# Patient Record
Sex: Female | Born: 1935 | Race: White | Hispanic: No | Smoking: Never smoker
Health system: Southern US, Community
[De-identification: ages and names within clinical notes are randomized; demographics above are authoritative.]

## PROBLEM LIST (undated history)

## (undated) DIAGNOSIS — D696 Thrombocytopenia, unspecified: Secondary | ICD-10-CM

## (undated) DIAGNOSIS — I350 Nonrheumatic aortic (valve) stenosis: Secondary | ICD-10-CM

## (undated) DIAGNOSIS — R011 Cardiac murmur, unspecified: Secondary | ICD-10-CM

## (undated) DIAGNOSIS — E7800 Pure hypercholesterolemia, unspecified: Secondary | ICD-10-CM

## (undated) DIAGNOSIS — J40 Bronchitis, not specified as acute or chronic: Secondary | ICD-10-CM

## (undated) DIAGNOSIS — I10 Essential (primary) hypertension: Secondary | ICD-10-CM

## (undated) HISTORY — PX: EYE SURGERY: SHX253

## (undated) HISTORY — PX: CARDIAC CATHETERIZATION: SHX172

---

## 1997-11-16 ENCOUNTER — Other Ambulatory Visit: Admission: RE | Admit: 1997-11-16 | Discharge: 1997-11-16 | Payer: Self-pay | Admitting: Family Medicine

## 2000-04-06 ENCOUNTER — Encounter: Payer: Self-pay | Admitting: Emergency Medicine

## 2000-04-06 ENCOUNTER — Emergency Department (HOSPITAL_COMMUNITY): Admission: EM | Admit: 2000-04-06 | Discharge: 2000-04-06 | Payer: Self-pay | Admitting: Emergency Medicine

## 2013-10-15 ENCOUNTER — Emergency Department (HOSPITAL_COMMUNITY)
Admission: EM | Admit: 2013-10-15 | Discharge: 2013-10-15 | Disposition: A | Discharge status: Home / Self Care | Payer: Medicare Other | Attending: Emergency Medicine | Admitting: Emergency Medicine

## 2013-10-15 ENCOUNTER — Emergency Department (HOSPITAL_COMMUNITY): Payer: Medicare Other

## 2013-10-15 DIAGNOSIS — Z79899 Other long term (current) drug therapy: Secondary | ICD-10-CM | POA: Insufficient documentation

## 2013-10-15 DIAGNOSIS — M79609 Pain in unspecified limb: Secondary | ICD-10-CM | POA: Insufficient documentation

## 2013-10-15 DIAGNOSIS — R011 Cardiac murmur, unspecified: Secondary | ICD-10-CM | POA: Insufficient documentation

## 2013-10-15 DIAGNOSIS — R42 Dizziness and giddiness: Secondary | ICD-10-CM | POA: Insufficient documentation

## 2013-10-15 LAB — CBC WITH DIFFERENTIAL/PLATELET
Basophils Absolute: 0 10*3/uL (ref 0.0–0.1)
Basophils Relative: 1 % (ref 0–1)
Eosinophils Absolute: 0 10*3/uL (ref 0.0–0.7)
Eosinophils Relative: 0 % (ref 0–5)
HEMATOCRIT: 44.5 % (ref 36.0–46.0)
HEMOGLOBIN: 15.6 g/dL — AB (ref 12.0–15.0)
LYMPHS PCT: 17 % (ref 12–46)
Lymphs Abs: 1.5 10*3/uL (ref 0.7–4.0)
MCH: 30.4 pg (ref 26.0–34.0)
MCHC: 35.1 g/dL (ref 30.0–36.0)
MCV: 86.7 fL (ref 78.0–100.0)
MONO ABS: 0.7 10*3/uL (ref 0.1–1.0)
MONOS PCT: 9 % (ref 3–12)
NEUTROS ABS: 6.4 10*3/uL (ref 1.7–7.7)
Neutrophils Relative %: 73 % (ref 43–77)
Platelets: 195 10*3/uL (ref 150–400)
RBC: 5.13 MIL/uL — AB (ref 3.87–5.11)
RDW: 12.7 % (ref 11.5–15.5)
WBC: 8.7 10*3/uL (ref 4.0–10.5)

## 2013-10-15 LAB — COMPREHENSIVE METABOLIC PANEL
ALBUMIN: 4.2 g/dL (ref 3.5–5.2)
ALT: 20 U/L (ref 0–35)
AST: 25 U/L (ref 0–37)
Alkaline Phosphatase: 59 U/L (ref 39–117)
BUN: 8 mg/dL (ref 6–23)
CALCIUM: 9.8 mg/dL (ref 8.4–10.5)
CO2: 26 mEq/L (ref 19–32)
Chloride: 99 mEq/L (ref 96–112)
Creatinine, Ser: 0.56 mg/dL (ref 0.50–1.10)
GFR calc Af Amer: >90 mL/min (ref >90)
GFR, EST NON AFRICAN AMERICAN: 85 mL/min — AB (ref >90)
Glucose, Bld: 136 mg/dL — ABNORMAL HIGH (ref 70–99)
Potassium: 3.4 mEq/L — ABNORMAL LOW (ref 3.7–5.3)
SODIUM: 141 meq/L (ref 137–147)
TOTAL PROTEIN: 7.2 g/dL (ref 6.0–8.3)
Total Bilirubin: 0.4 mg/dL (ref 0.3–1.2)

## 2013-10-15 LAB — URINE MICROSCOPIC-ADD ON

## 2013-10-15 LAB — URINALYSIS, ROUTINE W REFLEX MICROSCOPIC
Bilirubin Urine: NEGATIVE
GLUCOSE, UA: NEGATIVE mg/dL
Hgb urine dipstick: NEGATIVE
Ketones, ur: NEGATIVE mg/dL
Nitrite: NEGATIVE
PH: 6 (ref 5.0–8.0)
Protein, ur: NEGATIVE mg/dL
SPECIFIC GRAVITY, URINE: 1.024 (ref 1.005–1.030)
Urobilinogen, UA: 0.2 mg/dL (ref 0.0–1.0)

## 2013-10-15 LAB — I-STAT TROPONIN, ED: Troponin i, poc: 0.01 ng/mL (ref 0.00–0.08)

## 2013-10-15 NOTE — ED Provider Notes (Signed)
CSN: [id number]     Arrival date & time 10/15/13  1321 History   First MD Initiated Contact with Patient 10/15/13 1503     Chief Complaint  Patient presents with  . Dizziness  . Arm Pain     (Consider location/radiation/quality/duration/timing/severity/associated sxs/prior Treatment) Patient is a 80 y.o. female presenting with dizziness.  Dizziness Quality:  Lightheadedness Severity:  Moderate Onset quality:  Sudden Duration:  7 days Timing:  Intermittent Progression:  Unchanged Chronicity:  New Context comment:  Started initially while gardening Relieved by:  Lying down Exacerbated by: worse when she is standing up. Ineffective treatments:  None tried Associated symptoms: no chest pain, no diarrhea, no nausea, no shortness of breath, no syncope, no vision changes, no vomiting and no weakness     No past medical history on file. No past surgical history on file. No family history on file. History  Substance Use Topics  . Smoking status: Not on file  . Smokeless tobacco: Not on file  . Alcohol Use: Not on file   OB History   No data available     Review of Systems  Constitutional: Negative for fever.  HENT: Negative for congestion.   Respiratory: Negative for cough and shortness of breath.   Cardiovascular: Negative for chest pain and syncope.  Gastrointestinal: Negative for nausea, vomiting, abdominal pain and diarrhea.  Neurological: Positive for dizziness.  All other systems reviewed and are negative.      Allergies  Review of patient's allergies indicates no known allergies.  Home Medications   Current Outpatient Rx  Name  Route  Sig  Dispense  Refill  . Calcium Carbonate-Vitamin D (CALCIUM + D PO)   Oral   Take 1 tablet by mouth daily.         . hydrochlorothiazide (HYDRODIURIL) 25 MG tablet   Oral   Take 25 mg by mouth daily.         . Multiple Vitamin (MULTIVITAMIN WITH MINERALS) TABS tablet   Oral   Take 1 tablet by mouth daily.          . verapamil (CALAN-SR) 240 MG CR tablet   Oral   Take 240 mg by mouth 2 (two) times daily.         Marland Kitchen. VITAMIN E PO   Oral   Take 1 capsule by mouth daily.          BP 167/57  Pulse 89  Temp(Src) 97.9 F (36.6 C) (Oral)  Resp 16  Ht 5\' 1"  (1.549 m)  Wt 138 lb 4.8 oz (62.732 kg)  BMI 26.14 kg/m2  SpO2 98% Physical Exam  Nursing note and vitals reviewed. Constitutional: She is oriented to person, place, and time. She appears well-developed and well-nourished. No distress.  HENT:  Head: Normocephalic and atraumatic.  Mouth/Throat: Oropharynx is clear and moist.  Eyes: Conjunctivae are normal. Pupils are equal, round, and reactive to light. No scleral icterus.  Neck: Neck supple.  Cardiovascular: Normal rate, regular rhythm and intact distal pulses.   Murmur heard.  Systolic murmur is present with a grade of 3/6  Murmur heard best over right sternal border.  Murmur known to patient.  Pulmonary/Chest: Effort normal and breath sounds normal. No stridor. No respiratory distress. She has no rales.  Abdominal: Soft. Bowel sounds are normal. She exhibits no distension. There is no tenderness.  Musculoskeletal: Normal range of motion.  Neurological: She is alert and oriented to person, place, and time.  Skin: Skin is warm and  dry. No rash noted.  Psychiatric: She has a normal mood and affect. Her behavior is normal.    ED Course  Procedures (including critical care time) Labs Review Labs Reviewed  COMPREHENSIVE METABOLIC PANEL - Abnormal; Notable for the following:    Potassium 3.4 (*)    Glucose, Bld 136 (*)    GFR calc non Af Amer 85 (*)    All other components within normal limits  CBC WITH DIFFERENTIAL - Abnormal; Notable for the following:    RBC 5.13 (*)    Hemoglobin 15.6 (*)    All other components within normal limits  URINALYSIS, ROUTINE W REFLEX MICROSCOPIC  I-STAT TROPOININ, ED   Imaging Review Dg Chest 2 View  10/15/2013   CLINICAL DATA:  Dizziness.   Pain.  EXAM: CHEST  2 VIEW  COMPARISON:  None.  FINDINGS: The heart size and mediastinal contours are within normal limits. Both lungs are clear. The visualized skeletal structures are unremarkable.  IMPRESSION: No active cardiopulmonary disease.   Electronically Signed   By: Maisie Fus  Register   On: 10/15/2013 14:23  All radiology studies independently viewed by me.      EKG Interpretation   Date/Time:  Wednesday October 15 2013 13:31:05 EDT Ventricular Rate:  92 PR Interval:  168 QRS Duration: 92 QT Interval:  356 QTC Calculation: 440 R Axis:   -4 Text Interpretation:  Normal sinus rhythm Septal infarct , age  undetermined Abnormal ECG No old tracing to compare Confirmed by [facility]   MD, TREY ([id number]) on 10/15/2013 5:06:26 PM      MDM   Final diagnoses:  Dizziness    80 yo female with lightheadedness off and on for about a week.  She has not passed out.  She denies any other associated symptoms except for intermittent left hand numbness which she thinks is brought on by certain positions while sleeping.  No numbness now.  Exam notable for very well appearance and systolic murmur.  No old echo's on file, but have spoken to PCPs office and will get last clinic note.  Have secured her a follow up appointment for 10/21/2013.    Her EKG, blood work, urinalysis were unremarkable. She remained well appearing. I obtained outside records which indicate a history of aortic stenosis with no recent echo. I discussed her case with Dr. Anne Fu (cardiology), who will help facilitate close outpatient echocardiogram and cardiology followup. She agrees to return to the emergency department if her symptoms worsen, she develops syncope, or develops other concerning symptoms.  Candyce Churn III, MD 10/15/13 [phone]

## 2013-10-15 NOTE — ED Notes (Signed)
Pt alert x4 respirations easy non labored. Ambulates without distress.  

## 2013-10-15 NOTE — Discharge Instructions (Signed)

## 2013-10-15 NOTE — ED Notes (Signed)
Dizziness started about 7 days ago and  No nausea or vomiting  Not as bad when she lays down left arm  Numbness  For a long time worse in the last few days  No slurred speech or h/a

## 2013-10-15 NOTE — ED Notes (Signed)
Pt ambulated with steady gait to bathroom. Family at bedside. Pt denies complaints at this time.Respirations easy non labored.

## 2013-10-17 ENCOUNTER — Telehealth (HOSPITAL_BASED_OUTPATIENT_CLINIC_OR_DEPARTMENT_OTHER): Payer: Self-pay

## 2013-10-17 NOTE — Telephone Encounter (Signed)
Call w/pt and grandson Sara Donaldson.  Per pt okay yo talk w/grandson about scheduling appt w/cardiologist & an echocardiogram.  Per ED MD EPIC note he spoke w/ Dr Anne FuSkains and he would f/u w/echocardiogram and cardiology f/u.  [facility]  Grandson called back earliest they could see pt was end of April.  FM called [facility] to try to expedite visit and was told they didn't have outpt order for echocardiogram.  ED PA put out pt echocardiogram order in EPIC.  FM called [facility] and earliest appt for test 4/15 @ 3 pm.  Test will need to be prior authed FM to call pts PCP Mon and have them do.  Grandson/pt informed of earliest appt time.  Alternative cardiologist provided.

## 2013-10-20 ENCOUNTER — Emergency Department (HOSPITAL_COMMUNITY): Payer: Medicare Other

## 2013-10-20 ENCOUNTER — Encounter (HOSPITAL_COMMUNITY): Payer: Self-pay | Admitting: Emergency Medicine

## 2013-10-20 ENCOUNTER — Emergency Department (HOSPITAL_COMMUNITY)
Admission: EM | Admit: 2013-10-20 | Discharge: 2013-10-20 | Disposition: A | Discharge status: Home / Self Care | Payer: Medicare Other | Attending: Emergency Medicine | Admitting: Emergency Medicine

## 2013-10-20 DIAGNOSIS — Z79899 Other long term (current) drug therapy: Secondary | ICD-10-CM | POA: Insufficient documentation

## 2013-10-20 DIAGNOSIS — Z7982 Long term (current) use of aspirin: Secondary | ICD-10-CM | POA: Insufficient documentation

## 2013-10-20 DIAGNOSIS — R42 Dizziness and giddiness: Secondary | ICD-10-CM | POA: Insufficient documentation

## 2013-10-20 DIAGNOSIS — I359 Nonrheumatic aortic valve disorder, unspecified: Secondary | ICD-10-CM | POA: Insufficient documentation

## 2013-10-20 DIAGNOSIS — I350 Nonrheumatic aortic (valve) stenosis: Secondary | ICD-10-CM

## 2013-10-20 DIAGNOSIS — R5381 Other malaise: Secondary | ICD-10-CM | POA: Insufficient documentation

## 2013-10-20 DIAGNOSIS — I10 Essential (primary) hypertension: Secondary | ICD-10-CM | POA: Insufficient documentation

## 2013-10-20 DIAGNOSIS — R5383 Other fatigue: Secondary | ICD-10-CM

## 2013-10-20 DIAGNOSIS — R55 Syncope and collapse: Secondary | ICD-10-CM | POA: Insufficient documentation

## 2013-10-20 HISTORY — DX: Essential (primary) hypertension: I10

## 2013-10-20 LAB — CBC
HCT: 42 % (ref 36.0–46.0)
Hemoglobin: 14.7 g/dL (ref 12.0–15.0)
MCH: 30.9 pg (ref 26.0–34.0)
MCHC: 35 g/dL (ref 30.0–36.0)
MCV: 88.2 fL (ref 78.0–100.0)
PLATELETS: 181 10*3/uL (ref 150–400)
RBC: 4.76 MIL/uL (ref 3.87–5.11)
RDW: 12.8 % (ref 11.5–15.5)
WBC: 6 10*3/uL (ref 4.0–10.5)

## 2013-10-20 LAB — BASIC METABOLIC PANEL
BUN: 10 mg/dL (ref 6–23)
CO2: 27 mEq/L (ref 19–32)
CREATININE: 0.64 mg/dL (ref 0.50–1.10)
Calcium: 9.4 mg/dL (ref 8.4–10.5)
Chloride: 100 mEq/L (ref 96–112)
GFR, EST NON AFRICAN AMERICAN: 81 mL/min — AB (ref >90)
Glucose, Bld: 101 mg/dL — ABNORMAL HIGH (ref 70–99)
POTASSIUM: 3.8 meq/L (ref 3.7–5.3)
Sodium: 142 mEq/L (ref 137–147)

## 2013-10-20 LAB — I-STAT CHEM 8, ED
BUN: 9 mg/dL (ref 6–23)
CALCIUM ION: 1.19 mmol/L (ref 1.13–1.30)
CREATININE: 0.8 mg/dL (ref 0.50–1.10)
Chloride: 98 mEq/L (ref 96–112)
Glucose, Bld: 101 mg/dL — ABNORMAL HIGH (ref 70–99)
HCT: 43 % (ref 36.0–46.0)
Hemoglobin: 14.6 g/dL (ref 12.0–15.0)
Potassium: 3.5 mEq/L — ABNORMAL LOW (ref 3.7–5.3)
Sodium: 140 mEq/L (ref 137–147)
TCO2: 27 mmol/L (ref 0–100)

## 2013-10-20 LAB — I-STAT TROPONIN, ED: Troponin i, poc: 0.02 ng/mL (ref 0.00–0.08)

## 2013-10-20 NOTE — ED Notes (Signed)
Patient transported to CT 

## 2013-10-20 NOTE — ED Notes (Signed)
MD at bedside. 

## 2013-10-20 NOTE — Discharge Instructions (Signed)
Followup with the cardiology for your echocardiogram tomorrow as arranged. Return for any newer worse symptoms. Today head CT was negative chest x-ray was negative and labs without any significant abnormalities. Echocardiogram is important to further evaluate the aortic stenosis.

## 2013-10-20 NOTE — ED Notes (Signed)
Pt had a near syncopal event today, has a heart murmur and feels lightheaded which she has been for the past couple of days. Is scheduled for an echo today at 3 but didn't feel as though she could wait that long. Wanted to be seen before then. Denies any N/V/cp.

## 2013-10-20 NOTE — ED Provider Notes (Signed)
CSN: [id number]     Arrival date & time 10/20/13  1401 History   First MD Initiated Contact with Patient 10/20/13 1513     Chief Complaint  Patient presents with  . Near Syncope     (Consider location/radiation/quality/duration/timing/severity/associated sxs/prior Treatment) Patient is a 80 y.o. female presenting with near-syncope. The history is provided by the patient and a relative.  Near Syncope Pertinent negatives include no chest pain, no abdominal pain, no headaches and no shortness of breath.   patient seen here on April 1 for same symptoms. Was referred to cardiology for echocardiogram. Patient has past history of aortic stenosis. But has not had followup echoes. Patient was on her way to her cardiology visit when she stopped to get lunch at family members whether she had a feeling of near-syncope. This is been ongoing at least for 2 weeks according to the patient probably longer. Patient denies chest pain shortness of breath or headache. No focal findings. Has felt fatigued. Has not actually had a syncopal episode. Patient was told many years ago that she had aortic stenosis has not had followup echocardiogram that was diagnosed on initial echocardiogram. Patient [place]. [place] in the [place]. Her appointment with cardiology today was a new appointment.  Past Medical History  Diagnosis Date  . Hypertension    History reviewed. No pertinent past surgical history. No family history on file. History  Substance Use Topics  . Smoking status: Never Smoker   . Smokeless tobacco: Not on file  . Alcohol Use: No   OB History   Grav Para Term Preterm Abortions TAB SAB Ect Mult Living                 Review of Systems  Constitutional: Positive for fatigue.  HENT: Negative for congestion.   Eyes: Negative for redness.  Respiratory: Negative for shortness of breath.   Cardiovascular: Positive for near-syncope. Negative for chest pain.  Gastrointestinal:  Negative for abdominal pain.  Genitourinary: Negative for dysuria.  Musculoskeletal: Negative for back pain.  Skin: Negative for rash.  Neurological: Positive for dizziness and light-headedness. Negative for facial asymmetry, speech difficulty, weakness, numbness and headaches.  Hematological: Does not bruise/bleed easily.  Psychiatric/Behavioral: Negative for confusion.      Allergies  Review of patient's allergies indicates no known allergies.  Home Medications   Current Outpatient Rx  Name  Route  Sig  Dispense  Refill  . Ascorbic Acid (VITAMIN C PO)   Oral   Take 1 drop by mouth 2 (two) times daily.         Marland Kitchen aspirin EC 81 MG tablet   Oral   Take 81 mg by mouth daily.         . Calcium Carb-Cholecalciferol (CALCIUM 600+D3) 600-800 MG-UNIT TABS   Oral   Take 1 tablet by mouth 2 (two) times daily.         Marland Kitchen doxazosin (CARDURA) 2 MG tablet   Oral   Take 1 mg by mouth daily.         Marland Kitchen lovastatin (MEVACOR) 20 MG tablet   Oral   Take 20 mg by mouth 2 (two) times daily.         . Multiple Vitamin (MULTIVITAMIN WITH MINERALS) TABS tablet   Oral   Take 1 tablet by mouth every evening.          . verapamil (CALAN-SR) 240 MG CR tablet   Oral   Take 480 mg  by mouth every morning.          . vitamin E 400 UNIT capsule   Oral   Take 400 Units by mouth every evening.          . hydrochlorothiazide (HYDRODIURIL) 25 MG tablet   Oral   Take 25 mg by mouth daily.          BP 131/43  Pulse 72  Temp(Src) 97.8 F (36.6 C) (Oral)  Resp 20  Wt 138 lb (62.596 kg)  SpO2 96% Physical Exam  Nursing note and vitals reviewed. Constitutional: She is oriented to person, place, and time. She appears well-developed and well-nourished. No distress.  HENT:  Head: Normocephalic and atraumatic.  Mouth/Throat: Oropharynx is clear and moist.  Eyes: Conjunctivae and EOM are normal. Pupils are equal, round, and reactive to light.  Neck: Normal range of motion.   Cardiovascular: Normal rate, regular rhythm and normal heart sounds.   No murmur heard. Pulmonary/Chest: Effort normal and breath sounds normal. No respiratory distress.  Abdominal: Soft. Bowel sounds are normal. There is no tenderness.  Musculoskeletal: Normal range of motion. She exhibits no edema.  Neurological: She is alert and oriented to person, place, and time. No cranial nerve deficit. She exhibits normal muscle tone. Coordination normal.  Skin: Skin is warm. No rash noted.    ED Course  Procedures (including critical care time) Labs Review Labs Reviewed  BASIC METABOLIC PANEL - Abnormal; Notable for the following:    Glucose, Bld 101 (*)    GFR calc non Af Amer 81 (*)    All other components within normal limits  I-STAT CHEM 8, ED - Abnormal; Notable for the following:    Potassium 3.5 (*)    Glucose, Bld 101 (*)    All other components within normal limits  CBC  I-STAT TROPOININ, ED   Imaging Review Dg Chest 2 View  10/20/2013   CLINICAL DATA:  Syncope  EXAM: CHEST  2 VIEW  COMPARISON:  10/15/2013  FINDINGS: Cardiomediastinal silhouette is stable. No acute infiltrate or pleural effusion. No pulmonary edema. Bony thorax is unremarkable. Atherosclerotic calcifications of thoracic aorta.  IMPRESSION: No active cardiopulmonary disease.   Electronically Signed   By: Natasha MeadLiviu  Pop M.D.   On: 10/20/2013 16:58   Ct Head Wo Contrast  10/20/2013   CLINICAL DATA:  Near syncope.  EXAM: CT HEAD WITHOUT CONTRAST  TECHNIQUE: Contiguous axial images were obtained from the base of the skull through the vertex without intravenous contrast.  COMPARISON:  None.  FINDINGS: Ventricles are normal in configuration. There is age related ventricular and sulcal enlargement.  There is patchy white matter hypoattenuation consistent with moderate to advanced chronic microvascular ischemic change. No parenchymal masses or mass effect. There is no evidence of a recent cortical infarct.  There are no extra-axial  masses or abnormal fluid collections.  No intracranial hemorrhage.  Visualized sinuses and mastoid air cells are clear. No skull lesion.  IMPRESSION: 1. No acute intracranial abnormalities. 2. Age related volume loss. 3. Moderate to advanced chronic microvascular ischemic change.   Electronically Signed   By: Amie Portlandavid  Ormond M.D.   On: 10/20/2013 18:28     EKG Interpretation   Date/Time:  Monday October 20 2013 14:06:07 EDT Ventricular Rate:  86 PR Interval:  162 QRS Duration: 90 QT Interval:  364 QTC Calculation: 435 R Axis:   34 Text Interpretation:  Normal sinus rhythm Anteroseptal infarct , age  undetermined Abnormal ECG No significant change since last  tracing  Confirmed by Deretha Emory  MD, Kealohilani Maiorino [id number]) on 10/20/2013 4:26:54 PM      MDM   Final diagnoses:  Near syncope  Aortic stenosis    Workup today without any new or worse symptoms. Patient's EKG without any significant arrhythmias. Labs without significant abnormalities. Chest x-ray was also negative for pneumonia or pulmonary edema. Patient's workup included a head CT this time. No evidence of past stroke. Patient has echocardiogram arranged with cardiology tomorrow. Unable to get that done this evening. Family members will stay with her. Followup with cardiology is important to evaluate the history of aortic stenosis.    Shelda Jakes, MD 10/20/13 [phone]

## 2013-10-23 ENCOUNTER — Other Ambulatory Visit: Payer: Self-pay | Admitting: Cardiovascular Disease

## 2013-10-23 ENCOUNTER — Encounter (HOSPITAL_COMMUNITY): Payer: Self-pay | Admitting: Pharmacy Technician

## 2013-10-24 ENCOUNTER — Ambulatory Visit (HOSPITAL_COMMUNITY)
Admission: RE | Admit: 2013-10-24 | Discharge: 2013-10-24 | Disposition: A | Discharge status: Home / Self Care | Payer: Medicare Other | Source: Ambulatory Visit | Attending: Cardiovascular Disease | Admitting: Cardiovascular Disease

## 2013-10-24 ENCOUNTER — Encounter (HOSPITAL_COMMUNITY)
Admission: RE | Disposition: A | Discharge status: Home / Self Care | Payer: Self-pay | Source: Ambulatory Visit | Attending: Cardiovascular Disease

## 2013-10-24 DIAGNOSIS — R0989 Other specified symptoms and signs involving the circulatory and respiratory systems: Secondary | ICD-10-CM | POA: Insufficient documentation

## 2013-10-24 DIAGNOSIS — Z7982 Long term (current) use of aspirin: Secondary | ICD-10-CM | POA: Insufficient documentation

## 2013-10-24 DIAGNOSIS — R0789 Other chest pain: Secondary | ICD-10-CM | POA: Insufficient documentation

## 2013-10-24 DIAGNOSIS — I10 Essential (primary) hypertension: Secondary | ICD-10-CM | POA: Insufficient documentation

## 2013-10-24 DIAGNOSIS — I359 Nonrheumatic aortic valve disorder, unspecified: Secondary | ICD-10-CM | POA: Insufficient documentation

## 2013-10-24 DIAGNOSIS — R0609 Other forms of dyspnea: Secondary | ICD-10-CM | POA: Insufficient documentation

## 2013-10-24 HISTORY — PX: LEFT HEART CATHETERIZATION WITH CORONARY ANGIOGRAM: SHX5451

## 2013-10-24 LAB — POCT I-STAT 3, ART BLOOD GAS (G3+)
Acid-Base Excess: 3 mmol/L — ABNORMAL HIGH (ref 0.0–2.0)
Bicarbonate: 27.8 mEq/L — ABNORMAL HIGH (ref 20.0–24.0)
O2 SAT: 90 %
PCO2 ART: 43.7 mmHg (ref 35.0–45.0)
TCO2: 29 mmol/L (ref 0–100)
pH, Arterial: 7.412 (ref 7.350–7.450)
pO2, Arterial: 59 mmHg — ABNORMAL LOW (ref 80.0–100.0)

## 2013-10-24 LAB — POCT I-STAT 3, VENOUS BLOOD GAS (G3P V)
Bicarbonate: 26.1 mEq/L — ABNORMAL HIGH (ref 20.0–24.0)
O2 SAT: 59 %
PO2 VEN: 32 mmHg (ref 30.0–45.0)
TCO2: 27 mmol/L (ref 0–100)
pCO2, Ven: 46.2 mmHg (ref 45.0–50.0)
pH, Ven: 7.36 — ABNORMAL HIGH (ref 7.250–7.300)

## 2013-10-24 LAB — BASIC METABOLIC PANEL
BUN: 10 mg/dL (ref 6–23)
CO2: 28 mEq/L (ref 19–32)
Calcium: 10.2 mg/dL (ref 8.4–10.5)
Chloride: 99 mEq/L (ref 96–112)
Creatinine, Ser: 0.64 mg/dL (ref 0.50–1.10)
GFR calc Af Amer: >90 mL/min (ref >90)
GFR calc non Af Amer: 81 mL/min — ABNORMAL LOW (ref >90)
Glucose, Bld: 104 mg/dL — ABNORMAL HIGH (ref 70–99)
Potassium: 3.1 mEq/L — ABNORMAL LOW (ref 3.7–5.3)
Sodium: 142 mEq/L (ref 137–147)

## 2013-10-24 LAB — PROTIME-INR
INR: 0.98 (ref 0.00–1.49)
Prothrombin Time: 12.8 seconds (ref 11.6–15.2)

## 2013-10-24 LAB — CBC
HCT: 45.2 % (ref 36.0–46.0)
Hemoglobin: 15.7 g/dL — ABNORMAL HIGH (ref 12.0–15.0)
MCH: 30.3 pg (ref 26.0–34.0)
MCHC: 34.7 g/dL (ref 30.0–36.0)
MCV: 87.1 fL (ref 78.0–100.0)
Platelets: 182 10*3/uL (ref 150–400)
RBC: 5.19 MIL/uL — ABNORMAL HIGH (ref 3.87–5.11)
RDW: 12.8 % (ref 11.5–15.5)
WBC: 5.8 10*3/uL (ref 4.0–10.5)

## 2013-10-24 SURGERY — LEFT HEART CATHETERIZATION WITH CORONARY ANGIOGRAM
Anesthesia: LOCAL

## 2013-10-24 MED ORDER — SODIUM CHLORIDE 0.9 % IJ SOLN: 3.0000 mL | Freq: Two times a day (BID) | INTRAMUSCULAR | Status: DC

## 2013-10-24 MED ORDER — ASPIRIN 81 MG PO CHEW
81.0000 mg | CHEWABLE_TABLET | ORAL | Status: AC
  Administered 2013-10-24: 81 mg via ORAL
  Filled 2013-10-24: qty 1

## 2013-10-24 MED ORDER — SODIUM CHLORIDE 0.9 % IV SOLN
INTRAVENOUS | Status: DC
  Administered 2013-10-24: 06:00:00 via INTRAVENOUS

## 2013-10-24 MED ORDER — MIDAZOLAM HCL 2 MG/2ML IJ SOLN
INTRAMUSCULAR | Status: AC
Start: 2013-10-24 — End: 2013-10-24
  Filled 2013-10-24: qty 2

## 2013-10-24 MED ORDER — FENTANYL CITRATE 0.05 MG/ML IJ SOLN
INTRAMUSCULAR | Status: AC
  Filled 2013-10-24: qty 2

## 2013-10-24 MED ORDER — SODIUM CHLORIDE 0.9 % IV SOLN: 1.0000 mL/kg/h | INTRAVENOUS | Status: DC

## 2013-10-24 MED ORDER — SODIUM CHLORIDE 0.9 % IV SOLN: 250.0000 mL | INTRAVENOUS | Status: DC | PRN

## 2013-10-24 MED ORDER — POTASSIUM CHLORIDE CRYS ER 20 MEQ PO TBCR
EXTENDED_RELEASE_TABLET | ORAL | Status: AC
  Filled 2013-10-24: qty 2

## 2013-10-24 MED ORDER — ACETAMINOPHEN 325 MG PO TABS: 650.0000 mg | ORAL_TABLET | ORAL | Status: DC | PRN

## 2013-10-24 MED ORDER — POTASSIUM CHLORIDE CRYS ER 20 MEQ PO TBCR
40.0000 meq | EXTENDED_RELEASE_TABLET | Freq: Once | ORAL | Status: AC
  Administered 2013-10-24: 40 meq via ORAL

## 2013-10-24 MED ORDER — ONDANSETRON HCL 4 MG/2ML IJ SOLN
4.0000 mg | Freq: Four times a day (QID) | INTRAMUSCULAR | Status: DC | PRN
Start: 2013-10-24 — End: 2013-10-24

## 2013-10-24 MED ORDER — NITROGLYCERIN 0.2 MG/ML ON CALL CATH LAB
INTRAVENOUS | Status: AC
  Filled 2013-10-24: qty 1

## 2013-10-24 MED ORDER — HEPARIN (PORCINE) IN NACL 2-0.9 UNIT/ML-% IJ SOLN
INTRAMUSCULAR | Status: AC
Start: 2013-10-24 — End: 2013-10-24
  Filled 2013-10-24: qty 1000

## 2013-10-24 MED ORDER — SODIUM CHLORIDE 0.9 % IJ SOLN: 3.0000 mL | INTRAMUSCULAR | Status: DC | PRN

## 2013-10-24 MED ORDER — DOXAZOSIN MESYLATE 1 MG PO TABS: 1.0000 mg | ORAL_TABLET | Freq: Every day | ORAL | Status: DC

## 2013-10-24 MED ORDER — LIDOCAINE HCL (PF) 1 % IJ SOLN
INTRAMUSCULAR | Status: AC
  Filled 2013-10-24: qty 30

## 2013-10-24 NOTE — Interval H&P Note (Signed)
History and Physical Interval Note:  10/24/2013 6:31 AM  Sara Donaldson  has presented today for surgery, with the diagnosis of CP  The various methods of treatment have been discussed with the patient and family. After consideration of risks, benefits and other options for treatment, the patient has consented to  Procedure(s): LEFT HEART CATHETERIZATION WITH CORONARY ANGIOGRAM (N/A) as a surgical intervention .  The patient's history has been reviewed, patient examined, no change in status, stable for surgery.  I have reviewed the patient's chart and labs.  Questions were answered to the patient's satisfaction.     Ricki RodriguezAjay S Maciah Schweigert

## 2013-10-24 NOTE — H&P (Signed)
Referring Physician: Dr. Lonie PeakNathan Donaldson.   Sara Donaldson is an 80 y.o. female.                       Chief Complaint: Atypical chest pain HPI: 80 year old female with 2-3 week history of light headedness, exertional dyspnea and left arm numbness. Echocardiogram in office showed moderate to severe aortic stenosis.  Past Medical History  Diagnosis Date  . Hypertension       No past surgical history on file.  Family history : Mom died of MI age 78. Dad died of CHF age 78. She has one brother and 2 sisters living and well.. Social History:  reports that she has never smoked. She does not have any smokeless tobacco history on file. She reports that she does not drink alcohol or use illicit drugs. Husband died of cancer of lung. Had 2 sons, one died of alcohol complication age 78 in [id number]. Has 2 daughters, living and well. She is retired Film/video editoroffice worker.  Allergies: No Known Allergies  Medications Prior to Admission  Medication Sig Dispense Refill  . Ascorbic Acid (VITAMIN C PO) Take 1 tablet by mouth 2 (two) times daily.       Marland Kitchen. aspirin EC 81 MG tablet Take 81 mg by mouth daily.      . Calcium Carb-Cholecalciferol (CALCIUM 600+D3) 600-800 MG-UNIT TABS Take 1 tablet by mouth 2 (two) times daily.      Marland Kitchen. doxazosin (CARDURA) 2 MG tablet Take 1 mg by mouth daily.      . hydrochlorothiazide (HYDRODIURIL) 25 MG tablet Take 25 mg by mouth daily.      Marland Kitchen. lovastatin (MEVACOR) 20 MG tablet Take 20 mg by mouth 2 (two) times daily.      . Multiple Vitamin (MULTIVITAMIN WITH MINERALS) TABS tablet Take 1 tablet by mouth every evening.       . niacin 500 MG tablet Take 500 mg by mouth 2 (two) times daily.      . verapamil (CALAN-SR) 240 MG CR tablet Take 240 mg by mouth 2 (two) times daily.       . vitamin E 400 UNIT capsule Take 400 Units by mouth every evening.         Results for orders placed during the hospital encounter of 10/24/13 (from the past 48 hour(s))  CBC     Status: Abnormal   Collection Time     10/24/13  5:44 AM      Result Value Ref Range   WBC 5.8  4.0 - 10.5 K/uL   RBC 5.19 (*) 3.87 - 5.11 MIL/uL   Hemoglobin 15.7 (*) 12.0 - 15.0 g/dL   HCT 40.945.2  81.136.0 - 91.446.0 %   MCV 87.1  78.0 - 100.0 fL   MCH 30.3  26.0 - 34.0 pg   MCHC 34.7  30.0 - 36.0 g/dL   RDW 78.212.8  95.611.5 - 21.315.5 %   Platelets 182  150 - 400 K/uL   No results found.  Review Of System No weight gain or loss, + dyspnea, + anxiety, + arthritis, No GI or GU bleed. No stroke, seizures ior psych admission.  Blood pressure 148/60, pulse 85, temperature 98.1 F (36.7 C), temperature source Oral, resp. rate 18, height 5\' 1"  (1.549 m), weight 62.596 kg (138 lb), SpO2 97.00%. HEENT: Rennert/ATBlue eyes, Conj-pink, Sclera-white, PERL, EOMI. Bilateral Lens implants. Throat- pink, midline tongue. Neck: No JVD, bruit or thyromegaly. Lungs: Clear. Heart: Normal S1  and S2. IV/VI systolic and II/VI diastolic murmur. Abdomen; Soft. Ext: No E/C/C. 2 + pulses. CNS: Moves all 4 extremities. Bilateral equal grips.  Skin: warm and dry.  Assessment/Plan Atypical chest pain Exertional dyspnea Pre-syncope Moderate to severe AS  Cardiac cath today. Patient understood risks and alternatives.  Ricki Rodriguez 10/24/2013, 6:12 AM

## 2013-10-24 NOTE — CV Procedure (Signed)
PROCEDURE:  Right and Left heart catheterization with selective coronary angiography, left ventriculogram.  CLINICAL HISTORY:  This is a 80 year old female with atypical chest pain, left arm numbness and moderate to severe AS.Marland Kitchen.  The risks, benefits, and details of the procedure were explained to the patient.  The patient verbalized understanding and wanted to proceed.  Informed written consent was obtained.  PROCEDURE TECHNIQUE:  The patient was approached from the right femoral vein using 7 French short sheath and right femoral artery using a 5 French short sheath. Right heart pressures, cardiac output and oxygen saturation were measured using a Swan Ganz catheter placed through right femoral vein. Left coronary angiography was done using a Judkins L4 guide catheter.  Right coronary angiography was done using a Judkins R4 guide catheter. Left ventriculography was done using a pigtail catheter.    CONTRAST:  Total of 50 cc.  COMPLICATIONS:  None.  At the end of the procedure a manual pressure was used for hemostasis.    HEMODYNAMICS:  Pulmonary pressure was 26/10: Wedge pressure was : RV pressure was 20/0: RA pressure was 6/2.   Oxygen saturation of blood sample from PA was 59 % and AO was 90 %.  Aortic pressure was 147/58; LV pressure was 174/3; LVEDP 8.  There was 31 mm gradient between the left ventricle and aorta. AV area was 0.95 cm2.  Cardiac output was 3.26 L/min by Fick method and 4.13 L/Min by Thermodilution.  ANGIOGRAM/CORONARY ARTERIOGRAM:   The left main coronary artery is unremarkable.  The left anterior descending artery is unremarkable. Diagonal is unremarkable.  The left circumflex artery has mild (20-30 %) proximal disease. OM is unremarkable.  The right coronary artery is dominant and has mild calcification, It has mild (20 %) proximal disease  LEFT VENTRICULOGRAM:  Left ventricular angiogram was done in the 30 RAO projection and revealed normal left ventricular wall  motion and systolic function with an estimated ejection fraction of 70 %.  LVEDP was 8 mmHg. Significant calcific AV.  IMPRESSION OF HEART CATHETERIZATION:   1. Normal left main coronary artery. 2. Normal left anterior descending artery and its branches. 3. Mild disease of left circumflex artery and its branches. 4. Mild disease right coronary artery. 5. Normal left ventricular systolic function.  LVEDP 8 mmHg.  Ej. fraction  70 %. 6. Normal PA and wedge pressures. 7.  Moderate AS.  RECOMMENDATION:   Medical treatment. If symptoms persist then TEE for AV evaluation.

## 2013-10-24 NOTE — Discharge Instructions (Signed)

## 2013-10-29 ENCOUNTER — Other Ambulatory Visit (HOSPITAL_COMMUNITY): Payer: BLUE CROSS/BLUE SHIELD

## 2013-11-07 ENCOUNTER — Other Ambulatory Visit (HOSPITAL_COMMUNITY): Payer: Self-pay

## 2013-11-07 DIAGNOSIS — R0602 Shortness of breath: Secondary | ICD-10-CM

## 2013-11-12 ENCOUNTER — Ambulatory Visit (HOSPITAL_COMMUNITY)
Admission: RE | Admit: 2013-11-12 | Discharge: 2013-11-12 | Disposition: A | Discharge status: Home / Self Care | Payer: Medicare Other | Source: Ambulatory Visit | Attending: Physician Assistant | Admitting: Physician Assistant

## 2013-11-12 DIAGNOSIS — R0602 Shortness of breath: Secondary | ICD-10-CM | POA: Insufficient documentation

## 2013-11-12 MED ORDER — ALBUTEROL SULFATE (2.5 MG/3ML) 0.083% IN NEBU
2.5000 mg | INHALATION_SOLUTION | Freq: Once | RESPIRATORY_TRACT | Status: AC
  Administered 2013-11-12: 2.5 mg via RESPIRATORY_TRACT

## 2013-11-13 LAB — PULMONARY FUNCTION TEST
FEF 25-75 Post: 1.9 L/sec
FEF 25-75 Pre: 2.31 L/sec
FEF2575-%CHANGE-POST: -17 %
FEF2575-%PRED-POST: 166 %
FEF2575-%Pred-Pre: 202 %
FEV1-%Change-Post: -3 %
FEV1-%PRED-POST: 92 %
FEV1-%PRED-PRE: 96 %
FEV1-POST: 1.48 L
FEV1-Pre: 1.54 L
FEV1FVC-%CHANGE-POST: -1 %
FEV1FVC-%Pred-Pre: 117 %
FEV6-%Change-Post: -1 %
FEV6-%PRED-PRE: 87 %
FEV6-%Pred-Post: 85 %
FEV6-Post: 1.74 L
FEV6-Pre: 1.77 L
FEV6FVC-%PRED-POST: 106 %
FEV6FVC-%Pred-Pre: 106 %
FVC-%CHANGE-POST: -2 %
FVC-%Pred-Post: 80 %
FVC-%Pred-Pre: 82 %
FVC-PRE: 1.79 L
FVC-Post: 1.74 L
POST FEV1/FVC RATIO: 85 %
PRE FEV1/FVC RATIO: 86 %
PRE FEV6/FVC RATIO: 100 %
Post FEV6/FVC ratio: 100 %

## 2014-01-06 ENCOUNTER — Other Ambulatory Visit: Payer: Self-pay | Admitting: Cardiovascular Disease

## 2014-01-09 ENCOUNTER — Encounter (HOSPITAL_COMMUNITY): Payer: Self-pay | Admitting: Pharmacy Technician

## 2014-01-12 ENCOUNTER — Encounter (HOSPITAL_COMMUNITY)
Admission: RE | Disposition: A | Discharge status: Home / Self Care | Payer: Self-pay | Source: Ambulatory Visit | Attending: Cardiovascular Disease

## 2014-01-12 ENCOUNTER — Ambulatory Visit (HOSPITAL_COMMUNITY)
Admission: RE | Admit: 2014-01-12 | Discharge: 2014-01-12 | Disposition: A | Discharge status: Home / Self Care | Payer: Medicare Other | Source: Ambulatory Visit | Attending: Cardiovascular Disease | Admitting: Cardiovascular Disease

## 2014-01-12 ENCOUNTER — Encounter (HOSPITAL_COMMUNITY): Payer: Self-pay | Admitting: Gastroenterology

## 2014-01-12 DIAGNOSIS — I079 Rheumatic tricuspid valve disease, unspecified: Secondary | ICD-10-CM | POA: Insufficient documentation

## 2014-01-12 DIAGNOSIS — I10 Essential (primary) hypertension: Secondary | ICD-10-CM | POA: Insufficient documentation

## 2014-01-12 DIAGNOSIS — I700 Atherosclerosis of aorta: Secondary | ICD-10-CM | POA: Insufficient documentation

## 2014-01-12 DIAGNOSIS — I77819 Aortic ectasia, unspecified site: Secondary | ICD-10-CM | POA: Insufficient documentation

## 2014-01-12 DIAGNOSIS — Z7982 Long term (current) use of aspirin: Secondary | ICD-10-CM | POA: Insufficient documentation

## 2014-01-12 DIAGNOSIS — I379 Nonrheumatic pulmonary valve disorder, unspecified: Secondary | ICD-10-CM | POA: Insufficient documentation

## 2014-01-12 DIAGNOSIS — I080 Rheumatic disorders of both mitral and aortic valves: Secondary | ICD-10-CM | POA: Insufficient documentation

## 2014-01-12 HISTORY — PX: TEE WITHOUT CARDIOVERSION: SHX5443

## 2014-01-12 SURGERY — ECHOCARDIOGRAM, TRANSESOPHAGEAL
Anesthesia: Moderate Sedation

## 2014-01-12 MED ORDER — MIDAZOLAM HCL 10 MG/2ML IJ SOLN
INTRAMUSCULAR | Status: DC | PRN
  Administered 2014-01-12: 2 mg via INTRAVENOUS
  Administered 2014-01-12: 1 mg via INTRAVENOUS

## 2014-01-12 MED ORDER — BUTAMBEN-TETRACAINE-BENZOCAINE 2-2-14 % EX AERO
INHALATION_SPRAY | CUTANEOUS | Status: DC | PRN
  Administered 2014-01-12: 2 via TOPICAL

## 2014-01-12 MED ORDER — FENTANYL CITRATE 0.05 MG/ML IJ SOLN
INTRAMUSCULAR | Status: AC
  Filled 2014-01-12: qty 2

## 2014-01-12 MED ORDER — SODIUM CHLORIDE 0.9 % IV SOLN
INTRAVENOUS | Status: DC
  Administered 2014-01-12: 500 mL via INTRAVENOUS

## 2014-01-12 MED ORDER — DIPHENHYDRAMINE HCL 50 MG/ML IJ SOLN
INTRAMUSCULAR | Status: AC
  Filled 2014-01-12: qty 1

## 2014-01-12 MED ORDER — FENTANYL CITRATE 0.05 MG/ML IJ SOLN
INTRAMUSCULAR | Status: DC | PRN
  Administered 2014-01-12 (×2): 25 ug via INTRAVENOUS

## 2014-01-12 MED ORDER — MIDAZOLAM HCL 5 MG/ML IJ SOLN
INTRAMUSCULAR | Status: AC
  Filled 2014-01-12: qty 2

## 2014-01-12 NOTE — Progress Notes (Signed)
  Echocardiogram Echocardiogram Transesophageal has been performed.  Sara Donaldson, Sara Donaldson 01/12/2014, 8:56 AM

## 2014-01-12 NOTE — Interval H&P Note (Signed)
History and Physical Interval Note:  01/12/2014 7:27 AM  Sara Donaldson  has presented today for surgery, with the diagnosis of AS  The various methods of treatment have been discussed with the patient and family. After consideration of risks, benefits and other options for treatment, the patient has consented to  Procedure(s): TRANSESOPHAGEAL ECHOCARDIOGRAM (TEE) (N/A) as a surgical intervention .  The patient's history has been reviewed, patient examined, no change in status, stable for surgery.  I have reviewed the patient's chart and labs.  Questions were answered to the patient's satisfaction.     Lizza Huffaker S

## 2014-01-12 NOTE — Discharge Instructions (Addendum)
Conscious Sedation °Sedation is the use of medicines to promote relaxation and relieve discomfort and anxiety. Conscious sedation is a type of sedation. Under conscious sedation you are less alert than normal but are still able to respond to instructions or stimulation. Conscious sedation is used during short medical and dental procedures. It is milder than deep sedation or general anesthesia and allows you to return to your regular activities sooner.  °LET YOUR HEALTH CARE PROVIDER KNOW ABOUT:  °· Any allergies you have. °· All medicines you are taking, including vitamins, herbs, eye drops, creams, and over-the-counter medicines. °· Use of steroids (by mouth or creams). °· Previous problems you or members of your family have had with the use of anesthetics. °· Any blood disorders you have. °· Previous surgeries you have had. °· Medical conditions you have. °· Possibility of pregnancy, if this applies. °· Use of cigarettes, alcohol, or illegal drugs. °RISKS AND COMPLICATIONS °Generally, this is a safe procedure. However, as with any procedure, problems can occur. Possible problems include: °· Oversedation. °· Trouble breathing on your own. You may need to have a breathing tube until you are awake and breathing on your own. °· Allergic reaction to any of the medicines used for the procedure. °BEFORE THE PROCEDURE °· You may have blood tests done. These tests can help show how well your kidneys and liver are working. They can also show how well your blood clots. °· A physical exam will be done.   °· Only take medicines as directed by your health care provider. You may need to stop taking medicines (such as blood thinners, aspirin, or nonsteroidal anti-inflammatory drugs) before the procedure.   °· Do not eat or drink at least 6 hours before the procedure or as directed by your health care provider. °· Arrange for a responsible adult, family member, or friend to take you home after the procedure. He or she should stay  with you for at least 24 hours after the procedure, until the medicine has worn off. °PROCEDURE  °· An intravenous (IV) catheter will be inserted into one of your veins. Medicine will be able to flow directly into your body through this catheter. You may be given medicine through this tube to help prevent pain and help you relax. °· The medical or dental procedure will be done. °AFTER THE PROCEDURE °· You will stay in a recovery area until the medicine has worn off. Your blood pressure and pulse will be checked.   °·  Depending on the procedure you had, you may be allowed to go home when you can tolerate liquids and your pain is under control. °Document Released: 03/28/2001 Document Revised: 07/08/2013 Document Reviewed: 03/10/2013 °Exit[employer]® Patient Information ©2015 [employer]. This information is not intended to replace advice given to you by your health care provider. Make sure you discuss any questions you have with your health care provider. ° °

## 2014-01-12 NOTE — H&P (Signed)
Referring Physician:  Sol Blazingina M Mcnulty is an 80 y.o. female.                       Chief Complaint: Exertional dyspnea HPI: 80 year old female with 2-3 month history of light headedness and exertional dyspnea. Echocardiogram in office showed moderate to severe aortic stenosis.   Past Medical History  Diagnosis Date  . Hypertension       History reviewed. No pertinent past surgical history.  History reviewed. Family history. Mom died of MI age 10861. Dad died of CHF age 78. She has one brother and 2 sisters living and well.  Social History:  reports that she has never smoked. She does not have any smokeless tobacco history on file. She reports that she does not drink alcohol or use illicit drugs. Husband died of cancer of lung. Had 2 sons, one died of alcohol complication age 78 in [id number]. Has 2 daughters, living and well. She is retired Film/video editoroffice worker.  Allergies: No Known Allergies  Medications Prior to Admission  Medication Sig Dispense Refill  . aspirin EC 81 MG tablet Take 81 mg by mouth daily at 12 noon.       . Calcium Carb-Cholecalciferol (CALCIUM 600+D3) 600-800 MG-UNIT TABS Take 1 tablet by mouth 2 (two) times daily.      . Carboxymeth-Glycerin-Polysorb (REFRESH OPTIVE ADVANCED) 0.5-1-0.5 % SOLN Place 1 drop into both eyes 2 (two) times daily.      Marland Kitchen. doxazosin (CARDURA) 2 MG tablet Take 1 mg by mouth daily at 12 noon.       . hydrochlorothiazide (HYDRODIURIL) 25 MG tablet Take 12.5 mg by mouth daily.      Marland Kitchen. lovastatin (MEVACOR) 20 MG tablet Take 20 mg by mouth 2 (two) times daily.      . Multiple Vitamin (MULTIVITAMIN WITH MINERALS) TABS tablet Take 1 tablet by mouth every evening. 4pm (Centrum Silver)      . niacin 500 MG tablet Take 500 mg by mouth 2 (two) times daily.      . traZODone (DESYREL) 50 MG tablet Take 50 mg by mouth at bedtime.      . verapamil (CALAN-SR) 240 MG CR tablet Take 240 mg by mouth 2 (two) times daily.       . vitamin E 400 UNIT capsule Take 400 Units by mouth  every evening. 4pm      . zolpidem (AMBIEN) 5 MG tablet Take 5 mg by mouth at bedtime.        No results found for this or any previous visit (from the past 48 hour(s)). No results found.  Review Of Systems No weight gain or loss, + dyspnea, + anxiety, + arthritis, No GI or GU bleed. No stroke, seizures ior psych admission.  Blood pressure 183/56, pulse 74, temperature 98 F (36.7 C), temperature source Oral, resp. rate 14, SpO2 97.00%. Physical Exam: HEENT: [place]/AT, Blue eyes, Conj-pink, Sclera-white, PERL, EOMI. Bilateral Lens implants. Throat- pink, midline tongue.  Neck: No JVD, bruit or thyromegaly.  Lungs: Clear, bil.  Heart: Normal S1 and S2. IV/VI systolic and II/VI diastolic murmur.  Abdomen; Soft.  Ext: No E/C/C. 2 + pulses.  CNS: Moves all 4 extremities. Bilateral equal grips.  Skin: warm and dry.  Assessment/Plan Moderate to severe AS Exertional dyspnea  TEE for AV evaluation.  KADAKIA,AJAY S 01/12/2014, 7:20 AM

## 2014-01-12 NOTE — CV Procedure (Signed)
INDICATIONS:   The patient is 80 year old female with known AS and AR has exertional dyspnea.Marland Kitchen.  PROCEDURE:  Informed consent was discussed including risks, benefits and alternatives for the procedure.  Risks include, but are not limited to, cough, sore throat, vomiting, nausea, somnolence, esophageal and stomach trauma or perforation, bleeding, low blood pressure, aspiration, pneumonia, infection, trauma to the teeth and death.    Patient was given sedation.  The oropharynx was anesthetized with topical lidocaine.  The transesophageal probe was inserted in the esophagus and stomach and multiple views were obtained.  Agitated saline was used after the transesophageal probe was removed from the body.  The patient was kept under observation until the patient left the procedure room.  The patient left the procedure room in stable condition.   COMPLICATIONS:  There were no immediate complications.  FINDINGS:  1. LEFT VENTRICLE: The left ventricle has moderate hypertrophy and normal function.  Wall motion is normal.  No thrombus or masses seen in the left ventricle.  2. RIGHT VENTRICLE:  The right ventricle is normal in structure and function without any thrombus or masses.    3. LEFT ATRIUM:  The left atrium is mildly dilated without any thrombus or masses.  4. LEFT ATRIAL APPENDAGE:  The left atrial appendage is free of any thrombus or masses.  5. RIGHT ATRIUM:  The right atrium is free of any thrombus or masses.    6. ATRIAL SEPTUM:  The atrial septum is aneurysmal and without any ASD or PFO. Negative bubble study.  7. MITRAL VALVE:  The mitral valve is normal in structure and function with mild regurgitation, no masses, stenosis or vegetations.  8. TRICUSPID VALVE:  The tricuspid valve is normal in structure and function with mild regurgitation, no masses, stenosis or vegetations.  9. AORTIC VALVE:  The aortic valve is calcific, thickened and with limited mobility. It has moderate to severe  stenosis and regurgitation with 33 mm of gradient and calculated and measured valve area of approximately 1.06 cm2. No masses or vegetations.   10. PULMONIC VALVE:  The pulmonic valve is normal in structure and function with mild regurgitation, no masses, stenosis or vegetations. Normal right and left pulmonary veins.  11. AORTIC ARCH, ASCENDING AND DESCENDING AORTA:  The aorta had moderate atherosclerosis all over descending aorta. Dilated ascending aorta and normal calibre descending aorta.  The aortic arch was normal.  IMPRESSION:   1  Moderate to severe AS and AR. 2. Mild MR, TR and PR 3. Ascending aortic dilatation and descending aortic moderate atheroma and calcification.  RECOMMENDATIONS:   Review findings with CVTS for possible AV replacement surgery.

## 2014-01-13 ENCOUNTER — Encounter: Payer: Medicare Other | Admitting: Thoracic Surgery (Cardiothoracic Vascular Surgery)

## 2014-01-13 ENCOUNTER — Encounter (HOSPITAL_COMMUNITY): Payer: Self-pay | Admitting: Cardiovascular Disease

## 2014-02-09 ENCOUNTER — Institutional Professional Consult (permissible substitution) (INDEPENDENT_AMBULATORY_CARE_PROVIDER_SITE_OTHER): Payer: Medicare Other | Admitting: Thoracic Surgery (Cardiothoracic Vascular Surgery)

## 2014-02-09 ENCOUNTER — Other Ambulatory Visit: Payer: Self-pay | Admitting: *Deleted

## 2014-02-09 ENCOUNTER — Encounter: Payer: Self-pay | Admitting: Thoracic Surgery (Cardiothoracic Vascular Surgery)

## 2014-02-09 VITALS — BP 118/64 | HR 90 | Resp 20 | Ht 61.0 in | Wt 140.0 lb

## 2014-02-09 DIAGNOSIS — E785 Hyperlipidemia, unspecified: Secondary | ICD-10-CM | POA: Insufficient documentation

## 2014-02-09 DIAGNOSIS — I359 Nonrheumatic aortic valve disorder, unspecified: Secondary | ICD-10-CM

## 2014-02-09 DIAGNOSIS — I350 Nonrheumatic aortic (valve) stenosis: Secondary | ICD-10-CM

## 2014-02-09 DIAGNOSIS — I10 Essential (primary) hypertension: Secondary | ICD-10-CM | POA: Insufficient documentation

## 2014-02-09 NOTE — Progress Notes (Signed)
PCP is Lonie Peak, PA-C Referring Provider is Ricki Rodriguez, MD  Chief Complaint  Patient presents with  . Aortic Stenosis    Surgical eval for possible AVR, TEE 01/12/14, Cadiac Cath 10/24/13    HPI: 80 year old woman who presents with chief complaints of shortness of breath and lightheadedness with exertion.  Sara Donaldson is an 80 year old woman who has a history of hypertension and hypercholesterolemia, but for the most part has been healthy and without significant physical limitations. Sara Donaldson has had a heart murmur for a long time. Sara Donaldson was doing well until April of this year. Sara Donaldson was working in her garden on April 1 when Sara Donaldson says that Sara Donaldson felt like Sara Donaldson was going to blackout. Sara Donaldson felt very lightheaded and dizzy and was having trouble catching her breath. Sara Donaldson went to the emergency room. A head CT showed no pathology to explain her symptoms. Sara Donaldson did have chronic microvascular disease. Sara Donaldson had a cardiac catheterization which showed normal coronary arteries. Her aortic valve gradient was 31 mm of mercury. Her valve area calculated at 0.95 cm. Decision was made to manage her medically with plans for followup echocardiography in one year.  In the interim since that time Sara Donaldson continued to have very predictable presyncopal episodes and shortness of breath with exertion. Sara Donaldson says that Sara Donaldson gets every time Sara Donaldson walks to her mailbox which is about 300 feet from her house. Sara Donaldson has not had any of these symptoms at rest. Sara Donaldson recently had a transesophageal echocardiogram which showed a gradient of 33 mm of mercury. Her valve area was 1.04 by planimetry. He also has moderate to severe aortic insufficiency.   Past Medical History  Diagnosis Date  . Hypertension     Past Surgical History  Procedure Laterality Date  . Tee without cardioversion N/A 01/12/2014    Procedure: TRANSESOPHAGEAL ECHOCARDIOGRAM (TEE);  Surgeon: Ricki Rodriguez, MD;  Location: [facility] ENDOSCOPY;  Service: Cardiovascular;  Laterality: N/A;  .  Eye surgery      Family history  Extensive family history of heart disease with both her mother and father dying of heart disease a relatively young ages. Also history of heart disease in an aunt and on call and grandfather. Grandmother died of cancer   Social History History  Substance Use Topics  . Smoking status: Never Smoker   . Smokeless tobacco: Not on file  . Alcohol Use: No    Current Outpatient Prescriptions  Medication Sig Dispense Refill  . aspirin EC 81 MG tablet Take 81 mg by mouth daily at 12 noon.       . Calcium Carb-Cholecalciferol (CALCIUM 600+D3) 600-800 MG-UNIT TABS Take 1 tablet by mouth 2 (two) times daily.      . Carboxymeth-Glycerin-Polysorb (REFRESH OPTIVE ADVANCED) 0.5-1-0.5 % SOLN Place 1 drop into both eyes 2 (two) times daily.      Marland Kitchen doxazosin (CARDURA) 2 MG tablet Take 1 mg by mouth daily at 12 noon.       . hydrochlorothiazide (HYDRODIURIL) 25 MG tablet Take 12.5 mg by mouth daily.      Marland Kitchen lovastatin (MEVACOR) 20 MG tablet Take 20 mg by mouth 2 (two) times daily.      . Multiple Vitamin (MULTIVITAMIN WITH MINERALS) TABS tablet Take 1 tablet by mouth every evening. 4pm (Centrum Silver)      . niacin 500 MG tablet Take 500 mg by mouth 2 (two) times daily.      . verapamil (CALAN-SR) 240 MG CR tablet Take 240 mg by mouth 2 (  two) times daily.       . vitamin E 400 UNIT capsule Take 400 Units by mouth every evening. 4pm      . zolpidem (AMBIEN) 5 MG tablet Take 5 mg by mouth at bedtime.       No current facility-administered medications for this visit.    Allergies  Allergen Reactions  . Trazodone And Nefazodone Hives    Review of Systems  Constitutional: Positive for fatigue.  HENT: Negative for dental problem.   Respiratory: Positive for cough and shortness of breath (with exertion).        Has been told Sara Donaldson has COPD, denies wheezing or use of inhalers  Cardiovascular: Negative for chest pain.  Neurological: Positive for dizziness and  light-headedness (Exertion). Negative for syncope.  Hematological: Negative for adenopathy. Does not bruise/bleed easily.  Psychiatric/Behavioral: The patient is nervous/anxious.   All other systems reviewed and are negative.   BP 118/64  Pulse 90  Resp 20  Ht 5\' 1"  (1.549 m)  Wt 140 lb (63.504 kg)  BMI 26.47 kg/m2  SpO2 96% Physical Exam  Vitals reviewed. Constitutional: Sara Donaldson is oriented to person, place, and time. Sara Donaldson appears well-developed and well-nourished. No distress.  HENT:  Head: Normocephalic and atraumatic.  Eyes: EOM are normal.  Neck: Neck supple. No thyromegaly present.  Positive transmitted murmur bilaterally  Cardiovascular: Normal rate, regular rhythm and intact distal pulses.   Murmur (3/6 crescendo decrescendo harsh systolic murmur) heard. Pulmonary/Chest: Effort normal and breath sounds normal. Sara Donaldson has no wheezes. Sara Donaldson has no rales.  Abdominal: Soft. There is no tenderness.  Musculoskeletal: Sara Donaldson exhibits no edema.  Lymphadenopathy:    Sara Donaldson has no cervical adenopathy.  Neurological: Sara Donaldson is alert and oriented to person, place, and time. No cranial nerve deficit.  Skin: Skin is warm and dry.     Diagnostic Tests: TE ECHOCARDIOGRAM 01/12/2014  Study Conclusions  - Left ventricle: There was moderate concentric hypertrophy. Systolic function was normal. Wall motion was normal; there were no regional wall motion abnormalities. - Aortic valve: Cusp separation was severely reduced. Valve mobility was restricted. There was moderate to severe regurgitation. Valve area (VTI): 1.1 cm^2. Valve area (Vmax): 1.08 cm^2. - Mitral valve: There was mild regurgitation. - Left atrium: The atrium was mildly dilated. No evidence of thrombus in the atrial cavity or appendage. - Right atrium: No evidence of thrombus in the atrial cavity or appendage. - Atrial septum: The septum bowed from right to left, consistent with increased right atrial pressure. No defect or patent  foramen ovale was identified. - Superior vena cava: The study excluded a thrombus.  CARDIAC CATHETERIZATION 10/24/2013 HEMODYNAMICS: Pulmonary pressure was 26/10: Wedge pressure was : RV pressure was 20/0: RA pressure was 6/2.  Oxygen saturation of blood sample from PA was 59 % and AO was 90 %.  Aortic pressure was 147/58; LV pressure was 174/3; LVEDP 8. There was 31 mm gradient between the left ventricle and aorta. AV area was 0.95 cm2.  Cardiac output was 3.26 L/min by Fick method and 4.13 L/Min by Thermodilution.  ANGIOGRAM/CORONARY ARTERIOGRAM: The left main coronary artery is unremarkable.  The left anterior descending artery is unremarkable. Diagonal is unremarkable.  The left circumflex artery has mild (20-30 %) proximal disease. OM is unremarkable.  The right coronary artery is dominant and has mild calcification, It has mild (20 %) proximal disease  LEFT VENTRICULOGRAM: Left ventricular angiogram was done in the 30 RAO projection and revealed normal left ventricular wall motion and  systolic function with an estimated ejection fraction of 70 %. LVEDP was 8 mmHg. Significant calcific AV.  IMPRESSION OF HEART CATHETERIZATION:  1. Normal left main coronary artery. 2. Normal left anterior descending artery and its branches. 3. Mild disease of left circumflex artery and its branches. 4. Mild disease right coronary artery. 5. Normal left ventricular systolic function. LVEDP 8 mmHg. Ej. fraction 70 %. 6. Normal PA and wedge pressures. 7. Moderate AS.  RECOMMENDATION:  Medical treatment.  If symptoms persist then TEE for AV evaluation.  Impression: 80 year old woman who is in generally good health who has moderately severe aortic stenosis and moderately severe aortic insufficiency. Her valve area calculated at 0.95 cm a catheterization. However Sara Donaldson is symptomatic. With questioning about her symptoms, they are very consistent with generally the same amount of exertion each time bringing  the symptoms on. They're relieved by rest. Sara Donaldson's not had any lightheadedness or dizziness at rest or with minimal exertion. Her symptomatology is completely consistent with the etiology being cardiac in origin.  I discussed with her the indications, risks, benefits, and alternatives. Sara Donaldson does understand that her valve area does not fall into the range where that alone is an indication for surgery. However, I think that with classic symptomatology there is a sufficient indication to replace the aortic valve. Sara Donaldson does understand that continued followup and limitation of activities to avoid symptoms is a reasonable alternative.   We discussed using a tissue valve in preference to a mechanical valve given her age. Sara Donaldson is in agreement with that and does not want to be on lifelong anticoagulation.  I discussed with her the general nature of the procedure including the need for general anesthesia, the incision to be used, the use of cardiopulmonary bypass, expected hospital stay, and overall recovery. Sara Donaldson understands the risks include, but are not limited to, death, MI, DVT, PE, bleeding, possible transfusion, stroke, other neurologic dysfunction, infection, heart block, cardiac arrhythmias, other organ system dysfunction including respiratory, renal, or GI complications. Sara Donaldson understands and accepts the risks and does wish to proceed.  Sara Donaldson has not seen a dentist in some time. Sara Donaldson does not have any teeth that are loose or painful. I recommended Sara Donaldson see her dentist for a cleaning and general checkup prior to the aortic valve replacement.  Plan: Aortic valve replacement with a tissue valve on Monday, August 17. Sara Donaldson will be admitted on the day of surgery.

## 2014-02-16 ENCOUNTER — Encounter (HOSPITAL_COMMUNITY): Payer: Self-pay | Admitting: Pharmacy Technician

## 2014-02-26 ENCOUNTER — Ambulatory Visit (HOSPITAL_COMMUNITY)
Admission: RE | Admit: 2014-02-26 | Discharge: 2014-02-26 | Disposition: A | Discharge status: Home / Self Care | Payer: Medicare Other | Source: Ambulatory Visit | Attending: Thoracic Surgery (Cardiothoracic Vascular Surgery) | Admitting: Thoracic Surgery (Cardiothoracic Vascular Surgery)

## 2014-02-26 ENCOUNTER — Encounter (HOSPITAL_COMMUNITY)
Admission: RE | Admit: 2014-02-26 | Discharge: 2014-02-26 | Disposition: A | Discharge status: Home / Self Care | Payer: Medicare Other | Source: Ambulatory Visit | Attending: Thoracic Surgery (Cardiothoracic Vascular Surgery) | Admitting: Thoracic Surgery (Cardiothoracic Vascular Surgery)

## 2014-02-26 ENCOUNTER — Encounter (HOSPITAL_COMMUNITY): Payer: Self-pay

## 2014-02-26 VITALS — BP 120/60 | HR 78 | Temp 98.1°F | Resp 20 | Ht 61.0 in | Wt 145.8 lb

## 2014-02-26 DIAGNOSIS — I359 Nonrheumatic aortic valve disorder, unspecified: Secondary | ICD-10-CM | POA: Diagnosis present

## 2014-02-26 DIAGNOSIS — Z01818 Encounter for other preprocedural examination: Secondary | ICD-10-CM | POA: Insufficient documentation

## 2014-02-26 DIAGNOSIS — Z01812 Encounter for preprocedural laboratory examination: Secondary | ICD-10-CM | POA: Diagnosis not present

## 2014-02-26 DIAGNOSIS — Z01810 Encounter for preprocedural cardiovascular examination: Secondary | ICD-10-CM

## 2014-02-26 HISTORY — DX: Bronchitis, not specified as acute or chronic: J40

## 2014-02-26 HISTORY — DX: Pure hypercholesterolemia, unspecified: E78.00

## 2014-02-26 HISTORY — DX: Nonrheumatic aortic (valve) stenosis: I35.0

## 2014-02-26 HISTORY — DX: Cardiac murmur, unspecified: R01.1

## 2014-02-26 LAB — URINALYSIS, ROUTINE W REFLEX MICROSCOPIC
BILIRUBIN URINE: NEGATIVE
Glucose, UA: NEGATIVE mg/dL
Hgb urine dipstick: NEGATIVE
KETONES UR: NEGATIVE mg/dL
Leukocytes, UA: NEGATIVE
NITRITE: NEGATIVE
Protein, ur: NEGATIVE mg/dL
Specific Gravity, Urine: 1.016 (ref 1.005–1.030)
Urobilinogen, UA: 0.2 mg/dL (ref 0.0–1.0)
pH: 7.5 (ref 5.0–8.0)

## 2014-02-26 LAB — COMPREHENSIVE METABOLIC PANEL
ALT: 18 U/L (ref 0–35)
ANION GAP: 15 (ref 5–15)
AST: 23 U/L (ref 0–37)
Albumin: 3.9 g/dL (ref 3.5–5.2)
Alkaline Phosphatase: 59 U/L (ref 39–117)
BUN: 16 mg/dL (ref 6–23)
CALCIUM: 9.3 mg/dL (ref 8.4–10.5)
CO2: 23 meq/L (ref 19–32)
Chloride: 99 mEq/L (ref 96–112)
Creatinine, Ser: 0.58 mg/dL (ref 0.50–1.10)
GFR, EST NON AFRICAN AMERICAN: 84 mL/min — AB (ref >90)
GLUCOSE: 94 mg/dL (ref 70–99)
Potassium: 4 mEq/L (ref 3.7–5.3)
Sodium: 137 mEq/L (ref 137–147)
Total Bilirubin: 0.3 mg/dL (ref 0.3–1.2)
Total Protein: 6.9 g/dL (ref 6.0–8.3)

## 2014-02-26 LAB — BLOOD GAS, ARTERIAL
Acid-Base Excess: 2.7 mmol/L — ABNORMAL HIGH (ref 0.0–2.0)
BICARBONATE: 26.8 meq/L — AB (ref 20.0–24.0)
DRAWN BY: 421801
O2 SAT: 96.7 %
PATIENT TEMPERATURE: 98.6
TCO2: 28.1 mmol/L (ref 0–100)
pCO2 arterial: 41.9 mmHg (ref 35.0–45.0)
pH, Arterial: 7.422 (ref 7.350–7.450)
pO2, Arterial: 83.7 mmHg (ref 80.0–100.0)

## 2014-02-26 LAB — APTT: aPTT: 26 seconds (ref 24–37)

## 2014-02-26 LAB — CBC
HCT: 43.3 % (ref 36.0–46.0)
Hemoglobin: 14.4 g/dL (ref 12.0–15.0)
MCH: 29.8 pg (ref 26.0–34.0)
MCHC: 33.3 g/dL (ref 30.0–36.0)
MCV: 89.5 fL (ref 78.0–100.0)
Platelets: 188 10*3/uL (ref 150–400)
RBC: 4.84 MIL/uL (ref 3.87–5.11)
RDW: 12.8 % (ref 11.5–15.5)
WBC: 7.1 10*3/uL (ref 4.0–10.5)

## 2014-02-26 LAB — SURGICAL PCR SCREEN
MRSA, PCR: NEGATIVE
STAPHYLOCOCCUS AUREUS: NEGATIVE

## 2014-02-26 LAB — HEMOGLOBIN A1C
HEMOGLOBIN A1C: 5.3 % (ref <5.7)
MEAN PLASMA GLUCOSE: 105 mg/dL (ref <117)

## 2014-02-26 LAB — PROTIME-INR
INR: 0.97 (ref 0.00–1.49)
Prothrombin Time: 12.9 seconds (ref 11.6–15.2)

## 2014-02-26 LAB — ABO/RH: ABO/RH(D): O POS

## 2014-02-26 NOTE — Progress Notes (Signed)
Pt denies SOB and chest pain. Pt currently under the care of Dr.. Orpah CobbAjay Kadakia ( cardiology ) and Dr. Lonie PeakNathan Conroy,  [facility]. Pt chart forwarded to Sl[place]ison, [place] ( anesthesia) to review EKG.

## 2014-02-26 NOTE — Progress Notes (Signed)
VASCULAR LAB PRELIMINARY  PRELIMINARY  PRELIMINARY  PRELIMINARY  Pre-op Cardiac Surgery  Carotid Findings:  Bilateral:  1-39% ICA stenosis.  Vertebral artery flow is antegrade.      Upper Extremity Right Left  Brachial Pressures 185 Triphasic  184 Triphasic   Radial Waveforms Triphasic  Triphasic   Ulnar Waveforms Triphasic  Triphasic   Palmar Arch (Allen's Test) Within normal limits  Within normal limits      Jumar Greenstreet, RVT 02/26/2014, 2:57 PM

## 2014-02-26 NOTE — Pre-Procedure Instructions (Signed)
Sara Donaldson  02/26/2014   Your procedure is scheduled on: Monday, March 02, 2014  [facility] at 5:30 AM.  Call this number if you have problems the morning of surgery: [phone]   Remember: Bring Incentive Spirometer and teaching packet on day of admission   Do not eat food or drink liquids after midnight Sunday, March 01, 2014   Take these medicines the morning of surgery with A SIP OF WATER:  verapamil (CALAN-SR), REFRESH OPTIVE ADVANCED) eye drops  Stop taking Multivitamins, Vitamin E, and herbal medications. Do not take any NSAIDs ie: Ibuprofen, Advil, Naproxen or any medication containing Aspirin.  Do not wear jewelry, make-up or nail polish.  Do not wear lotions, powders, or perfumes. You may NOT wear deodorant.  Do not shave 48 hours prior to surgery.   Do not bring valuables to the hospital.  [facility] is not responsible for any belongings or valuables.               Contacts, dentures or bridgework may not be worn into surgery.  Leave suitcase in the car. After surgery it may be brought to your room.  For patients admitted to the hospital, discharge time is determined by your treatment team.               Patients discharged the day of surgery will not be allowed to drive home.  Name and phone number of your driver:   Special Instructions:  Special Instructions:Special Instructions: [facility] - Pre[facility]  Before surgery, you can play an important role.  Because skin is not sterile, your skin needs to be as free of germs as possible.  You can reduce the number of germs on you skin by washing with CHG (chlorahexidine gluconate) soap before surgery.  CHG is an antiseptic cleaner which kills germs and bonds with the skin to continue killing germs even after washing.  Please DO NOT use if you have an allergy to CHG or antibacterial soaps.  If your skin becomes reddened/irritated stop using the CHG and inform your nurse when you  arrive at Short Stay.  Do not shave (including legs and underarms) for at least 48 hours prior to the first CHG shower.  You may shave your face.  Please follow these instructions carefully:   1.  Shower with CHG Soap the night before surgery and the morning of Surgery.  2.  If you choose to wash your hair, wash your hair first as usual with your normal shampoo.  3.  After you shampoo, rinse your hair and body thoroughly to remove the Shampoo.  4.  Use CHG as you would any other liquid soap.  You can apply chg directly  to the skin and wash gently with scrungie or a clean washcloth.  5.  Apply the CHG Soap to your body ONLY FROM THE NECK DOWN.  Do not use on open wounds or open sores.  Avoid contact with your eyes, ears, mouth and genitals (private parts).  Wash genitals (private parts) with your normal soap.  6.  Wash thoroughly, paying special attention to the area where your surgery will be performed.  7.  Thoroughly rinse your body with warm water from the neck down.  8.  DO NOT shower/wash with your normal soap after using and rinsing off the CHG Soap.  9.  Pat yourself dry with a clean towel.  10.  Wear clean pajamas.            11.  Place clean sheets on your bed the night of your first shower and do not sleep with pets.  Day of Surgery  Do not apply any lotions/deodorants the morning of surgery.  Please wear clean clothes to the hospital/surgery center.   Please read over the following fact sheets that you were given: Pain Booklet, Coughing and Deep Breathing, Blood Transfusion Information, Open Heart Packet, MRSA Information and Surgical Site Infection Prevention

## 2014-02-27 NOTE — Progress Notes (Signed)
Anesthesia chart review: Patient is a 80 year old female scheduled for AVR on 03/02/14 by Dr. Dorris FetchHendrickson.  History includes symptomatic moderate-severe AS, HTN, hypercholesterolemia, non-smoker. PCP is Sara PeakNathan Conroy, PA-C. Cardiologist is Dr. Algie CofferKadakia.    TEE on 01/12/14 showed: - Left ventricle: There was moderate concentric hypertrophy. Systolic function was normal. Wall motion was normal; there were no regional wall motion abnormalities. - Aortic valve: Cusp separation was severely reduced. Valve mobility was restricted. There was moderate to severe regurgitation. Valve area (VTI): 1.1 cm^2. Valve area (Vmax): 1.08 cm^2. - Mitral valve: There was mild regurgitation. - Left atrium: The atrium was mildly dilated. No evidence of thrombus in the atrial cavity or appendage. - Right atrium: No evidence of thrombus in the atrial cavity or appendage. - Atrial septum: The septum bowed from right to left, consistent with increased right atrial pressure. No defect or patent foramen ovale was identified. - Superior vena cava: The study excluded a thrombus.  Cardiac cath on 10/24/13 showed: IMPRESSION OF HEART CATHETERIZATION:  1. Normal left main coronary artery. 2. Normal left anterior descending artery and its branches. 3. Mild disease of left circumflex artery and its branches. 4. Mild disease right coronary artery. 5. Normal left ventricular systolic function. LVEDP 8 mmHg. Ej. fraction 70 %. 6. Normal PA and wedge pressures. 7. Moderate AS.   Carotid duplex on 02/26/14 showed: 1-39% bilateral ICA stenosis, antegrade vertebral flow.  Preoperative EKG, CXR, PFTs, and labs noted.   If no acute changes then I anticipate that she can proceed.  Sara Ochsllison Hammond Obeirne, PA-C [facility]Anesthesiology Phone [phone] 02/27/2014 9:39 AM

## 2014-03-01 MED ORDER — DEXTROSE 5 % IV SOLN
750.0000 mg | INTRAVENOUS | Status: DC
  Filled 2014-03-01: qty 750

## 2014-03-01 MED ORDER — EPINEPHRINE HCL 1 MG/ML IJ SOLN
0.5000 ug/min | INTRAMUSCULAR | Status: DC
  Filled 2014-03-01: qty 4

## 2014-03-01 MED ORDER — DEXTROSE 5 % IV SOLN
1.5000 g | INTRAVENOUS | Status: AC
  Administered 2014-03-02: .75 g via INTRAVENOUS
  Administered 2014-03-02: 1.5 g via INTRAVENOUS
  Filled 2014-03-01 (×2): qty 1.5

## 2014-03-01 MED ORDER — DEXMEDETOMIDINE HCL IN NACL 400 MCG/100ML IV SOLN
0.1000 ug/kg/h | INTRAVENOUS | Status: AC
  Administered 2014-03-02: 0.3 ug/kg/h via INTRAVENOUS
  Filled 2014-03-01: qty 100

## 2014-03-01 MED ORDER — SODIUM CHLORIDE 0.9 % IV SOLN
INTRAVENOUS | Status: AC
  Administered 2014-03-02: 69.8 mL/h via INTRAVENOUS
  Filled 2014-03-01: qty 40

## 2014-03-01 MED ORDER — PLASMA-LYTE 148 IV SOLN
INTRAVENOUS | Status: AC
  Administered 2014-03-02: 09:00:00
  Filled 2014-03-01: qty 2.5

## 2014-03-01 MED ORDER — SODIUM CHLORIDE 0.9 % IV SOLN
INTRAVENOUS | Status: DC
  Filled 2014-03-01: qty 30

## 2014-03-01 MED ORDER — VANCOMYCIN HCL 10 G IV SOLR
1250.0000 mg | INTRAVENOUS | Status: AC
  Administered 2014-03-02: 1250 mg via INTRAVENOUS
  Filled 2014-03-01: qty 1250

## 2014-03-01 MED ORDER — METOPROLOL TARTRATE 12.5 MG HALF TABLET: 12.5000 mg | ORAL_TABLET | Freq: Once | ORAL | Status: DC

## 2014-03-01 MED ORDER — PHENYLEPHRINE HCL 10 MG/ML IJ SOLN
30.0000 ug/min | INTRAVENOUS | Status: AC
  Administered 2014-03-02: 50 ug/min via INTRAVENOUS
  Filled 2014-03-01: qty 2

## 2014-03-01 MED ORDER — NITROGLYCERIN IN D5W 200-5 MCG/ML-% IV SOLN
2.0000 ug/min | INTRAVENOUS | Status: DC
  Filled 2014-03-01: qty 250

## 2014-03-01 MED ORDER — DOPAMINE-DEXTROSE 3.2-5 MG/ML-% IV SOLN
2.0000 ug/kg/min | INTRAVENOUS | Status: DC
  Filled 2014-03-01: qty 250

## 2014-03-01 MED ORDER — MAGNESIUM SULFATE 50 % IJ SOLN
40.0000 meq | INTRAMUSCULAR | Status: DC
  Filled 2014-03-01: qty 10

## 2014-03-01 MED ORDER — POTASSIUM CHLORIDE 2 MEQ/ML IV SOLN
80.0000 meq | INTRAVENOUS | Status: DC
  Filled 2014-03-01: qty 40

## 2014-03-01 MED ORDER — SODIUM CHLORIDE 0.9 % IV SOLN
INTRAVENOUS | Status: AC
  Administered 2014-03-02: 1.1 [IU]/h via INTRAVENOUS
  Filled 2014-03-01: qty 1

## 2014-03-02 ENCOUNTER — Encounter (HOSPITAL_COMMUNITY): Payer: Medicare Other | Admitting: Vascular Surgery

## 2014-03-02 ENCOUNTER — Inpatient Hospital Stay (HOSPITAL_COMMUNITY): Payer: Medicare Other | Admitting: Anesthesiology

## 2014-03-02 ENCOUNTER — Encounter (HOSPITAL_COMMUNITY): Payer: Self-pay | Admitting: *Deleted

## 2014-03-02 ENCOUNTER — Encounter (HOSPITAL_COMMUNITY)
Admission: RE | Disposition: A | Discharge status: Home Health | Payer: Medicare Other | Source: Ambulatory Visit | Attending: Thoracic Surgery (Cardiothoracic Vascular Surgery)

## 2014-03-02 ENCOUNTER — Inpatient Hospital Stay (HOSPITAL_COMMUNITY)
Admission: RE | Admit: 2014-03-02 | Discharge: 2014-03-10 | DRG: 220 | Disposition: A | Discharge status: Home Health | Payer: Medicare Other | Source: Ambulatory Visit | Attending: Thoracic Surgery (Cardiothoracic Vascular Surgery) | Admitting: Thoracic Surgery (Cardiothoracic Vascular Surgery)

## 2014-03-02 ENCOUNTER — Inpatient Hospital Stay (HOSPITAL_COMMUNITY): Payer: Medicare Other

## 2014-03-02 DIAGNOSIS — D6959 Other secondary thrombocytopenia: Secondary | ICD-10-CM | POA: Diagnosis not present

## 2014-03-02 DIAGNOSIS — Z888 Allergy status to other drugs, medicaments and biological substances status: Secondary | ICD-10-CM | POA: Diagnosis not present

## 2014-03-02 DIAGNOSIS — E8779 Other fluid overload: Secondary | ICD-10-CM | POA: Diagnosis not present

## 2014-03-02 DIAGNOSIS — D62 Acute posthemorrhagic anemia: Secondary | ICD-10-CM | POA: Diagnosis not present

## 2014-03-02 DIAGNOSIS — I959 Hypotension, unspecified: Secondary | ICD-10-CM | POA: Diagnosis not present

## 2014-03-02 DIAGNOSIS — E7800 Pure hypercholesterolemia, unspecified: Secondary | ICD-10-CM | POA: Diagnosis present

## 2014-03-02 DIAGNOSIS — I359 Nonrheumatic aortic valve disorder, unspecified: Secondary | ICD-10-CM | POA: Diagnosis present

## 2014-03-02 DIAGNOSIS — I10 Essential (primary) hypertension: Secondary | ICD-10-CM | POA: Diagnosis present

## 2014-03-02 DIAGNOSIS — Z79899 Other long term (current) drug therapy: Secondary | ICD-10-CM | POA: Diagnosis not present

## 2014-03-02 DIAGNOSIS — I350 Nonrheumatic aortic (valve) stenosis: Secondary | ICD-10-CM

## 2014-03-02 DIAGNOSIS — Z8249 Family history of ischemic heart disease and other diseases of the circulatory system: Secondary | ICD-10-CM

## 2014-03-02 DIAGNOSIS — R011 Cardiac murmur, unspecified: Secondary | ICD-10-CM | POA: Diagnosis present

## 2014-03-02 DIAGNOSIS — E876 Hypokalemia: Secondary | ICD-10-CM | POA: Diagnosis not present

## 2014-03-02 DIAGNOSIS — Z7982 Long term (current) use of aspirin: Secondary | ICD-10-CM

## 2014-03-02 DIAGNOSIS — I498 Other specified cardiac arrhythmias: Secondary | ICD-10-CM | POA: Diagnosis not present

## 2014-03-02 DIAGNOSIS — D696 Thrombocytopenia, unspecified: Secondary | ICD-10-CM | POA: Diagnosis not present

## 2014-03-02 DIAGNOSIS — Z952 Presence of prosthetic heart valve: Secondary | ICD-10-CM

## 2014-03-02 HISTORY — DX: Thrombocytopenia, unspecified: D69.6

## 2014-03-02 HISTORY — PX: AORTIC VALVE REPLACEMENT: SHX41

## 2014-03-02 LAB — HEMOGLOBIN AND HEMATOCRIT, BLOOD
HCT: 25.6 % — ABNORMAL LOW (ref 36.0–46.0)
HEMOGLOBIN: 8.7 g/dL — AB (ref 12.0–15.0)

## 2014-03-02 LAB — CBC
HCT: 29.5 % — ABNORMAL LOW (ref 36.0–46.0)
HEMATOCRIT: 26.9 % — AB (ref 36.0–46.0)
Hemoglobin: 10 g/dL — ABNORMAL LOW (ref 12.0–15.0)
Hemoglobin: 9.3 g/dL — ABNORMAL LOW (ref 12.0–15.0)
MCH: 30.5 pg (ref 26.0–34.0)
MCH: 30.9 pg (ref 26.0–34.0)
MCHC: 33.9 g/dL (ref 30.0–36.0)
MCHC: 34.6 g/dL (ref 30.0–36.0)
MCV: 89.9 fL (ref 78.0–100.0)
MCV: 89.4 fL (ref 78.0–100.0)
PLATELETS: 104 10*3/uL — AB (ref 150–400)
Platelets: 90 10*3/uL — ABNORMAL LOW (ref 150–400)
RBC: 3.28 MIL/uL — AB (ref 3.87–5.11)
RBC: 3.01 MIL/uL — AB (ref 3.87–5.11)
RDW: 12.9 % (ref 11.5–15.5)
RDW: 13 % (ref 11.5–15.5)
WBC: 10 10*3/uL (ref 4.0–10.5)
WBC: 12.2 10*3/uL — AB (ref 4.0–10.5)

## 2014-03-02 LAB — POCT I-STAT 3, ART BLOOD GAS (G3+)
Acid-Base Excess: 3 mmol/L — ABNORMAL HIGH (ref 0.0–2.0)
Acid-Base Excess: 2 mmol/L (ref 0.0–2.0)
Acid-base deficit: 1 mmol/L (ref 0.0–2.0)
Acid-base deficit: 2 mmol/L (ref 0.0–2.0)
Acid-base deficit: 2 mmol/L (ref 0.0–2.0)
Bicarbonate: 28.4 mEq/L — ABNORMAL HIGH (ref 20.0–24.0)
Bicarbonate: 25.6 mEq/L — ABNORMAL HIGH (ref 20.0–24.0)
Bicarbonate: 24.6 meq/L — ABNORMAL HIGH (ref 20.0–24.0)
Bicarbonate: 24.6 mEq/L — ABNORMAL HIGH (ref 20.0–24.0)
Bicarbonate: 24.7 mEq/L — ABNORMAL HIGH (ref 20.0–24.0)
O2 Saturation: 100 %
O2 Saturation: 100 %
O2 Saturation: 100 %
O2 Saturation: 98 %
O2 Saturation: 97 %
PCO2 ART: 34.9 mmHg — AB (ref 35.0–45.0)
PCO2 ART: 49 mmHg — AB (ref 35.0–45.0)
PCO2 ART: 50 mmHg — AB (ref 35.0–45.0)
PH ART: 7.401 (ref 7.350–7.450)
PH ART: 7.473 — AB (ref 7.350–7.450)
PH ART: 7.3 — AB (ref 7.350–7.450)
PO2 ART: 355 mmHg — AB (ref 80.0–100.0)
PO2 ART: 96 mmHg (ref 80.0–100.0)
Patient temperature: 36.7
Patient temperature: 36.8
TCO2: 30 mmol/L (ref 0–100)
TCO2: 27 mmol/L (ref 0–100)
TCO2: 26 mmol/L (ref 0–100)
TCO2: 26 mmol/L (ref 0–100)
TCO2: 26 mmol/L (ref 0–100)
pCO2 arterial: 45.8 mmHg — ABNORMAL HIGH (ref 35.0–45.0)
pCO2 arterial: 45.3 mmHg — ABNORMAL HIGH (ref 35.0–45.0)
pH, Arterial: 7.343 — ABNORMAL LOW (ref 7.350–7.450)
pH, Arterial: 7.307 — ABNORMAL LOW (ref 7.350–7.450)
pO2, Arterial: 500 mmHg — ABNORMAL HIGH (ref 80.0–100.0)
pO2, Arterial: 331 mmHg — ABNORMAL HIGH (ref 80.0–100.0)
pO2, Arterial: 115 mmHg — ABNORMAL HIGH (ref 80.0–100.0)

## 2014-03-02 LAB — CREATININE, SERUM
Creatinine, Ser: 0.51 mg/dL (ref 0.50–1.10)
GFR, EST NON AFRICAN AMERICAN: 87 mL/min — AB (ref >90)

## 2014-03-02 LAB — APTT: aPTT: 53 s — ABNORMAL HIGH (ref 24–37)

## 2014-03-02 LAB — POCT I-STAT, CHEM 8
BUN: 10 mg/dL (ref 6–23)
BUN: 7 mg/dL (ref 6–23)
BUN: 9 mg/dL (ref 6–23)
BUN: 7 mg/dL (ref 6–23)
CALCIUM ION: 0.87 mmol/L — AB (ref 1.13–1.30)
CHLORIDE: 104 meq/L (ref 96–112)
CREATININE: 0.4 mg/dL — AB (ref 0.50–1.10)
CREATININE: 0.5 mg/dL (ref 0.50–1.10)
Calcium, Ion: 1.24 mmol/L (ref 1.13–1.30)
Calcium, Ion: 1.17 mmol/L (ref 1.13–1.30)
Calcium, Ion: 1.31 mmol/L — ABNORMAL HIGH (ref 1.13–1.30)
Chloride: 102 mEq/L (ref 96–112)
Chloride: 98 mEq/L (ref 96–112)
Chloride: 109 mEq/L (ref 96–112)
Creatinine, Ser: 0.3 mg/dL — ABNORMAL LOW (ref 0.50–1.10)
Creatinine, Ser: 0.3 mg/dL — ABNORMAL LOW (ref 0.50–1.10)
Glucose, Bld: 96 mg/dL (ref 70–99)
Glucose, Bld: 85 mg/dL (ref 70–99)
Glucose, Bld: 97 mg/dL (ref 70–99)
Glucose, Bld: 136 mg/dL — ABNORMAL HIGH (ref 70–99)
HCT: 40 % (ref 36.0–46.0)
HCT: 24 % — ABNORMAL LOW (ref 36.0–46.0)
HEMATOCRIT: 24 % — AB (ref 36.0–46.0)
HEMATOCRIT: 33 % — AB (ref 36.0–46.0)
HEMOGLOBIN: 13.6 g/dL (ref 12.0–15.0)
HEMOGLOBIN: 11.2 g/dL — AB (ref 12.0–15.0)
Hemoglobin: 8.2 g/dL — ABNORMAL LOW (ref 12.0–15.0)
Hemoglobin: 8.2 g/dL — ABNORMAL LOW (ref 12.0–15.0)
POTASSIUM: 3.8 meq/L (ref 3.7–5.3)
POTASSIUM: 3.8 meq/L (ref 3.7–5.3)
Potassium: 3.8 mEq/L (ref 3.7–5.3)
Potassium: 4.6 mEq/L (ref 3.7–5.3)
SODIUM: 137 meq/L (ref 137–147)
SODIUM: 138 meq/L (ref 137–147)
Sodium: 139 mEq/L (ref 137–147)
Sodium: 139 mEq/L (ref 137–147)
TCO2: 27 mmol/L (ref 0–100)
TCO2: 23 mmol/L (ref 0–100)
TCO2: 26 mmol/L (ref 0–100)
TCO2: 22 mmol/L (ref 0–100)

## 2014-03-02 LAB — POCT I-STAT 3, VENOUS BLOOD GAS (G3P V)
Bicarbonate: 24.3 mEq/L — ABNORMAL HIGH (ref 20.0–24.0)
O2 SAT: 83 %
PO2 VEN: 46 mmHg — AB (ref 30.0–45.0)
TCO2: 25 mmol/L (ref 0–100)
pCO2, Ven: 36.4 mmHg — ABNORMAL LOW (ref 45.0–50.0)
pH, Ven: 7.432 — ABNORMAL HIGH (ref 7.250–7.300)

## 2014-03-02 LAB — POCT I-STAT 4, (NA,K, GLUC, HGB,HCT)
Glucose, Bld: 101 mg/dL — ABNORMAL HIGH (ref 70–99)
HCT: 27 % — ABNORMAL LOW (ref 36.0–46.0)
Hemoglobin: 9.2 g/dL — ABNORMAL LOW (ref 12.0–15.0)
Potassium: 3.3 meq/L — ABNORMAL LOW (ref 3.7–5.3)
Sodium: 139 meq/L (ref 137–147)

## 2014-03-02 LAB — GLUCOSE, CAPILLARY
GLUCOSE-CAPILLARY: 112 mg/dL — AB (ref 70–99)
GLUCOSE-CAPILLARY: 117 mg/dL — AB (ref 70–99)
GLUCOSE-CAPILLARY: 132 mg/dL — AB (ref 70–99)
GLUCOSE-CAPILLARY: 134 mg/dL — AB (ref 70–99)
Glucose-Capillary: 99 mg/dL (ref 70–99)
Glucose-Capillary: 136 mg/dL — ABNORMAL HIGH (ref 70–99)
Glucose-Capillary: 128 mg/dL — ABNORMAL HIGH (ref 70–99)
Glucose-Capillary: 136 mg/dL — ABNORMAL HIGH (ref 70–99)
Glucose-Capillary: 153 mg/dL — ABNORMAL HIGH (ref 70–99)

## 2014-03-02 LAB — PROTIME-INR
INR: 1.93 — ABNORMAL HIGH (ref 0.00–1.49)
Prothrombin Time: 22.1 s — ABNORMAL HIGH (ref 11.6–15.2)

## 2014-03-02 LAB — MAGNESIUM: Magnesium: 2.9 mg/dL — ABNORMAL HIGH (ref 1.5–2.5)

## 2014-03-02 LAB — PLATELET COUNT: PLATELETS: 120 10*3/uL — AB (ref 150–400)

## 2014-03-02 SURGERY — REPLACEMENT, AORTIC VALVE, OPEN
Anesthesia: General | Site: Chest

## 2014-03-02 MED ORDER — POLYVINYL ALCOHOL 1.4 % OP SOLN
1.0000 [drp] | Freq: Two times a day (BID) | OPHTHALMIC | Status: DC
  Administered 2014-03-02 – 2014-03-07 (×10): 1 [drp] via OPHTHALMIC
  Filled 2014-03-02: qty 15

## 2014-03-02 MED ORDER — VANCOMYCIN HCL IN DEXTROSE 1-5 GM/200ML-% IV SOLN
1000.0000 mg | Freq: Once | INTRAVENOUS | Status: AC
  Administered 2014-03-02: 1000 mg via INTRAVENOUS
  Filled 2014-03-02: qty 200

## 2014-03-02 MED ORDER — HEMOSTATIC AGENTS (NO CHARGE) OPTIME
TOPICAL | Status: DC | PRN
  Administered 2014-03-02: 1 via TOPICAL

## 2014-03-02 MED ORDER — SIMVASTATIN 20 MG PO TABS
20.0000 mg | ORAL_TABLET | Freq: Every day | ORAL | Status: DC
  Administered 2014-03-04 – 2014-03-09 (×6): 20 mg via ORAL
  Filled 2014-03-02 (×9): qty 1

## 2014-03-02 MED ORDER — MAGNESIUM SULFATE 4000MG/100ML IJ SOLN
4.0000 g | Freq: Once | INTRAMUSCULAR | Status: AC
  Administered 2014-03-02: 4 g via INTRAVENOUS
  Filled 2014-03-02: qty 100

## 2014-03-02 MED ORDER — MORPHINE SULFATE 2 MG/ML IJ SOLN
1.0000 mg | INTRAMUSCULAR | Status: AC | PRN
  Administered 2014-03-03: 2 mg via INTRAVENOUS

## 2014-03-02 MED ORDER — OXYCODONE HCL 5 MG PO TABS
5.0000 mg | ORAL_TABLET | ORAL | Status: DC | PRN
  Administered 2014-03-04: 5 mg via ORAL
  Filled 2014-03-02: qty 1

## 2014-03-02 MED ORDER — SUCCINYLCHOLINE CHLORIDE 20 MG/ML IJ SOLN
INTRAMUSCULAR | Status: AC
  Filled 2014-03-02: qty 1

## 2014-03-02 MED ORDER — POTASSIUM CHLORIDE 10 MEQ/50ML IV SOLN
10.0000 meq | INTRAVENOUS | Status: AC
  Administered 2014-03-02 (×3): 10 meq via INTRAVENOUS

## 2014-03-02 MED ORDER — SODIUM CHLORIDE 0.45 % IV SOLN
INTRAVENOUS | Status: DC
  Administered 2014-03-02: 20 mL/h via INTRAVENOUS

## 2014-03-02 MED ORDER — FENTANYL CITRATE 0.05 MG/ML IJ SOLN
INTRAMUSCULAR | Status: AC
  Filled 2014-03-02: qty 5

## 2014-03-02 MED ORDER — 0.9 % SODIUM CHLORIDE (POUR BTL) OPTIME
TOPICAL | Status: DC | PRN
  Administered 2014-03-02: 6000 mL

## 2014-03-02 MED ORDER — LACTATED RINGERS IV SOLN
INTRAVENOUS | Status: DC | PRN
  Administered 2014-03-02: 07:00:00 via INTRAVENOUS

## 2014-03-02 MED ORDER — ASPIRIN 81 MG PO CHEW: 324.0000 mg | CHEWABLE_TABLET | Freq: Every day | ORAL | Status: DC

## 2014-03-02 MED ORDER — LIDOCAINE HCL (CARDIAC) 20 MG/ML IV SOLN
INTRAVENOUS | Status: DC | PRN
  Administered 2014-03-02: 60 mg via INTRAVENOUS

## 2014-03-02 MED ORDER — PROPOFOL 10 MG/ML IV BOLUS
INTRAVENOUS | Status: DC | PRN
  Administered 2014-03-02: 20 mg via INTRAVENOUS
  Administered 2014-03-02 (×2): 30 mg via INTRAVENOUS
  Administered 2014-03-02: 20 mg via INTRAVENOUS

## 2014-03-02 MED ORDER — SODIUM CHLORIDE 0.9 % IJ SOLN
3.0000 mL | Freq: Two times a day (BID) | INTRAMUSCULAR | Status: DC
  Administered 2014-03-03 – 2014-03-06 (×3): 3 mL via INTRAVENOUS

## 2014-03-02 MED ORDER — DEXMEDETOMIDINE HCL IN NACL 200 MCG/50ML IV SOLN
0.1000 ug/kg/h | INTRAVENOUS | Status: DC
  Administered 2014-03-02: 0.7 ug/kg/h via INTRAVENOUS

## 2014-03-02 MED ORDER — BISACODYL 10 MG RE SUPP: 10.0000 mg | Freq: Every day | RECTAL | Status: DC

## 2014-03-02 MED ORDER — NITROGLYCERIN IN D5W 200-5 MCG/ML-% IV SOLN: 0.0000 ug/min | INTRAVENOUS | Status: DC

## 2014-03-02 MED ORDER — VITAMIN E 180 MG (400 UNIT) PO CAPS
400.0000 [IU] | ORAL_CAPSULE | Freq: Every evening | ORAL | Status: DC
  Administered 2014-03-04 – 2014-03-09 (×6): 400 [IU] via ORAL
  Filled 2014-03-02 (×9): qty 1

## 2014-03-02 MED ORDER — LACTATED RINGERS IV SOLN: 500.0000 mL | Freq: Once | INTRAVENOUS | Status: AC | PRN

## 2014-03-02 MED ORDER — SODIUM CHLORIDE 0.9 % IR SOLN
Status: DC | PRN
  Administered 2014-03-02: 1000 mL

## 2014-03-02 MED ORDER — NIACIN 500 MG PO TABS
500.0000 mg | ORAL_TABLET | Freq: Two times a day (BID) | ORAL | Status: DC
  Administered 2014-03-02 – 2014-03-10 (×15): 500 mg via ORAL
  Filled 2014-03-02 (×18): qty 1

## 2014-03-02 MED ORDER — HEPARIN SODIUM (PORCINE) 1000 UNIT/ML IJ SOLN
INTRAMUSCULAR | Status: DC | PRN
  Administered 2014-03-02: 20000 [IU] via INTRAVENOUS

## 2014-03-02 MED ORDER — ROCURONIUM BROMIDE 50 MG/5ML IV SOLN
INTRAVENOUS | Status: AC
  Filled 2014-03-02: qty 1

## 2014-03-02 MED ORDER — LIDOCAINE HCL (CARDIAC) 20 MG/ML IV SOLN
INTRAVENOUS | Status: AC
  Filled 2014-03-02: qty 5

## 2014-03-02 MED ORDER — FENTANYL CITRATE 0.05 MG/ML IJ SOLN
INTRAMUSCULAR | Status: DC | PRN
  Administered 2014-03-02 (×2): 50 ug via INTRAVENOUS
  Administered 2014-03-02 (×2): 250 ug via INTRAVENOUS
  Administered 2014-03-02: 50 ug via INTRAVENOUS
  Administered 2014-03-02 (×3): 250 ug via INTRAVENOUS
  Administered 2014-03-02: 100 ug via INTRAVENOUS

## 2014-03-02 MED ORDER — PROTAMINE SULFATE 10 MG/ML IV SOLN
INTRAVENOUS | Status: DC | PRN
  Administered 2014-03-02: 40 mg via INTRAVENOUS
  Administered 2014-03-02: 25 mg via INTRAVENOUS
  Administered 2014-03-02 (×3): 30 mg via INTRAVENOUS
  Administered 2014-03-02: 25 mg via INTRAVENOUS
  Administered 2014-03-02: 20 mg via INTRAVENOUS

## 2014-03-02 MED ORDER — LACTATED RINGERS IV SOLN
INTRAVENOUS | Status: DC | PRN
  Administered 2014-03-02 (×2): via INTRAVENOUS

## 2014-03-02 MED ORDER — CHLORHEXIDINE GLUCONATE 4 % EX LIQD
30.0000 mL | CUTANEOUS | Status: DC
  Filled 2014-03-02: qty 30

## 2014-03-02 MED ORDER — MORPHINE SULFATE 2 MG/ML IJ SOLN
2.0000 mg | INTRAMUSCULAR | Status: DC | PRN
  Filled 2014-03-02: qty 1

## 2014-03-02 MED ORDER — CALCIUM CARB-CHOLECALCIFEROL 600-800 MG-UNIT PO TABS: 1.0000 | ORAL_TABLET | Freq: Two times a day (BID) | ORAL | Status: DC

## 2014-03-02 MED ORDER — PHENYLEPHRINE 40 MCG/ML (10ML) SYRINGE FOR IV PUSH (FOR BLOOD PRESSURE SUPPORT)
PREFILLED_SYRINGE | INTRAVENOUS | Status: AC
  Filled 2014-03-02: qty 20

## 2014-03-02 MED ORDER — SODIUM CHLORIDE 0.9 % IJ SOLN
OROMUCOSAL | Status: DC | PRN
  Administered 2014-03-02: 09:00:00 via TOPICAL

## 2014-03-02 MED ORDER — FAMOTIDINE IN NACL 20-0.9 MG/50ML-% IV SOLN
20.0000 mg | Freq: Two times a day (BID) | INTRAVENOUS | Status: AC
  Administered 2014-03-02: 20 mg via INTRAVENOUS

## 2014-03-02 MED ORDER — LACTATED RINGERS IV SOLN
INTRAVENOUS | Status: DC
  Administered 2014-03-02: 20 mL/h via INTRAVENOUS

## 2014-03-02 MED ORDER — DEXTROSE 5 % IV SOLN
1.5000 g | Freq: Two times a day (BID) | INTRAVENOUS | Status: AC
  Administered 2014-03-02 – 2014-03-04 (×4): 1.5 g via INTRAVENOUS
  Filled 2014-03-02 (×4): qty 1.5

## 2014-03-02 MED ORDER — PROTAMINE SULFATE 10 MG/ML IV SOLN
INTRAVENOUS | Status: AC
  Filled 2014-03-02: qty 25

## 2014-03-02 MED ORDER — SODIUM CHLORIDE 0.9 % IV SOLN: 250.0000 mL | INTRAVENOUS | Status: DC

## 2014-03-02 MED ORDER — PROPOFOL 10 MG/ML IV BOLUS
INTRAVENOUS | Status: AC
  Filled 2014-03-02: qty 20

## 2014-03-02 MED ORDER — PANTOPRAZOLE SODIUM 40 MG PO TBEC
40.0000 mg | DELAYED_RELEASE_TABLET | Freq: Every day | ORAL | Status: DC
  Administered 2014-03-04 – 2014-03-10 (×6): 40 mg via ORAL
  Filled 2014-03-02 (×5): qty 1

## 2014-03-02 MED ORDER — GLYCOPYRROLATE 0.2 MG/ML IJ SOLN
INTRAMUSCULAR | Status: AC
  Filled 2014-03-02: qty 1

## 2014-03-02 MED ORDER — DEXTROSE 5 % IV SOLN: 1.5000 g | INTRAVENOUS | Status: DC | PRN

## 2014-03-02 MED ORDER — ACETAMINOPHEN 160 MG/5ML PO SOLN: 650.0000 mg | Freq: Once | ORAL | Status: AC

## 2014-03-02 MED ORDER — METOPROLOL TARTRATE 1 MG/ML IV SOLN: 2.5000 mg | INTRAVENOUS | Status: DC | PRN

## 2014-03-02 MED ORDER — ACETAMINOPHEN 500 MG PO TABS
1000.0000 mg | ORAL_TABLET | Freq: Four times a day (QID) | ORAL | Status: AC
  Administered 2014-03-03 – 2014-03-07 (×11): 1000 mg via ORAL
  Filled 2014-03-02 (×21): qty 2

## 2014-03-02 MED ORDER — SODIUM CHLORIDE 0.9 % IJ SOLN: 3.0000 mL | INTRAMUSCULAR | Status: DC | PRN

## 2014-03-02 MED ORDER — MIDAZOLAM HCL 10 MG/2ML IJ SOLN
INTRAMUSCULAR | Status: AC
  Filled 2014-03-02: qty 2

## 2014-03-02 MED ORDER — DOCUSATE SODIUM 100 MG PO CAPS
200.0000 mg | ORAL_CAPSULE | Freq: Every day | ORAL | Status: DC
  Administered 2014-03-03 – 2014-03-08 (×5): 200 mg via ORAL
  Filled 2014-03-02 (×8): qty 2

## 2014-03-02 MED ORDER — ACETAMINOPHEN 160 MG/5ML PO SOLN
1000.0000 mg | Freq: Four times a day (QID) | ORAL | Status: AC
  Administered 2014-03-04 – 2014-03-07 (×3): 1000 mg
  Filled 2014-03-02 (×2): qty 40.6

## 2014-03-02 MED ORDER — ZOLPIDEM TARTRATE 5 MG PO TABS
5.0000 mg | ORAL_TABLET | Freq: Every day | ORAL | Status: DC
  Administered 2014-03-05 – 2014-03-09 (×5): 5 mg via ORAL
  Filled 2014-03-02 (×6): qty 1

## 2014-03-02 MED ORDER — METOPROLOL TARTRATE 12.5 MG HALF TABLET
12.5000 mg | ORAL_TABLET | Freq: Two times a day (BID) | ORAL | Status: DC
  Administered 2014-03-03 – 2014-03-04 (×3): 12.5 mg via ORAL
  Filled 2014-03-02 (×7): qty 1

## 2014-03-02 MED ORDER — EPHEDRINE SULFATE 50 MG/ML IJ SOLN
INTRAMUSCULAR | Status: AC
  Filled 2014-03-02: qty 1

## 2014-03-02 MED ORDER — ASPIRIN EC 325 MG PO TBEC
325.0000 mg | DELAYED_RELEASE_TABLET | Freq: Every day | ORAL | Status: DC
  Administered 2014-03-03 – 2014-03-04 (×2): 325 mg via ORAL
  Filled 2014-03-02 (×3): qty 1

## 2014-03-02 MED ORDER — INSULIN REGULAR BOLUS VIA INFUSION
0.0000 [IU] | Freq: Three times a day (TID) | INTRAVENOUS | Status: DC
  Filled 2014-03-02: qty 10

## 2014-03-02 MED ORDER — ALBUMIN HUMAN 5 % IV SOLN
INTRAVENOUS | Status: DC | PRN
  Administered 2014-03-02 (×2): via INTRAVENOUS

## 2014-03-02 MED ORDER — STERILE WATER FOR INJECTION IJ SOLN
INTRAMUSCULAR | Status: AC
  Filled 2014-03-02: qty 10

## 2014-03-02 MED ORDER — MIDAZOLAM HCL 5 MG/5ML IJ SOLN
INTRAMUSCULAR | Status: DC | PRN
  Administered 2014-03-02: 2 mg via INTRAVENOUS
  Administered 2014-03-02 (×5): 1 mg via INTRAVENOUS

## 2014-03-02 MED ORDER — METOCLOPRAMIDE HCL 5 MG/ML IJ SOLN
10.0000 mg | Freq: Four times a day (QID) | INTRAMUSCULAR | Status: AC
  Administered 2014-03-02 – 2014-03-03 (×3): 10 mg via INTRAVENOUS
  Filled 2014-03-02 (×4): qty 2

## 2014-03-02 MED ORDER — ONDANSETRON HCL 4 MG/2ML IJ SOLN
4.0000 mg | Freq: Four times a day (QID) | INTRAMUSCULAR | Status: DC | PRN
  Administered 2014-03-03 (×2): 4 mg via INTRAVENOUS
  Filled 2014-03-02 (×2): qty 2

## 2014-03-02 MED ORDER — SODIUM CHLORIDE 0.9 % IV SOLN
INTRAVENOUS | Status: DC
  Administered 2014-03-02: 10 mL via INTRAVENOUS

## 2014-03-02 MED ORDER — VECURONIUM BROMIDE 10 MG IV SOLR
INTRAVENOUS | Status: DC | PRN
  Administered 2014-03-02: 10 mg via INTRAVENOUS

## 2014-03-02 MED ORDER — METOPROLOL TARTRATE 25 MG/10 ML ORAL SUSPENSION
12.5000 mg | Freq: Two times a day (BID) | ORAL | Status: DC
  Filled 2014-03-02 (×7): qty 5

## 2014-03-02 MED ORDER — PHENYLEPHRINE HCL 10 MG/ML IJ SOLN
0.0000 ug/min | INTRAVENOUS | Status: DC
  Administered 2014-03-02: 60 ug/min via INTRAVENOUS
  Administered 2014-03-02: 50 ug/min via INTRAVENOUS
  Administered 2014-03-03: 10 ug/min via INTRAVENOUS
  Filled 2014-03-02 (×3): qty 2

## 2014-03-02 MED ORDER — ROCURONIUM BROMIDE 100 MG/10ML IV SOLN
INTRAVENOUS | Status: DC | PRN
  Administered 2014-03-02: 50 mg via INTRAVENOUS

## 2014-03-02 MED ORDER — CALCIUM CHLORIDE 10 % IV SOLN
1.0000 g | Freq: Once | INTRAVENOUS | Status: AC
  Administered 2014-03-02: 1 g via INTRAVENOUS
  Filled 2014-03-02: qty 10

## 2014-03-02 MED ORDER — ALBUMIN HUMAN 5 % IV SOLN
250.0000 mL | INTRAVENOUS | Status: AC | PRN
  Administered 2014-03-02 (×4): 250 mL via INTRAVENOUS
  Filled 2014-03-02 (×3): qty 250

## 2014-03-02 MED ORDER — MIDAZOLAM HCL 2 MG/2ML IJ SOLN: 2.0000 mg | INTRAMUSCULAR | Status: DC | PRN

## 2014-03-02 MED ORDER — BISACODYL 5 MG PO TBEC
10.0000 mg | DELAYED_RELEASE_TABLET | Freq: Every day | ORAL | Status: DC
  Administered 2014-03-03 – 2014-03-06 (×4): 10 mg via ORAL
  Filled 2014-03-02 (×5): qty 2

## 2014-03-02 MED ORDER — CALCIUM CARBONATE-VITAMIN D 500-200 MG-UNIT PO TABS
1.0000 | ORAL_TABLET | Freq: Two times a day (BID) | ORAL | Status: DC
  Administered 2014-03-02 – 2014-03-10 (×15): 1 via ORAL
  Filled 2014-03-02 (×17): qty 1

## 2014-03-02 MED ORDER — ALBUMIN HUMAN 5 % IV SOLN
12.5000 g | Freq: Once | INTRAVENOUS | Status: AC
  Administered 2014-03-02: 12.5 g via INTRAVENOUS

## 2014-03-02 MED ORDER — ACETAMINOPHEN 650 MG RE SUPP
650.0000 mg | Freq: Once | RECTAL | Status: AC
  Administered 2014-03-02: 650 mg via RECTAL

## 2014-03-02 MED ORDER — SODIUM CHLORIDE 0.9 % IV SOLN
INTRAVENOUS | Status: DC
  Administered 2014-03-02: 0.8 [IU]/h via INTRAVENOUS
  Filled 2014-03-02 (×2): qty 1

## 2014-03-02 MED ORDER — CARBOXYMETH-GLYCERIN-POLYSORB 0.5-1-0.5 % OP SOLN: 1.0000 [drp] | Freq: Two times a day (BID) | OPHTHALMIC | Status: DC

## 2014-03-02 MED ORDER — NOREPINEPHRINE BITARTRATE 1 MG/ML IV SOLN
10.0000 ug/min | INTRAVENOUS | Status: DC
  Administered 2014-03-03: 10 ug/min via INTRAVENOUS
  Filled 2014-03-02 (×2): qty 4

## 2014-03-02 MED ORDER — POTASSIUM CHLORIDE 10 MEQ/50ML IV SOLN
10.0000 meq | Freq: Once | INTRAVENOUS | Status: AC
  Administered 2014-03-02: 10 meq via INTRAVENOUS

## 2014-03-02 MED ORDER — ADULT MULTIVITAMIN W/MINERALS CH
1.0000 | ORAL_TABLET | Freq: Every evening | ORAL | Status: DC
  Administered 2014-03-04 – 2014-03-09 (×6): 1 via ORAL
  Filled 2014-03-02 (×9): qty 1

## 2014-03-02 MED ORDER — VECURONIUM BROMIDE 10 MG IV SOLR
INTRAVENOUS | Status: AC
  Filled 2014-03-02: qty 10

## 2014-03-02 MED ORDER — ARTIFICIAL TEARS OP OINT
TOPICAL_OINTMENT | OPHTHALMIC | Status: DC | PRN
  Administered 2014-03-02: 1 via OPHTHALMIC

## 2014-03-02 MED FILL — Mannitol IV Soln 20%: INTRAVENOUS | Qty: 500 | Status: AC

## 2014-03-02 MED FILL — Lidocaine HCl IV Inj 20 MG/ML: INTRAVENOUS | Qty: 5 | Status: AC

## 2014-03-02 MED FILL — Heparin Sodium (Porcine) Inj 1000 Unit/ML: INTRAMUSCULAR | Qty: 10 | Status: AC

## 2014-03-02 MED FILL — Sodium Bicarbonate IV Soln 8.4%: INTRAVENOUS | Qty: 50 | Status: AC

## 2014-03-02 MED FILL — Sodium Chloride IV Soln 0.9%: INTRAVENOUS | Qty: 2000 | Status: AC

## 2014-03-02 MED FILL — Electrolyte-R (PH 7.4) Solution: INTRAVENOUS | Qty: 3000 | Status: AC

## 2014-03-02 SURGICAL SUPPLY — 77 items
ADAPTER CARDIO PERF ANTE/RETRO (ADAPTER) ×4 IMPLANT
APPLICATOR COTTON TIP 6IN STRL (MISCELLANEOUS) IMPLANT
ATTRACTOMAT 16X20 MAGNETIC DRP (DRAPES) ×4 IMPLANT
BAG DECANTER FOR FLEXI CONT (MISCELLANEOUS) ×4 IMPLANT
BLADE STERNUM SYSTEM 6 (BLADE) ×4 IMPLANT
BLADE SURG 15 STRL LF DISP TIS (BLADE) ×2 IMPLANT
BLADE SURG 15 STRL SS (BLADE) ×2
CANISTER SUCTION 2500CC (MISCELLANEOUS) ×4 IMPLANT
CANNULA EZ GLIDE AORTIC 21FR (CANNULA) ×4 IMPLANT
CANNULA GUNDRY RCSP 15FR (MISCELLANEOUS) ×4 IMPLANT
CATH CPB KIT HENDRICKSON (MISCELLANEOUS) ×4 IMPLANT
CATH HEART VENT LEFT (CATHETERS) ×2 IMPLANT
CATH ROBINSON RED A/P 18FR (CATHETERS) ×8 IMPLANT
CATH THORACIC 36FR RT ANG (CATHETERS) ×8 IMPLANT
CLIP FOGARTY SPRING 6M (CLIP) IMPLANT
COVER SURGICAL LIGHT HANDLE (MISCELLANEOUS) ×4 IMPLANT
CRADLE DONUT ADULT HEAD (MISCELLANEOUS) ×4 IMPLANT
DRAPE SLUSH/WARMER DISC (DRAPES) ×4 IMPLANT
DRSG COVADERM 4X14 (GAUZE/BANDAGES/DRESSINGS) ×4 IMPLANT
ELECT REM PT RETURN 9FT ADLT (ELECTROSURGICAL) ×8
ELECTRODE REM PT RTRN 9FT ADLT (ELECTROSURGICAL) ×4 IMPLANT
GAUZE SPONGE 4X4 12PLY STRL (GAUZE/BANDAGES/DRESSINGS) ×8 IMPLANT
GLOVE BIO SURGEON STRL SZ 6.5 (GLOVE) ×3 IMPLANT
GLOVE BIO SURGEONS STRL SZ 6.5 (GLOVE) ×1
GLOVE EUDERMIC 7 POWDERFREE (GLOVE) IMPLANT
GLOVE SURG SIGNA 7.5 PF LTX (GLOVE) ×12 IMPLANT
GOWN STRL REUS W/ TWL LRG LVL3 (GOWN DISPOSABLE) ×8 IMPLANT
GOWN STRL REUS W/ TWL XL LVL3 (GOWN DISPOSABLE) ×2 IMPLANT
GOWN STRL REUS W/TWL LRG LVL3 (GOWN DISPOSABLE) ×8
GOWN STRL REUS W/TWL XL LVL3 (GOWN DISPOSABLE) ×2
HEMOSTAT POWDER SURGIFOAM 1G (HEMOSTASIS) ×12 IMPLANT
HEMOSTAT SURGICEL 2X14 (HEMOSTASIS) ×4 IMPLANT
INSERT FOGARTY XLG (MISCELLANEOUS) IMPLANT
KIT BASIN OR (CUSTOM PROCEDURE TRAY) ×4 IMPLANT
KIT ROOM TURNOVER OR (KITS) ×4 IMPLANT
KIT SUCTION CATH 14FR (SUCTIONS) ×8 IMPLANT
LINE EXTENSION DELIVERY (MISCELLANEOUS) ×4 IMPLANT
LINE VENT (MISCELLANEOUS) ×8 IMPLANT
NS IRRIG 1000ML POUR BTL (IV SOLUTION) ×20 IMPLANT
PACK OPEN HEART (CUSTOM PROCEDURE TRAY) ×4 IMPLANT
PAD ARMBOARD 7.5X6 YLW CONV (MISCELLANEOUS) ×8 IMPLANT
SET CARDIOPLEGIA MPS 5001102 (MISCELLANEOUS) ×4 IMPLANT
SPONGE GAUZE 4X4 12PLY STER LF (GAUZE/BANDAGES/DRESSINGS) ×8 IMPLANT
SUT BONE WAX W31G (SUTURE) ×4 IMPLANT
SUT ETHIBON 2 0 V 52N 30 (SUTURE) ×12 IMPLANT
SUT ETHIBON EXCEL 2-0 V-5 (SUTURE) IMPLANT
SUT ETHIBOND 2 0 SH (SUTURE) ×2
SUT ETHIBOND 2 0 SH 36X2 (SUTURE) ×2 IMPLANT
SUT ETHIBOND 2 0 V4 (SUTURE) IMPLANT
SUT ETHIBOND 2 0V4 GREEN (SUTURE) IMPLANT
SUT ETHIBOND 4 0 RB 1 (SUTURE) IMPLANT
SUT ETHIBOND V-5 VALVE (SUTURE) IMPLANT
SUT PROLENE 3 0 SH 1 (SUTURE) IMPLANT
SUT PROLENE 3 0 SH DA (SUTURE) ×4 IMPLANT
SUT PROLENE 4 0 RB 1 (SUTURE) ×14
SUT PROLENE 4 0 SH DA (SUTURE) ×12 IMPLANT
SUT PROLENE 4-0 RB1 .5 CRCL 36 (SUTURE) ×14 IMPLANT
SUT SILK  1 MH (SUTURE) ×4
SUT SILK 1 MH (SUTURE) ×4 IMPLANT
SUT STEEL 6MS V (SUTURE) IMPLANT
SUT STEEL SZ 6 DBL 3X14 BALL (SUTURE) IMPLANT
SUT VIC AB 1 CTX 36 (SUTURE) ×4
SUT VIC AB 1 CTX36XBRD ANBCTR (SUTURE) ×4 IMPLANT
SUT VIC AB 2-0 CTX 27 (SUTURE) IMPLANT
SUT VIC AB 3-0 X1 27 (SUTURE) IMPLANT
SUTURE E-PAK OPEN HEART (SUTURE) ×4 IMPLANT
SYR 10ML KIT SKIN ADHESIVE (MISCELLANEOUS) IMPLANT
SYSTEM SAHARA CHEST DRAIN ATS (WOUND CARE) ×4 IMPLANT
TABLE PACK (MISCELLANEOUS) ×4 IMPLANT
TAPE CLOTH SURG 4X10 WHT LF (GAUZE/BANDAGES/DRESSINGS) ×8 IMPLANT
TOWEL OR 17X24 6PK STRL BLUE (TOWEL DISPOSABLE) ×8 IMPLANT
TOWEL OR 17X26 10 PK STRL BLUE (TOWEL DISPOSABLE) ×8 IMPLANT
TRAY FOLEY IC TEMP SENS 16FR (CATHETERS) ×4 IMPLANT
UNDERPAD 30X30 INCONTINENT (UNDERPADS AND DIAPERS) ×4 IMPLANT
VALVE MAGNA EASE 21MM (Prosthesis & Implant Heart) ×4 IMPLANT
VENT LEFT HEART 12002 (CATHETERS) ×4
WATER STERILE IRR 1000ML POUR (IV SOLUTION) ×8 IMPLANT

## 2014-03-02 NOTE — Anesthesia Procedure Notes (Addendum)
Procedure Name: Intubation Date/Time: 03/02/2014 8:04 AM Performed by: Leonel Ramsay'LAUGHLIN, Anija Brickner H Pre-anesthesia Checklist: Patient identified, Timeout performed, Emergency Drugs available, Suction available and Patient being monitored Patient Re-evaluated:Patient Re-evaluated prior to inductionOxygen Delivery Method: Circle system utilized Preoxygenation: Pre-oxygenation with 100% oxygen Intubation Type: IV induction Ventilation: Oral airway inserted - appropriate to patient size and Mask ventilation without difficulty Laryngoscope Size: Miller and 2 Grade View: Grade II Tube type: Oral Tube size: 8.0 mm Airway Equipment and Method: Stylet and Oral airway Placement Confirmation: ETT inserted through vocal cords under direct vision,  positive ETCO2 and breath sounds checked- equal and bilateral Secured at: 21 cm Tube secured with: Tape Dental Injury: Teeth and Oropharynx as per pre-operative assessment

## 2014-03-02 NOTE — Anesthesia Postprocedure Evaluation (Signed)
  Anesthesia Post-op Note  Patient: Sara Donaldson  Procedure(s) Performed: Procedure(s): AORTIC VALVE REPLACEMENT (AVR) (N/A)  Patient Location: ICU  Anesthesia Type:General  Level of Consciousness: sedated  Airway and Oxygen Therapy: Patient remains intubated per anesthesia plan  Post-op Pain: none  Post-op Assessment: Post-op Vital signs reviewed, Patient's Cardiovascular Status Stable, Respiratory Function Stable and Patent Airway  Post-op Vital Signs: Reviewed and stable  Last Vitals:  Filed Vitals:   03/02/14 1637  BP: 122/51  Pulse: 90  Temp: 36.5 C  Resp: 19    Complications: No apparent anesthesia complications

## 2014-03-02 NOTE — Brief Op Note (Addendum)
03/02/2014  10:34 AM  PATIENT:  Sara Donaldson  80 y.o. female  PRE-OPERATIVE DIAGNOSIS:  AS  POST-OPERATIVE DIAGNOSIS:  aortic stenosis  PROCEDURE:  Procedure(s):  AORTIC VALVE REPLACEMENT  -21 mm [employer] Magn[employer] Ease Pericardial Tissue Valve  SURGEON:  Surgeon(s) and Role:    * Loreli SlotSteven C Danyal Whitenack, MD - Primary  PHYSICIAN ASSISTANT: Lowella DandyErin Barrett PA-C  ANESTHESIA:   general  EBL:  Total I/O In: -  Out: 475 [Urine:475]  BLOOD ADMINISTERED: CELLSAVER  DRAINS: Mediastinal chest drains   LOCAL MEDICATIONS USED:  NONE  SPECIMEN:  Source of Specimen:  Aortic Valve  DISPOSITION OF SPECIMEN:  PATHOLOGY  COUNTS:  YES  TOURNIQUET:  * No tourniquets in log *  DICTATION: .Dragon Dictation  PLAN OF CARE: Admit to inpatient   PATIENT DISPOSITION:  ICU - intubated and hemodynamically stable.   Delay start of Pharmacological VTE agent (>24hrs) due to surgical blood loss or risk of bleeding: yes    Aortic Valve Etiology   Aortic Insufficiency:  Severe  Aortic Valve Disease:  Yes.  Aortic Stenosis:  Yes. Smallest Aortic Valve Area: 1.08cm2; Highest Mean Gradient: 33 mmHg.  Etiology (Choose at least one and up to  5 etiologies):  Degenerative - Calcified  Aortic Valve  Procedure Performed:  Replacement: Yes.  Bioprosthetic Valve. Implant Model Number:[id number], Size:21, Unique Device Identifier:[id number]  Repair/Reconstruction: No.   Aortic Annular Enlargement: No.    XC= 68 min CPB= 90 min

## 2014-03-02 NOTE — Anesthesia Preprocedure Evaluation (Addendum)
Anesthesia Evaluation  Patient identified by MRN, date of birth, ID band Patient awake    Reviewed: Allergy & Precautions, H&P , NPO status , Patient's Chart, lab work & pertinent test results  History of Anesthesia Complications (+) history of anesthetic complications  Airway Mallampati: II TM Distance: >3 FB Neck ROM: Full    Dental  (+) Dental Advisory Given   Pulmonary neg pulmonary ROS,          Cardiovascular hypertension, Pt. on medications + Valvular Problems/Murmurs AS     Neuro/Psych negative neurological ROS  negative psych ROS   GI/Hepatic negative GI ROS, Neg liver ROS,   Endo/Other  negative endocrine ROS  Renal/GU negative Renal ROS     Musculoskeletal negative musculoskeletal ROS (+)   Abdominal   Peds  Hematology negative hematology ROS (+)   Anesthesia Other Findings   Reproductive/Obstetrics negative OB ROS                          Anesthesia Physical Anesthesia Plan  ASA: III  Anesthesia Plan: General   Post-op Pain Management:    Induction: Intravenous  Airway Management Planned: Oral ETT  Additional Equipment: Arterial line, CVP, PA Cath and 3D TEE  Intra-op Plan:   Post-operative Plan: Extubation in OR and Post-operative intubation/ventilation  Informed Consent: I have reviewed the patients History and Physical, chart, labs and discussed the procedure including the risks, benefits and alternatives for the proposed anesthesia with the patient or authorized representative who has indicated his/her understanding and acceptance.   Dental advisory given  Plan Discussed with: CRNA, Anesthesiologist and Surgeon  Anesthesia Plan Comments:        Anesthesia Quick Evaluation

## 2014-03-02 NOTE — H&P (View-Only) (Signed)
PCP is CONROY,NATHAN, PA-C Referring Provider is Kadakia, Ajay S, MD  Chief Complaint  Patient presents with  . Aortic Stenosis    Surgical eval for possible AVR, TEE 01/12/14, Cadiac Cath 10/24/13    HPI: 80-year-old woman who presents with chief complaints of shortness of breath and lightheadedness with exertion.  Sara Donaldson is an 80-year-old woman who has a history of hypertension and hypercholesterolemia, but for the most part has been healthy and without significant physical limitations. She has had a heart murmur for a long time. She was doing well until April of this year. She was working in her garden on April 1 when she says that she felt like she was going to blackout. She felt very lightheaded and dizzy and was having trouble catching her breath. She went to the emergency room. A head CT showed no pathology to explain her symptoms. She did have chronic microvascular disease. She had a cardiac catheterization which showed normal coronary arteries. Her aortic valve gradient was 31 mm of mercury. Her valve area calculated at 0.95 cm. Decision was made to manage her medically with plans for followup echocardiography in one year.  In the interim since that time she continued to have very predictable presyncopal episodes and shortness of breath with exertion. She says that she gets every time she walks to her mailbox which is about 300 feet from her house. She has not had any of these symptoms at rest. She recently had a transesophageal echocardiogram which showed a gradient of 33 mm of mercury. Her valve area was 1.04 by planimetry. He also has moderate to severe aortic insufficiency.   Past Medical History  Diagnosis Date  . Hypertension     Past Surgical History  Procedure Laterality Date  . Tee without cardioversion N/A 01/12/2014    Procedure: TRANSESOPHAGEAL ECHOCARDIOGRAM (TEE);  Surgeon: Ajay S Kadakia, MD;  Location: [facility];  Service: Cardiovascular;  Laterality: N/A;  .  Eye surgery      Family history  Extensive family history of heart disease with both her mother and father dying of heart disease a relatively young ages. Also history of heart disease in an aunt and on call and grandfather. Grandmother died of cancer   Social History History  Substance Use Topics  . Smoking status: Never Smoker   . Smokeless tobacco: Not on file  . Alcohol Use: No    Current Outpatient Prescriptions  Medication Sig Dispense Refill  . aspirin EC 81 MG tablet Take 81 mg by mouth daily at 12 noon.       . Calcium Carb-Cholecalciferol (CALCIUM 600+D3) 600-800 MG-UNIT TABS Take 1 tablet by mouth 2 (two) times daily.      . Carboxymeth-Glycerin-Polysorb (REFRESH OPTIVE ADVANCED) 0.5-1-0.5 % SOLN Place 1 drop into both eyes 2 (two) times daily.      . doxazosin (CARDURA) 2 MG tablet Take 1 mg by mouth daily at 12 noon.       . hydrochlorothiazide (HYDRODIURIL) 25 MG tablet Take 12.5 mg by mouth daily.      . lovastatin (MEVACOR) 20 MG tablet Take 20 mg by mouth 2 (two) times daily.      . Multiple Vitamin (MULTIVITAMIN WITH MINERALS) TABS tablet Take 1 tablet by mouth every evening. 4pm (Centrum Silver)      . niacin 500 MG tablet Take 500 mg by mouth 2 (two) times daily.      . verapamil (CALAN-SR) 240 MG CR tablet Take 240 mg by mouth 2 (  two) times daily.       . vitamin E 400 UNIT capsule Take 400 Units by mouth every evening. 4pm      . zolpidem (AMBIEN) 5 MG tablet Take 5 mg by mouth at bedtime.       No current facility-administered medications for this visit.    Allergies  Allergen Reactions  . Trazodone And Nefazodone Hives    Review of Systems  Constitutional: Positive for fatigue.  HENT: Negative for dental problem.   Respiratory: Positive for cough and shortness of breath (with exertion).        Has been told she has COPD, denies wheezing or use of inhalers  Cardiovascular: Negative for chest pain.  Neurological: Positive for dizziness and  light-headedness (Exertion). Negative for syncope.  Hematological: Negative for adenopathy. Does not bruise/bleed easily.  Psychiatric/Behavioral: The patient is nervous/anxious.   All other systems reviewed and are negative.   BP 118/64  Pulse 90  Resp 20  Ht 5' 1" (1.549 m)  Wt 140 lb (63.504 kg)  BMI 26.47 kg/m2  SpO2 96% Physical Exam  Vitals reviewed. Constitutional: She is oriented to person, place, and time. She appears well-developed and well-nourished. No distress.  HENT:  Head: Normocephalic and atraumatic.  Eyes: EOM are normal.  Neck: Neck supple. No thyromegaly present.  Positive transmitted murmur bilaterally  Cardiovascular: Normal rate, regular rhythm and intact distal pulses.   Murmur (3/6 crescendo decrescendo harsh systolic murmur) heard. Pulmonary/Chest: Effort normal and breath sounds normal. She has no wheezes. She has no rales.  Abdominal: Soft. There is no tenderness.  Musculoskeletal: She exhibits no edema.  Lymphadenopathy:    She has no cervical adenopathy.  Neurological: She is alert and oriented to person, place, and time. No cranial nerve deficit.  Skin: Skin is warm and dry.     Diagnostic Tests: TE ECHOCARDIOGRAM 01/12/2014  Study Conclusions  - Left ventricle: There was moderate concentric hypertrophy. Systolic function was normal. Wall motion was normal; there were no regional wall motion abnormalities. - Aortic valve: Cusp separation was severely reduced. Valve mobility was restricted. There was moderate to severe regurgitation. Valve area (VTI): 1.1 cm^2. Valve area (Vmax): 1.08 cm^2. - Mitral valve: There was mild regurgitation. - Left atrium: The atrium was mildly dilated. No evidence of thrombus in the atrial cavity or appendage. - Right atrium: No evidence of thrombus in the atrial cavity or appendage. - Atrial septum: The septum bowed from right to left, consistent with increased right atrial pressure. No defect or patent  foramen ovale was identified. - Superior vena cava: The study excluded a thrombus.  CARDIAC CATHETERIZATION 10/24/2013 HEMODYNAMICS: Pulmonary pressure was 26/10: Wedge pressure was : RV pressure was 20/0: RA pressure was 6/2.  Oxygen saturation of blood sample from PA was 59 % and AO was 90 %.  Aortic pressure was 147/58; LV pressure was 174/3; LVEDP 8. There was 31 mm gradient between the left ventricle and aorta. AV area was 0.95 cm2.  Cardiac output was 3.26 L/min by Fick method and 4.13 L/Min by Thermodilution.  ANGIOGRAM/CORONARY ARTERIOGRAM: The left main coronary artery is unremarkable.  The left anterior descending artery is unremarkable. Diagonal is unremarkable.  The left circumflex artery has mild (20-30 %) proximal disease. OM is unremarkable.  The right coronary artery is dominant and has mild calcification, It has mild (20 %) proximal disease  LEFT VENTRICULOGRAM: Left ventricular angiogram was done in the 30 RAO projection and revealed normal left ventricular wall motion and   systolic function with an estimated ejection fraction of 70 %. LVEDP was 8 mmHg. Significant calcific AV.  IMPRESSION OF HEART CATHETERIZATION:  1. Normal left main coronary artery. 2. Normal left anterior descending artery and its branches. 3. Mild disease of left circumflex artery and its branches. 4. Mild disease right coronary artery. 5. Normal left ventricular systolic function. LVEDP 8 mmHg. Ej. fraction 70 %. 6. Normal PA and wedge pressures. 7. Moderate AS.  RECOMMENDATION:  Medical treatment.  If symptoms persist then TEE for AV evaluation.  Impression: 80-year-old woman who is in generally good health who has moderately severe aortic stenosis and moderately severe aortic insufficiency. Her valve area calculated at 0.95 cm a catheterization. However she is symptomatic. With questioning about her symptoms, they are very consistent with generally the same amount of exertion each time bringing  the symptoms on. They're relieved by rest. She's not had any lightheadedness or dizziness at rest or with minimal exertion. Her symptomatology is completely consistent with the etiology being cardiac in origin.  I discussed with her the indications, risks, benefits, and alternatives. She does understand that her valve area does not fall into the range where that alone is an indication for surgery. However, I think that with classic symptomatology there is a sufficient indication to replace the aortic valve. She does understand that continued followup and limitation of activities to avoid symptoms is a reasonable alternative.   We discussed using a tissue valve in preference to a mechanical valve given her age. She is in agreement with that and does not want to be on lifelong anticoagulation.  I discussed with her the general nature of the procedure including the need for general anesthesia, the incision to be used, the use of cardiopulmonary bypass, expected hospital stay, and overall recovery. She understands the risks include, but are not limited to, death, MI, DVT, PE, bleeding, possible transfusion, stroke, other neurologic dysfunction, infection, heart block, cardiac arrhythmias, other organ system dysfunction including respiratory, renal, or GI complications. She understands and accepts the risks and does wish to proceed.  She has not seen a dentist in some time. She does not have any teeth that are loose or painful. I recommended she see her dentist for a cleaning and general checkup prior to the aortic valve replacement.  Plan: Aortic valve replacement with a tissue valve on Monday, August 17. She will be admitted on the day of surgery. 

## 2014-03-02 NOTE — Interval H&P Note (Signed)
History and Physical Interval Note:  03/02/2014 7:24 AM  Sara Donaldson  has presented today for surgery, with the diagnosis of AS  The various methods of treatment have been discussed with the patient and family. After consideration of risks, benefits and other options for treatment, the patient has consented to  Procedure(s): AORTIC VALVE REPLACEMENT (AVR) (N/A) INTRAOPERATIVE TRANSESOPHAGEAL ECHOCARDIOGRAM (N/A) as a surgical intervention .  The patient's history has been reviewed, patient examined, no change in status, stable for surgery.  I have reviewed the patient's chart and labs.  Questions were answered to the patient's satisfaction.     Kersti Scavone C

## 2014-03-02 NOTE — Transfer of Care (Signed)
Immediate Anesthesia Transfer of Care Note  Patient: Sara Donaldson  Procedure(s) Performed: Procedure(s): AORTIC VALVE REPLACEMENT (AVR) (N/A)  Patient Location: SICU  Anesthesia Type:General  Level of Consciousness: sedated and Patient remains intubated per anesthesia plan  Airway & Oxygen Therapy: Patient remains intubated per anesthesia plan and Patient placed on Ventilator (see vital sign flow sheet for setting)  Post-op Assessment: Report given to PACU RN and Post -op Vital signs reviewed and stable  Post vital signs: Reviewed and stable  Complications: No apparent anesthesia complications

## 2014-03-02 NOTE — Procedures (Signed)
Extubation Procedure Note  Patient Details:   Name: Sol Blazingina M Kozikowski DOB: June 24, 1930 MRN: [id number]   Airway Documentation:  Airway 8 mm (Active)  Secured at (cm) 21 cm 03/02/2014  4:57 PM  Measured From Lips 03/02/2014  4:57 PM  Secured Location Right 03/02/2014  4:57 PM  Secured By Pink Tape 03/02/2014  4:57 PM  Site Condition Dry 03/02/2014  4:57 PM    Evaluation  O2 sats: stable throughout Complications: No apparent complications Patient did tolerate procedure well. Bilateral Breath Sounds: Clear;Diminished Suctioning: Oral;Airway Yes  Patient extubated to 4LNC.  NIF of -20.  VC of 700.  Patient able to speak after.  Sats currently 100%.  Positive cuff leak.  IS performed x5 with 500 goal.  Ledell NossBrown, Charae Depaolis N 03/02/2014, 6:00 PM

## 2014-03-02 NOTE — Progress Notes (Signed)
Patient ID: Sara Donaldson, female   DOB: February 02, 1930, 80 y.o.   MRN: [id number]   SICU Evening Rounds:   Hemodynamically stable on neo for vasodilatation. CI = 2.2  extubated  Urine output good  CT output low  CBC    Component Value Date/Time   WBC 10.0 03/02/2014 1232   RBC 3.28* 03/02/2014 1232   HGB 10.0* 03/02/2014 1232   HCT 29.5* 03/02/2014 1232   PLT 104* 03/02/2014 1232   MCV 89.9 03/02/2014 1232   MCH 30.5 03/02/2014 1232   MCHC 33.9 03/02/2014 1232   RDW 12.9 03/02/2014 1232   LYMPHSABS 1.5 10/15/2013 1347   MONOABS 0.7 10/15/2013 1347   EOSABS 0.0 10/15/2013 1347   BASOSABS 0.0 10/15/2013 1347     BMET    Component Value Date/Time   NA 139 03/02/2014 1229   K 3.3* 03/02/2014 1229   CL 98 03/02/2014 0919   CO2 23 02/26/2014 1547   GLUCOSE 101* 03/02/2014 1229   BUN 7 03/02/2014 0919   CREATININE 0.30* 03/02/2014 0919   CALCIUM 9.3 02/26/2014 1547   GFRNONAA 84* 02/26/2014 1547   GFRAA >90 02/26/2014 1547     A/P:  Stable postop course. Continue current plans.

## 2014-03-02 NOTE — Progress Notes (Signed)
Utilization Review Completed.Sara Donaldson T8/17/2015  

## 2014-03-02 NOTE — Op Note (Signed)
NAMArlina Robes:  Donaldson, Sara Donaldson                  ACCOUNT NO.:  [id number]  MEDICAL RECORD NO.:  [id number]  LOCATION:  2S09C                        FACILITY:  [facility]  PHYSICIAN:  Salvatore DecentSteven C. Dorris FetchHendrickson, M.D.DATE OF BIRTH:  09-Jun-1930  DATE OF PROCEDURE:  03/02/2014 DATE OF DISCHARGE:                              OPERATIVE REPORT   PREOPERATIVE DIAGNOSIS:  Moderate-to-severe aortic stenosis and aortic insufficiency.  POSTOPERATIVE DIAGNOSIS:  Moderate-to-severe aortic stenosis and aortic insufficiency.  PROCEDURE:  Median sternotomy, extracorporeal circulation, aortic valve replacement with 21 mm Magna Ease pericardial valve (model 3300TFX, serial #[id number]).  SURGEON:  Salvatore DecentSteven C. Dorris FetchHendrickson, MD  ASSISTANT:  Lowella DandyErin Barrett, PA-C.  ANESTHESIA:  General.  FINDINGS:  Anesthesia unable to place transesophageal echocardiography probe.  Aorta- normal.  Aortic valve- tricuspid, calcific, stenotic and insufficient degenerative valve.  CLINICAL NOTE:  Sara Donaldson is an 80 year old woman with exertional presyncope.  Workup revealed moderate-to-severe aortic stenosis with moderate-to-severe aortic insufficiency.  Her valve gradient was 31 mmHg and valve area calculated at 0.95 cm2.  She was initially treated medically, but continued to have symptoms of presyncope and shortness of breath with exertion.  She wished to have aortic valve replacement.  The indications, risks, benefits, and alternatives were discussed in detail with the patient.  She understood and accepted the risks and wished to proceed.  She did wish to have a bioprosthetic valve placed.  OPERATIVE NOTE:  Sara Donaldson was brought to the preoperative holding area on March 02, 2014.  There Anesthesia placed a Swan-Ganz catheter and arterial blood pressure monitoring line.  She was taken to the operating room, anesthetized, and intubated.  A Foley catheter was placed.  An attempt was made to pass the transesophageal echocardiography  probe, however after multiple attempts, the probe would not pass and we proceeded without transesophageal echo.  The chest, abdomen, and legs were prepped and draped in the usual sterile fashion.  A median sternotomy was performed.  Initial hemostasis was achieved. A retractor was placed.  The pericardium was opened.  The patient was fully heparinized.  After confirming adequate anticoagulation with ACT measurement, the aorta was cannulated via concentric 2-0 Ethibond pledgeted pursestring sutures.  A dual-stage venous cannula was placed via a pursestring suture in the right atrial appendage.  Cardiopulmonary bypass was instituted and the patient was cooled to 32 degrees Celsius. The left ventricular vent was placed via a pursestring suture in the right superior pulmonary vein.  A retrograde cardioplegic cannula was passed through a pursestring in the right atrium into the coronary sinus.  An antegrade cardioplegia cannula was placed in the ascending aorta. CO2 was insufflated into the operative field.  The aorta was crossclamped.  The left ventricle was emptied with the vents.  Cardiac arrest then was achieved with a combination of cold antegrade and retrograde blood cardioplegia and topical iced saline. Initial cardioplegia given antegrade resulted in very slow cooling, so the majority of the cardioplegia was given retrograde.  There was a good diastolic arrest and septal cooling to 9 degrees Celsius.  Additional doses of cardioplegia were given at 20 minute intervals during the crossclamp portion of the procedure.  An aortotomy was made and extended  into the non-coronary sinus.  The aorta itself was not aneurysmal.  The valve was inspected.  It was a degenerative tricuspid valve with calcification and incomplete coaptation of the leaflets.  The valve leaflets were excised.  There was moderate annular calcification.  This was debrided.  Care was taken to contain all calcific debris.   The anulus was copiously irrigated with iced saline.  The coronary ostia were inspected and were in their normal locations.  There was no calcification or stenosis at the coronary ostia.  The anulus sized for a 21 mm [facility] Ease pericardial tissue valve.  The valve was prepared per manufacturer's recommendations.  2-0 Ethibond horizontal mattress sutures were placed around the anulus circumferentially, 15 sutures were utilized in all.  Sutures then were passed through the sewing ring of the valve.  The valve was lowered into place, it seated well.  Sutures were sequentially tied.  The leaflets were spread periodically to ensure the valve was well seated.  After the valve was in place and the sutures had all been tied, the anulus was probed with a fine tip right-angle and no gaps were noted.  The coronary ostia were inspected and there was no impingement.  Rewarming was begun. The aortotomy was closed in 2 layers.  The first layer was a running 4-0 Prolene horizontal mattress suture.  At the completion of this layer, the patient was placed in Trendelenburg position.  The heart was filled with blood and the lungs were inflated.  Initial de-airing was performed as the suture was tied.  The heart then was again emptied and the lungs deflated.  The second layer of the aortic closure was a running 4-0 Prolene suture. After completing the aortotomy closure, the patient remained in Trendelenburg position.  The left ventricle and aortic root were again de-aired.  A warm dose of 500 mL of retrograde cardioplegia was administered,and the aortic crossclamp was removed. The total crossclamp time was 68 minutes.  The patient spontaneously regained sinus rhythm and did not require defibrillation.  As rewarming was completed, the aortotomy was inspected for hemostasis. The retrograde cardioplegia cannula was removed as was the left ventricular vent.  Additional de-airing was performed as the LV  vent was removed.  When the patient had rewarmed to a core temperature of 37 degrees Celsius, she was weaned from cardiopulmonary bypass on the first attempt without difficulty.  She was in sinus rhythm initially, but was paced due to relative bradycardia.  She was atrially paced at 80 beats per minute.  As noted, she weaned from bypass on the first attempt.  The total bypass time was 90 minutes.  A test dose of protamine was administered and was well tolerated.  The atrial and aortic cannulae were removed.  The remainder of the protamine was administered without incident.  The chest was irrigated with warm saline.  Hemostasis was achieved.  A 36-French chest tube was placed in the anterior mediastinum and a 28-French Blake drain was placed along the diaphragmatic surface of the pericardium, these were secured with #1 silk sutures.  The sternum was closed with a combination of single and double heavy gauge stainless steel wires.  The pectoralis fascia, subcutaneous tissue, and skin were closed in standard fashion. All sponge, needle, and instrument counts were correct at the end of the procedure.  The patient was taken from the operating room to the surgical intensive care unit in good condition.     Salvatore Decent Dorris Fetch, M.D.  SCH/MEDQ  D:  03/02/2014  T:  03/02/2014  Job:  [id number]

## 2014-03-03 ENCOUNTER — Inpatient Hospital Stay (HOSPITAL_COMMUNITY): Payer: Medicare Other

## 2014-03-03 LAB — POCT I-STAT, CHEM 8
BUN: 15 mg/dL (ref 6–23)
Calcium, Ion: 1.3 mmol/L (ref 1.13–1.30)
Chloride: 108 mEq/L (ref 96–112)
Creatinine, Ser: 0.8 mg/dL (ref 0.50–1.10)
Glucose, Bld: 111 mg/dL — ABNORMAL HIGH (ref 70–99)
HCT: 24 % — ABNORMAL LOW (ref 36.0–46.0)
Hemoglobin: 8.2 g/dL — ABNORMAL LOW (ref 12.0–15.0)
POTASSIUM: 4.1 meq/L (ref 3.7–5.3)
Sodium: 138 mEq/L (ref 137–147)
TCO2: 20 mmol/L (ref 0–100)

## 2014-03-03 LAB — GLUCOSE, CAPILLARY
GLUCOSE-CAPILLARY: 113 mg/dL — AB (ref 70–99)
GLUCOSE-CAPILLARY: 125 mg/dL — AB (ref 70–99)
Glucose-Capillary: 110 mg/dL — ABNORMAL HIGH (ref 70–99)
Glucose-Capillary: 115 mg/dL — ABNORMAL HIGH (ref 70–99)
Glucose-Capillary: 158 mg/dL — ABNORMAL HIGH (ref 70–99)
Glucose-Capillary: 163 mg/dL — ABNORMAL HIGH (ref 70–99)
Glucose-Capillary: 90 mg/dL (ref 70–99)
Glucose-Capillary: 97 mg/dL (ref 70–99)

## 2014-03-03 LAB — CBC
HCT: 22.3 % — ABNORMAL LOW (ref 36.0–46.0)
HCT: 27 % — ABNORMAL LOW (ref 36.0–46.0)
Hemoglobin: 7.6 g/dL — ABNORMAL LOW (ref 12.0–15.0)
Hemoglobin: 9.1 g/dL — ABNORMAL LOW (ref 12.0–15.0)
MCH: 30.8 pg (ref 26.0–34.0)
MCH: 29.9 pg (ref 26.0–34.0)
MCHC: 34.1 g/dL (ref 30.0–36.0)
MCHC: 33.7 g/dL (ref 30.0–36.0)
MCV: 90.3 fL (ref 78.0–100.0)
MCV: 88.8 fL (ref 78.0–100.0)
Platelets: 66 10*3/uL — ABNORMAL LOW (ref 150–400)
Platelets: 51 10*3/uL — ABNORMAL LOW (ref 150–400)
RBC: 2.47 MIL/uL — ABNORMAL LOW (ref 3.87–5.11)
RBC: 3.04 MIL/uL — AB (ref 3.87–5.11)
RDW: 13.4 % (ref 11.5–15.5)
RDW: 15.1 % (ref 11.5–15.5)
WBC: 13.4 10*3/uL — ABNORMAL HIGH (ref 4.0–10.5)
WBC: 10.2 10*3/uL (ref 4.0–10.5)

## 2014-03-03 LAB — BASIC METABOLIC PANEL
Anion gap: 17 — ABNORMAL HIGH (ref 5–15)
BUN: 10 mg/dL (ref 6–23)
CO2: 17 mEq/L — ABNORMAL LOW (ref 19–32)
Calcium: 9.3 mg/dL (ref 8.4–10.5)
Chloride: 105 mEq/L (ref 96–112)
Creatinine, Ser: 0.68 mg/dL (ref 0.50–1.10)
GFR calc Af Amer: >90 mL/min (ref >90)
GFR, EST NON AFRICAN AMERICAN: 79 mL/min — AB (ref >90)
Glucose, Bld: 165 mg/dL — ABNORMAL HIGH (ref 70–99)
Potassium: 4.8 mEq/L (ref 3.7–5.3)
Sodium: 139 mEq/L (ref 137–147)

## 2014-03-03 LAB — CREATININE, SERUM
Creatinine, Ser: 0.94 mg/dL (ref 0.50–1.10)
GFR calc Af Amer: 64 mL/min — ABNORMAL LOW (ref >90)
GFR calc non Af Amer: 55 mL/min — ABNORMAL LOW (ref >90)

## 2014-03-03 LAB — MAGNESIUM
Magnesium: 2.2 mg/dL (ref 1.5–2.5)
Magnesium: 2.2 mg/dL (ref 1.5–2.5)

## 2014-03-03 LAB — PREPARE RBC (CROSSMATCH)

## 2014-03-03 MED ORDER — FUROSEMIDE 10 MG/ML IJ SOLN
40.0000 mg | Freq: Once | INTRAMUSCULAR | Status: AC
  Administered 2014-03-03: 40 mg via INTRAVENOUS

## 2014-03-03 MED ORDER — INSULIN DETEMIR 100 UNIT/ML ~~LOC~~ SOLN
20.0000 [IU] | Freq: Every day | SUBCUTANEOUS | Status: DC
  Administered 2014-03-04: 20 [IU] via SUBCUTANEOUS
  Filled 2014-03-03 (×2): qty 0.2

## 2014-03-03 MED ORDER — INSULIN DETEMIR 100 UNIT/ML ~~LOC~~ SOLN
20.0000 [IU] | Freq: Once | SUBCUTANEOUS | Status: AC
  Administered 2014-03-03: 20 [IU] via SUBCUTANEOUS
  Filled 2014-03-03: qty 0.2

## 2014-03-03 MED ORDER — INSULIN ASPART 100 UNIT/ML ~~LOC~~ SOLN
0.0000 [IU] | SUBCUTANEOUS | Status: DC
  Administered 2014-03-03: 2 [IU] via SUBCUTANEOUS
  Administered 2014-03-03: 4 [IU] via SUBCUTANEOUS
  Administered 2014-03-03: 2 [IU] via SUBCUTANEOUS

## 2014-03-03 MED ORDER — INSULIN ASPART 100 UNIT/ML ~~LOC~~ SOLN: 0.0000 [IU] | SUBCUTANEOUS | Status: DC

## 2014-03-03 MED ORDER — SODIUM CHLORIDE 0.9 % IV SOLN: Freq: Once | INTRAVENOUS | Status: DC

## 2014-03-03 MED FILL — Potassium Chloride Inj 2 mEq/ML: INTRAVENOUS | Qty: 40 | Status: AC

## 2014-03-03 MED FILL — Magnesium Sulfate Inj 50%: INTRAMUSCULAR | Qty: 10 | Status: AC

## 2014-03-03 MED FILL — Heparin Sodium (Porcine) Inj 1000 Unit/ML: INTRAMUSCULAR | Qty: 30 | Status: AC

## 2014-03-03 NOTE — Progress Notes (Signed)
TCTS BRIEF SICU PROGRESS NOTE  1 Day Post-Op  S/P Procedure(s) (LRB): AORTIC VALVE REPLACEMENT (AVR) (N/A)   Stable day NSR w/ stable BP O2 sats 98% on 2 L/min UOP adequate  Plan: Continue current plan  OWEN,CLARENCE H 03/03/2014 7:07 PM

## 2014-03-03 NOTE — Progress Notes (Addendum)
TCTS DAILY ICU PROGRESS NOTE                   [address]            [place] [id number]          [phone]   1 Day Post-Op Procedure(s) (LRB): AORTIC VALVE REPLACEMENT (AVR) (N/A)  Total Length of Stay:  LOS: 1 day   Subjective: Awake, alert, extubated.  Had some nausea overnight, but feeling better this am.  No complaints at present.   Objective: Vital signs in last 24 hours: Temp:  [94.6 F (34.8 C)-99.7 F (37.6 C)] 99.7 F (37.6 C) (08/18 0745) Pulse Rate:  [80-102] 95 (08/18 0745) Cardiac Rhythm:  [-] Normal sinus rhythm (08/18 0745) Resp:  [7-29] 20 (08/18 0730) BP: (78-137)/(39-91) 95/47 mmHg (08/18 0745) SpO2:  [92 %-100 %] 98 % (08/18 0745) Arterial Line BP: (66-159)/(30-61) 133/41 mmHg (08/18 0745) FiO2 (%):  [40 %-50 %] 40 % (08/17 1657) Weight:  [167 lb 8.8 oz (76 kg)] 167 lb 8.8 oz (76 kg) (08/18 0500)  Filed Weights   03/01/14 1300 03/02/14 0549 03/03/14 0500  Weight: 145 lb 11.6 oz (66.1 kg) 145 lb (65.772 kg) 167 lb 8.8 oz (76 kg)    Weight change: 21 lb 13.2 oz (9.9 kg)   Hemodynamic parameters for last 24 hours: PAP: (20-40)/(10-26) 32/20 mmHg CO:  [2.4 L/min-5.1 L/min] 5.1 L/min CI:  [1.5 L/min/m2-3.1 L/min/m2] 3.1 L/min/m2  Intake/Output from previous day: 08/17 0701 - 08/18 0700 In: 8007.2 [I.V.:5169.2; Blood:438; IV Piggyback:2400] Out: 3216 [Urine:1855; Emesis/NG output:150; Blood:800; Chest Tube:411]  CBGs 136-132-128-136-153-110-113-90-158     Current Meds: Scheduled Meds: . acetaminophen  1,000 mg Oral 4 times per day   Or  . acetaminophen (TYLENOL) oral liquid 160 mg/5 mL  1,000 mg Per Tube 4 times per day  . aspirin EC  325 mg Oral Daily   Or  . aspirin  324 mg Per Tube Daily  . bisacodyl  10 mg Oral Daily   Or  . bisacodyl  10 mg Rectal Daily  . calcium-vitamin D  1 tablet Oral BID  . cefUROXime (ZINACEF)  IV  1.5 g Intravenous Q12H  . docusate sodium  200 mg Oral Daily  . famotidine (PEPCID) IV  20 mg  Intravenous Q12H  . insulin aspart  0-24 Units Subcutaneous 6 times per day  . metoCLOPramide (REGLAN) injection  10 mg Intravenous 4 times per day  . metoprolol tartrate  12.5 mg Oral BID   Or  . metoprolol tartrate  12.5 mg Per Tube BID  . multivitamin with minerals  1 tablet Oral QPM  . niacin  500 mg Oral BID  . [START ON 03/04/2014] pantoprazole  40 mg Oral Daily  . polyvinyl alcohol  1 drop Both Eyes BID  . simvastatin  20 mg Oral q1800  . sodium chloride  3 mL Intravenous Q12H  . vitamin E  400 Units Oral QPM  . zolpidem  5 mg Oral QHS   Continuous Infusions: . sodium chloride 20 mL/hr (03/02/14 1215)  . sodium chloride 10 mL (03/02/14 1215)  . sodium chloride    . dexmedetomidine Stopped (03/02/14 1530)  . lactated ringers 20 mL/hr at 03/02/14 2000  . nitroGLYCERIN Stopped (03/02/14 1215)  . norepinephrine (LEVOPHED) Adult infusion 10 mcg/min (03/03/14 0700)  . phenylephrine (NEO-SYNEPHRINE) Adult infusion 30 mcg/min (03/03/14 0700)   PRN Meds:.metoprolol, midazolam, morphine injection, ondansetron (ZOFRAN) IV, oxyCODONE, sodium chloride  Physical Exam: General appearance: alert, cooperative and no distress Heart: regular rate and rhythm Lungs: diminished breath sounds bilaterally Extremities: extremities normal, atraumatic, no cyanosis or edema Wound: Dressed and dryu  Lab Results: CBC: Recent Labs  03/02/14 1800 03/02/14 1812 03/03/14 0330  WBC 12.2*  --  13.4*  HGB 9.3* 8.2* 7.6*  HCT 26.9* 24.0* 22.3*  PLT 90*  --  66*   BMET:  Recent Labs  03/02/14 1812 03/03/14 0330  NA 138 139  K 4.6 4.8  CL 109 105  CO2  --  17*  GLUCOSE 136* 165*  BUN 7 10  CREATININE 0.50 0.68  CALCIUM  --  9.3    PT/INR:  Recent Labs  03/02/14 1232  LABPROT 22.1*  INR 1.93*   Radiology: Dg Chest Portable 1 View  03/02/2014   CLINICAL DATA:  Status post aortic valve replacement  EXAM: PORTABLE CHEST - 1 VIEW  COMPARISON:  February 26, 2014  FINDINGS: Endotracheal tube  tip is 3.4 cm above the carina. Swan-Ganz catheter tip is in the distal right main pulmonary outflow tract nasogastric tube tip and side port are in the stomach. There is a left chest tube and a mediastinal drain. No pneumothorax. There is focal airspace consolidation in the right lower lobe. There is atelectatic change in the left base. Heart is upper normal in size with pulmonary vascularity within normal limits. Patient is status post aortic valve replacement.  IMPRESSION: Tube and catheter positions as described without pneumothorax. Right lower lobe airspace consolidation. Patchy atelectasis left base.   Electronically Signed   By: Bretta Bang M.D.   On: 03/02/2014 13:20     Assessment/Plan: S/P Procedure(s) (LRB): AORTIC VALVE REPLACEMENT (AVR) (N/A)  CV- hypotensive overnight requiring Levophed, Neo gtts.  Maintaining SR.  Wean gtts as tolerated and watch BPs.  Expected postop blood loss anemia- Tx 1 unit PRBCs this am.  Postop thrombocytopenia- watch plts.  Mobilize, d/c CTs, routine POD#1 progression.  COLLINS,GINA H 03/03/2014 7:58 AM  Patient seen and examined, agree with above Transfuse Wean levophed She is thrombocytopenic so will use SCD but not enoxaparin for DVT prophylaxis

## 2014-03-04 ENCOUNTER — Inpatient Hospital Stay (HOSPITAL_COMMUNITY): Payer: Medicare Other

## 2014-03-04 ENCOUNTER — Encounter (HOSPITAL_COMMUNITY): Payer: Self-pay | Admitting: Thoracic Surgery (Cardiothoracic Vascular Surgery)

## 2014-03-04 LAB — BASIC METABOLIC PANEL
Anion gap: 8 (ref 5–15)
BUN: 17 mg/dL (ref 6–23)
CHLORIDE: 105 meq/L (ref 96–112)
CO2: 25 mEq/L (ref 19–32)
Calcium: 9.3 mg/dL (ref 8.4–10.5)
Creatinine, Ser: 0.64 mg/dL (ref 0.50–1.10)
GFR calc non Af Amer: 81 mL/min — ABNORMAL LOW (ref >90)
Glucose, Bld: 105 mg/dL — ABNORMAL HIGH (ref 70–99)
Potassium: 4.9 mEq/L (ref 3.7–5.3)
Sodium: 138 mEq/L (ref 137–147)

## 2014-03-04 LAB — GLUCOSE, CAPILLARY
GLUCOSE-CAPILLARY: 136 mg/dL — AB (ref 70–99)
Glucose-Capillary: 112 mg/dL — ABNORMAL HIGH (ref 70–99)
Glucose-Capillary: 106 mg/dL — ABNORMAL HIGH (ref 70–99)
Glucose-Capillary: 108 mg/dL — ABNORMAL HIGH (ref 70–99)

## 2014-03-04 LAB — CBC
HEMATOCRIT: 25.9 % — AB (ref 36.0–46.0)
HEMOGLOBIN: 8.9 g/dL — AB (ref 12.0–15.0)
MCH: 30.3 pg (ref 26.0–34.0)
MCHC: 34.4 g/dL (ref 30.0–36.0)
MCV: 88.1 fL (ref 78.0–100.0)
Platelets: 48 10*3/uL — ABNORMAL LOW (ref 150–400)
RBC: 2.94 MIL/uL — ABNORMAL LOW (ref 3.87–5.11)
RDW: 15.8 % — ABNORMAL HIGH (ref 11.5–15.5)
WBC: 11.9 10*3/uL — ABNORMAL HIGH (ref 4.0–10.5)

## 2014-03-04 MED ORDER — FUROSEMIDE 10 MG/ML IJ SOLN
40.0000 mg | Freq: Two times a day (BID) | INTRAMUSCULAR | Status: DC
  Administered 2014-03-04 – 2014-03-05 (×4): 40 mg via INTRAVENOUS
  Filled 2014-03-04 (×8): qty 4

## 2014-03-04 MED ORDER — LEVALBUTEROL HCL 0.63 MG/3ML IN NEBU
0.6300 mg | INHALATION_SOLUTION | Freq: Three times a day (TID) | RESPIRATORY_TRACT | Status: DC
  Administered 2014-03-04 – 2014-03-10 (×17): 0.63 mg via RESPIRATORY_TRACT
  Filled 2014-03-04 (×34): qty 3

## 2014-03-04 MED ORDER — LEVALBUTEROL HCL 0.63 MG/3ML IN NEBU: 0.6300 mg | INHALATION_SOLUTION | Freq: Four times a day (QID) | RESPIRATORY_TRACT | Status: DC | PRN

## 2014-03-04 MED ORDER — LEVALBUTEROL HCL 0.63 MG/3ML IN NEBU
INHALATION_SOLUTION | RESPIRATORY_TRACT | Status: AC
  Filled 2014-03-04: qty 3

## 2014-03-04 MED ORDER — INSULIN ASPART 100 UNIT/ML ~~LOC~~ SOLN
0.0000 [IU] | Freq: Three times a day (TID) | SUBCUTANEOUS | Status: DC
  Administered 2014-03-04 – 2014-03-05 (×3): 2 [IU] via SUBCUTANEOUS
  Administered 2014-03-06: 18:00:00 via SUBCUTANEOUS

## 2014-03-04 NOTE — Progress Notes (Signed)
Patient ID: Sara Donaldson, female   DOB: [birth year], 80 y.o.   MRN: [id number] EVENING ROUNDS NOTE :     [address]       [place] [id number]             [phone]                 2 Days Post-Op Procedure(s) (LRB): AORTIC VALVE REPLACEMENT (AVR) (N/A)  Total Length of Stay:  LOS: 2 days  BP 146/67  Pulse 111  Temp(Src) 98.2 F (36.8 C) (Axillary)  Resp 21  Ht 5\' 1"  (1.549 m)  Wt 169 lb (76.658 kg)  BMI 31.95 kg/m2  SpO2 100%  .Intake/Output     08/18 0701 - 08/19 0700 08/19 0701 - 08/20 0700   P.O.  560   I.V. (mL/kg) 543.2 (7.1) 90 (1.2)   Blood 362.5    IV Piggyback 50    Total Intake(mL/kg) 955.7 (12.5) 650 (8.5)   Urine (mL/kg/hr) 855 (0.5) 950 (1.1)   Emesis/NG output     Blood     Chest Tube 110 (0.1)    Total Output 965 950   Net -9.3 -300          . sodium chloride 20 mL/hr (03/02/14 1215)  . sodium chloride 10 mL (03/02/14 1215)  . sodium chloride    . dexmedetomidine Stopped (03/02/14 1530)  . lactated ringers 20 mL/hr at 03/02/14 2000  . nitroGLYCERIN Stopped (03/02/14 1215)  . norepinephrine (LEVOPHED) Adult infusion Stopped (03/03/14 1200)  . phenylephrine (NEO-SYNEPHRINE) Adult infusion Stopped (03/03/14 0745)     Lab Results  Component Value Date   WBC 11.9* 03/04/2014   HGB 8.9* 03/04/2014   HCT 25.9* 03/04/2014   PLT 48* 03/04/2014   GLUCOSE 105* 03/04/2014   ALT 18 02/26/2014   AST 23 02/26/2014   NA 138 03/04/2014   K 4.9 03/04/2014   CL 105 03/04/2014   CREATININE 0.64 03/04/2014   BUN 17 03/04/2014   CO2 25 03/04/2014   INR 1.93* 03/02/2014   HGBA1C 5.3 02/26/2014   Not wheezing tonight Up in chair Getting dose of lasix now  Delight OvensEdward B Yoltzin Ransom MD  Beeper [phone] Office [phone] 03/04/2014 5:51 PM

## 2014-03-04 NOTE — Progress Notes (Signed)
  Echocardiogram 2D Echocardiogram has been performed.  Arvil ChacoFoster, Auda Finfrock 03/04/2014, 11:33 AM

## 2014-03-05 ENCOUNTER — Encounter (HOSPITAL_COMMUNITY): Payer: Self-pay | Admitting: Oncology

## 2014-03-05 ENCOUNTER — Inpatient Hospital Stay (HOSPITAL_COMMUNITY): Payer: Medicare Other

## 2014-03-05 DIAGNOSIS — Z954 Presence of other heart-valve replacement: Secondary | ICD-10-CM

## 2014-03-05 DIAGNOSIS — D696 Thrombocytopenia, unspecified: Secondary | ICD-10-CM

## 2014-03-05 HISTORY — DX: Thrombocytopenia, unspecified: D69.6

## 2014-03-05 LAB — CBC
HEMATOCRIT: 24.7 % — AB (ref 36.0–46.0)
HEMOGLOBIN: 8.4 g/dL — AB (ref 12.0–15.0)
MCH: 30.3 pg (ref 26.0–34.0)
MCHC: 34 g/dL (ref 30.0–36.0)
MCV: 89.2 fL (ref 78.0–100.0)
Platelets: 38 10*3/uL — ABNORMAL LOW (ref 150–400)
RBC: 2.77 MIL/uL — AB (ref 3.87–5.11)
RDW: 15.5 % (ref 11.5–15.5)
WBC: 8.5 10*3/uL (ref 4.0–10.5)

## 2014-03-05 LAB — CBC WITH DIFFERENTIAL/PLATELET
BASOS PCT: 0 % (ref 0–1)
Basophils Absolute: 0 10*3/uL (ref 0.0–0.1)
Eosinophils Absolute: 0.2 10*3/uL (ref 0.0–0.7)
Eosinophils Relative: 2 % (ref 0–5)
HCT: 25.7 % — ABNORMAL LOW (ref 36.0–46.0)
Hemoglobin: 8.6 g/dL — ABNORMAL LOW (ref 12.0–15.0)
LYMPHS PCT: 16 % (ref 12–46)
Lymphs Abs: 1.5 10*3/uL (ref 0.7–4.0)
MCH: 30 pg (ref 26.0–34.0)
MCHC: 33.5 g/dL (ref 30.0–36.0)
MCV: 89.5 fL (ref 78.0–100.0)
MONOS PCT: 11 % (ref 3–12)
Monocytes Absolute: 1.1 10*3/uL — ABNORMAL HIGH (ref 0.1–1.0)
NEUTROS ABS: 7.1 10*3/uL (ref 1.7–7.7)
NEUTROS PCT: 72 % (ref 43–77)
Platelets: 49 10*3/uL — ABNORMAL LOW (ref 150–400)
RBC: 2.87 MIL/uL — AB (ref 3.87–5.11)
RDW: 15.2 % (ref 11.5–15.5)
WBC: 9.9 10*3/uL (ref 4.0–10.5)

## 2014-03-05 LAB — PROTIME-INR
INR: 1.16 (ref 0.00–1.49)
Prothrombin Time: 14.8 s (ref 11.6–15.2)

## 2014-03-05 LAB — BASIC METABOLIC PANEL
Anion gap: 6 (ref 5–15)
BUN: 16 mg/dL (ref 6–23)
CO2: 31 meq/L (ref 19–32)
Calcium: 9 mg/dL (ref 8.4–10.5)
Chloride: 103 mEq/L (ref 96–112)
Creatinine, Ser: 0.52 mg/dL (ref 0.50–1.10)
GFR calc Af Amer: >90 mL/min (ref >90)
GFR calc non Af Amer: 87 mL/min — ABNORMAL LOW (ref >90)
Glucose, Bld: 92 mg/dL (ref 70–99)
POTASSIUM: 3.8 meq/L (ref 3.7–5.3)
Sodium: 140 mEq/L (ref 137–147)

## 2014-03-05 LAB — GLUCOSE, CAPILLARY
GLUCOSE-CAPILLARY: 120 mg/dL — AB (ref 70–99)
GLUCOSE-CAPILLARY: 112 mg/dL — AB (ref 70–99)
Glucose-Capillary: 134 mg/dL — ABNORMAL HIGH (ref 70–99)
Glucose-Capillary: 104 mg/dL — ABNORMAL HIGH (ref 70–99)
Glucose-Capillary: 129 mg/dL — ABNORMAL HIGH (ref 70–99)

## 2014-03-05 LAB — FIBRINOGEN: FIBRINOGEN: 451 mg/dL (ref 204–475)

## 2014-03-05 LAB — APTT: aPTT: 32 s (ref 24–37)

## 2014-03-05 MED ORDER — BIVALIRUDIN 250 MG IV SOLR
0.0500 mg/kg/h | INTRAVENOUS | Status: DC
  Administered 2014-03-05: 0.05 mg/kg/h via INTRAVENOUS
  Filled 2014-03-05: qty 250

## 2014-03-05 MED ORDER — ASPIRIN EC 81 MG PO TBEC
81.0000 mg | DELAYED_RELEASE_TABLET | Freq: Every day | ORAL | Status: DC
  Administered 2014-03-05 – 2014-03-10 (×6): 81 mg via ORAL
  Filled 2014-03-05 (×6): qty 1

## 2014-03-05 MED ORDER — METOPROLOL TARTRATE 25 MG PO TABS
25.0000 mg | ORAL_TABLET | Freq: Two times a day (BID) | ORAL | Status: DC
  Administered 2014-03-05 – 2014-03-10 (×11): 25 mg via ORAL
  Filled 2014-03-05 (×12): qty 1

## 2014-03-05 MED ORDER — METOPROLOL TARTRATE 25 MG/10 ML ORAL SUSPENSION
12.5000 mg | Freq: Two times a day (BID) | ORAL | Status: DC
  Filled 2014-03-05 (×4): qty 5

## 2014-03-05 MED ORDER — INSULIN DETEMIR 100 UNIT/ML ~~LOC~~ SOLN
15.0000 [IU] | Freq: Every day | SUBCUTANEOUS | Status: DC
  Administered 2014-03-05: 15 [IU] via SUBCUTANEOUS
  Filled 2014-03-05 (×2): qty 0.15

## 2014-03-05 NOTE — Progress Notes (Signed)
CT surgery p.m. Rounds  Patient status post aVR with dropping platelet count Appreciate hematology consultation IV bivalirudin started while HIT assay in progress Patient clinically stable with good perfusion stable vital signs in sinus rhythm P.m. labs reviewed-hemoglobin 8.6 platelet count 49,000 Patient resting comfortably

## 2014-03-05 NOTE — Progress Notes (Addendum)
TCTS DAILY ICU PROGRESS NOTE                   [address]            [place] [id number]          [phone]   3 Days Post-Op Procedure(s) (LRB): AORTIC VALVE REPLACEMENT (AVR) (N/A)  Total Length of Stay:  LOS: 3 days   Subjective: OOB in chair.  Feeling better overall.  Still wheezing.    Objective: Vital signs in last 24 hours: Temp:  [98.1 F (36.7 C)-98.5 F (36.9 C)] 98.3 F (36.8 C) (08/20 0400) Pulse Rate:  [91-120] 111 (08/20 0704) Cardiac Rhythm:  [-] Normal sinus rhythm (08/20 0400) Resp:  [11-26] 18 (08/20 0704) BP: (109-162)/(59-108) 141/72 mmHg (08/20 0704) SpO2:  [89 %-100 %] 98 % (08/20 0704) Weight:  [162 lb 4.1 oz (73.6 kg)] 162 lb 4.1 oz (73.6 kg) (08/20 0500)  Filed Weights   03/03/14 0500 03/04/14 0500 03/05/14 0500  Weight: 167 lb 8.8 oz (76 kg) 169 lb (76.658 kg) 162 lb 4.1 oz (73.6 kg)    Weight change: -6 lb 11.9 oz (-3.058 kg)   CBGs 136-120-92     Intake/Output from previous day: 08/19 0701 - 08/20 0700 In: 920 [P.O.:680; I.V.:240] Out: 2470 [Urine:2470]  Intake/Output this shift:    Current Meds: Scheduled Meds: . sodium chloride   Intravenous Once  . acetaminophen  1,000 mg Oral 4 times per day   Or  . acetaminophen (TYLENOL) oral liquid 160 mg/5 mL  1,000 mg Per Tube 4 times per day  . aspirin EC  325 mg Oral Daily   Or  . aspirin  324 mg Per Tube Daily  . bisacodyl  10 mg Oral Daily   Or  . bisacodyl  10 mg Rectal Daily  . calcium-vitamin D  1 tablet Oral BID  . docusate sodium  200 mg Oral Daily  . furosemide  40 mg Intravenous BID  . insulin aspart  0-15 Units Subcutaneous TID WC  . insulin detemir  20 Units Subcutaneous Daily  . levalbuterol  0.63 mg Nebulization TID  . metoprolol tartrate  12.5 mg Oral BID   Or  . metoprolol tartrate  12.5 mg Per Tube BID  . multivitamin with minerals  1 tablet Oral QPM  . niacin  500 mg Oral BID  . pantoprazole  40 mg Oral Daily  . polyvinyl alcohol  1 drop  Both Eyes BID  . simvastatin  20 mg Oral q1800  . sodium chloride  3 mL Intravenous Q12H  . vitamin E  400 Units Oral QPM  . zolpidem  5 mg Oral QHS   Continuous Infusions: . sodium chloride 20 mL/hr (03/02/14 1215)  . sodium chloride 10 mL (03/02/14 1215)  . sodium chloride    . dexmedetomidine Stopped (03/02/14 1530)  . lactated ringers 20 mL/hr at 03/02/14 2000  . nitroGLYCERIN Stopped (03/02/14 1215)  . norepinephrine (LEVOPHED) Adult infusion Stopped (03/03/14 1200)  . phenylephrine (NEO-SYNEPHRINE) Adult infusion Stopped (03/03/14 0745)   PRN Meds:.levalbuterol, metoprolol, midazolam, morphine injection, ondansetron (ZOFRAN) IV, oxyCODONE, sodium chloride   Physical Exam: General appearance: alert, cooperative and no distress Heart: RRR, slightly tachy 100-110s Lungs: Expiratory wheezes bilaterally Extremities: No significant LE edema, L upper arm with widespread ecchymosis, area soft Wound: Clean and dry     Lab Results: CBC: Recent Labs  03/04/14 0400 03/05/14 0430  WBC 11.9* 8.5  HGB 8.9* 8.4*  HCT 25.9* 24.7*  PLT 48* 38*   BMET:  Recent Labs  03/04/14 0400 03/05/14 0430  NA 138 140  K 4.9 3.8  CL 105 103  CO2 25 31  GLUCOSE 105* 92  BUN 17 16  CREATININE 0.64 0.52  CALCIUM 9.3 9.0    PT/INR:  Recent Labs  03/02/14 1232  LABPROT 22.1*  INR 1.93*   Radiology: Dg Chest Port 1 View  03/04/2014   CLINICAL DATA:  Postop aortic valve replacement  EXAM: PORTABLE CHEST - 1 VIEW  COMPARISON:  Portable chest x-ray of 03/03/2014  FINDINGS: The Swan-Ganz catheter has been removed with a venous sheath remaining in the SVC. Cardiomegaly is stable. No pneumothorax is seen. Minimal bibasilar atelectasis is noted.  IMPRESSION: Swan-Ganz catheter removed.  Minimal bibasilar atelectasis.   Electronically Signed   By: Dwyane Dee M.D.   On: 03/04/2014 07:55   2D echo (8/19): Study Conclusions  - Left ventricle: The cavity size was normal. Systolic function  was normal. Wall motion was normal; there were no regional wall motion abnormalities. Doppler parameters are consistent with abnormal left ventricular relaxation (grade 1 diastolic dysfunction). - Aortic valve: A bioprosthesis was present. Transvalvular velocity was minimally increased. There was very mild stenosis. There was trivial regurgitation. - Mitral valve: Severely calcified annulus. Mildly thickened leaflets . - Tricuspid valve: There was moderate regurgitation. - Pericardium, extracardiac: A trivial pericardial effusion was identified.    Assessment/Plan: S/P Procedure(s) (LRB): AORTIC VALVE REPLACEMENT (AVR) (N/A)  CV- Mildly tachy, BPs trending up. Will increase beta blocker, watch carefully due to wheezing.   Pulm- continue pulm toilet, nebs.  Thrombocytopenia- plts continue to drop, not receiving any Heparin or Lovenox.  Will check HIT panel and watch.  Vol overload- continue diuresis.  Expected postop blood loss anemia- H/H generally stable after transfusion.  Will monitor.  Endocrine- CBGs stable, no h/o DM (A1C=5.3).  Decrease Levemir and watch.  Mobilize as tolerated.   Possibly could d/c Foley and transfer to stepdown soon if pulm status remains stable.   Sara Donaldson 03/05/2014 7:38 AM Patient seen and examined, agree with above Thrombocytopenia is a bit mysterious- will check HIT panel, no clinical evidence of HITT  Change ASA to 81 mg, will plan not to use coumadin unless she has a fib Wheezing is much better but will still benefit from additional diuresis

## 2014-03-05 NOTE — Consult Note (Signed)
Referring MD: Dr. Andrey SpearmanSteve Hendrickson  PCP:  Arlyss QueenONROY,NATHAN, PA-C   Reason for Referral: Thrombocytopenia      HPI:  80 year old woman who has been in overall excellent health without any major medical or surgical illness. She has treated hypertension and hyperlipidemia. She has aortic stenosis which has become increasingly symptomatic She underwent cardiac catheterization on 10/24/2013 which showed a  31 mm gradient between the left ventricle and the aorta. Minimal coronary stenosis 20-30% left circumflex and 20% proximal right coronary. Ejection fraction estimated at 70%. Moderate aortic stenosis by that study. Medical therapy initially recommended. However due to progressive symptoms, with dyspnea on exertion and presyncope. An echocardiogram was done  On 01/12/2014 showed moderate concentric hypertrophy with normal systolic function. There was moderate to severe aortic valve regurgitation with a trileaflet aortic valve with severely calcified leaflets and severe reduction in cusp separation. Increased transvalvular velocity. Mean gradient 33 mm.Surgical consultation was obtained and she was felt to be a candidate for valve replacement. She underwent aortic valve replacement on August 17 with a tissue valve.  Preoperative platelet count was normal at 188,000 on 02/26/2014. Baseline coagulation studies normal with protime 12.9 seconds and PTT 22 seconds. Normal liver functions. Platelet count showed the usual transient fall on the day of surgery down to 90,000 but by the next day her platelets were 66,000. Platelet count has drifted down to today's value of 38,000. Repeat coagulation studies done on August 17 showed elevation of PT at 22 seconds and PTT at 53 seconds.  She received the usual perioperative antibiotics with a dose of Zaino Zinacef and vancomycin. She received infusions of heparin, Demadex, dopamine, and norepinephrine intraoperatively . Current medications include Xopenex, Lopressor,  Protonix, Zocor, niacin, when necessary Ambien, oxycodone, morphine when necessary, Levemir. Aspirin 81 mg.  Clinically she is doing well otherwise. She is alert, oriented, extubated, and hemodynamically stable. No fevers.  There is no prior history of any personal or family bleeding problems. No history of hepatitis or jaundice. No liver problems. No kidney problems.  Past Medical History  Diagnosis Date  . Hypertension   . Aortic stenosis   . Hypercholesterolemia   . Heart murmur   . Bronchitis   . Thrombocytopenia 03/05/2014  :  Past Surgical History  Procedure Laterality Date  . Tee without cardioversion N/A 01/12/2014    Procedure: TRANSESOPHAGEAL ECHOCARDIOGRAM (TEE);  Surgeon: Ricki RodriguezAjay S Kadakia, MD;  [facility];  Service: Cardiovascular;  Laterality: N/A;  . Eye surgery    . Cardiac catheterization      4/10 2015  . Aortic valve replacement N/A 03/02/2014    Procedure: AORTIC VALVE REPLACEMENT (AVR);  Surgeon: Loreli SlotSteven C Hendrickson, MD;  [facility];  Service: Open Heart Surgery;  Laterality: N/A;  :  . sodium chloride   Intravenous Once  . acetaminophen  1,000 mg Oral 4 times per day   Or  . acetaminophen (TYLENOL) oral liquid 160 mg/5 mL  1,000 mg Per Tube 4 times per day  . aspirin EC  81 mg Oral Daily  . bisacodyl  10 mg Oral Daily   Or  . bisacodyl  10 mg Rectal Daily  . calcium-vitamin D  1 tablet Oral BID  . docusate sodium  200 mg Oral Daily  . furosemide  40 mg Intravenous BID  . insulin aspart  0-15 Units Subcutaneous TID WC  . insulin detemir  15 Units Subcutaneous Daily  . levalbuterol  0.63 mg Nebulization TID  . metoprolol tartrate  25  mg Oral BID   Or  . metoprolol tartrate  12.5 mg Per Tube BID  . multivitamin with minerals  1 tablet Oral QPM  . niacin  500 mg Oral BID  . pantoprazole  40 mg Oral Daily  . polyvinyl alcohol  1 drop Both Eyes BID  . simvastatin  20 mg Oral q1800  . sodium chloride  3 mL Intravenous Q12H  . vitamin E  400  Units Oral QPM  . zolpidem  5 mg Oral QHS  :  Allergies  Allergen Reactions  . Tape Other (See Comments)    Adhesive tape causes blisters; use paper tape only  . Trazodone And Nefazodone Hives  :  Family History  Problem Relation Age of Onset  . Heart disease Mother   . Heart disease Father   . Hypertension Other   :  History   Social History  . Marital Status: Widowed    Spouse Name: N/A    Number of Children: N/A  . Years of Education: N/A   Occupational History  . Not on file.   Social History Main Topics  . Smoking status: Never Smoker   . Smokeless tobacco: Never Used  . Alcohol Use: No  . Drug Use: No  . Sexual Activity: Not on file   Other Topics Concern  . Not on file   Social History Narrative  . No narrative on file  :  Vitals: Filed Vitals:   03/05/14 1400  BP: 155/79  Pulse: 99  Temp:   Resp: 19    PHYSICAL EXAM: General appearance: well-nourished Caucasian woman who is alert and cooperative  HEENT: ecchymosis in the posterior pharynx status post intubation. Right internal jugular line.  Lymph Nodes:no adenopathy in the neck or supraclavicular regions  Resp:  lungs clear to auscultation resonant to percussion  Cardio:  regular cardiac rhythm. No appreciable murmur gallop or rub. Surgical dressings of her median sternotomy wound are dry. Vascular: there is an absent left radial pulse. at site of recent arterial line which has been removed. 2+ left ulnar pulse. Both hands are cool symmetrically and acyanotic.  2+ right radial pulse absent ulnar pulse. Dorsalis pedis pulses 2+ symmetric  Breasts: GI:  abdomen is soft and nontender no mass no organomegaly GU: Foley catheter with clear yellow urine  Extremities: no edema, no cyanosis,   Neurologic:  cranial nerves grossly normal, pupils are irregular from previous right iridectomy. Both reactive. Motor strength is 5 over 5.  Skin: multiple large ecchymoses proximal left arm, left wrist, dorsum  right hand, on the fingertips at site of CBG testing and in the posterior pharynx where she was intubated.   Labs:   Recent Labs  03/04/14 0400 03/05/14 0430  WBC 11.9* 8.5  HGB 8.9* 8.4*  HCT 25.9* 24.7*  PLT 48* 38*    Recent Labs  03/04/14 0400 03/05/14 0430  NA 138 140  K 4.9 3.8  CL 105 103  CO2 25 31  GLUCOSE 105* 92  BUN 17 16  CREATININE 0.64 0.52  CALCIUM 9.3 9.0    Blood smear review:  normochromic normocytic red cells. No schistocytes. Platelets decreased in number but normal morphology. Mature neutrophils and lymphocytes with occasional benign reactive lymphocyte   Images Studies/Results:  Dg Chest Port 1 View  03/05/2014   CLINICAL DATA:  Status post aortic valve replacement.  EXAM: PORTABLE CHEST - 1 VIEW  COMPARISON:  March 04, 2014.  FINDINGS: Stable cardiomegaly. Sternotomy wires are noted. No  pneumothorax is noted. Right internal jugular venous sheath remains. Mild left basilar opacity is noted most consistent with subsegmental atelectasis. Minimal left pleural effusion may be present.  IMPRESSION: Mild left basilar opacity is noted concerning for subsegmental atelectasis. No pneumothorax is noted.   Electronically Signed   By: Roque Lias M.D.   On: 03/05/2014 08:10   Dg Chest Port 1 View  03/04/2014   CLINICAL DATA:  Postop aortic valve replacement  EXAM: PORTABLE CHEST - 1 VIEW  COMPARISON:  Portable chest x-ray of 03/03/2014  FINDINGS: The Swan-Ganz catheter has been removed with a venous sheath remaining in the SVC. Cardiomegaly is stable. No pneumothorax is seen. Minimal bibasilar atelectasis is noted.  IMPRESSION: Swan-Ganz catheter removed.  Minimal bibasilar atelectasis.   Electronically Signed   By: Dwyane Dee M.D.   On: 03/04/2014 07:55       Assessment: Active Problems:   S/P AVR   Thrombocytopenia   Impression: Transient platelet consumption due to activation on the membrane oxygenator versus heparin induced thrombocytopenia with  recent sensitization at time of cardiac cath in April versus other medication toxicity(both vancomycin and Zyvox  have been implicated in drug-induced thrombocytopenia.)  Recommendation: The patient has an intermediate pretest probability that this is heparin related and I feel  that we should cover her with bivalirudin or argatroban pending results of a heparin  thrombocytopenia test. Although the absence of the left ulnar pulse is likely related to the recent arterial catheter line, it is of concern.  I will repeat a coagulation profile and a fibrinogen to rule out low grade DIC.        Raiya Stainback M 03/05/2014, 2:20 PM

## 2014-03-06 ENCOUNTER — Inpatient Hospital Stay (HOSPITAL_COMMUNITY): Payer: Medicare Other

## 2014-03-06 LAB — CBC
HCT: 25 % — ABNORMAL LOW (ref 36.0–46.0)
Hemoglobin: 8.3 g/dL — ABNORMAL LOW (ref 12.0–15.0)
MCH: 30.2 pg (ref 26.0–34.0)
MCHC: 33.2 g/dL (ref 30.0–36.0)
MCV: 90.9 fL (ref 78.0–100.0)
PLATELETS: 60 10*3/uL — AB (ref 150–400)
RBC: 2.75 MIL/uL — ABNORMAL LOW (ref 3.87–5.11)
RDW: 15.3 % (ref 11.5–15.5)
WBC: 7.7 10*3/uL (ref 4.0–10.5)

## 2014-03-06 LAB — BASIC METABOLIC PANEL
Anion gap: 7 (ref 5–15)
BUN: 13 mg/dL (ref 6–23)
CO2: 33 mEq/L — ABNORMAL HIGH (ref 19–32)
Calcium: 8.6 mg/dL (ref 8.4–10.5)
Chloride: 104 mEq/L (ref 96–112)
Creatinine, Ser: 0.49 mg/dL — ABNORMAL LOW (ref 0.50–1.10)
GFR calc Af Amer: >90 mL/min (ref >90)
GFR calc non Af Amer: 88 mL/min — ABNORMAL LOW (ref >90)
Glucose, Bld: 81 mg/dL (ref 70–99)
Potassium: 3.3 mEq/L — ABNORMAL LOW (ref 3.7–5.3)
Sodium: 144 mEq/L (ref 137–147)

## 2014-03-06 LAB — GLUCOSE, CAPILLARY
Glucose-Capillary: 76 mg/dL (ref 70–99)
Glucose-Capillary: 97 mg/dL (ref 70–99)
Glucose-Capillary: 158 mg/dL — ABNORMAL HIGH (ref 70–99)
Glucose-Capillary: 109 mg/dL — ABNORMAL HIGH (ref 70–99)

## 2014-03-06 LAB — TYPE AND SCREEN
ABO/RH(D): O POS
Antibody Screen: NEGATIVE
UNIT DIVISION: 0

## 2014-03-06 LAB — APTT: aPTT: 59 seconds — ABNORMAL HIGH (ref 24–37)

## 2014-03-06 MED ORDER — POTASSIUM CHLORIDE CRYS ER 20 MEQ PO TBCR
20.0000 meq | EXTENDED_RELEASE_TABLET | Freq: Two times a day (BID) | ORAL | Status: DC
  Administered 2014-03-06 – 2014-03-10 (×8): 20 meq via ORAL
  Filled 2014-03-06 (×10): qty 1

## 2014-03-06 MED ORDER — HYDROCHLOROTHIAZIDE 12.5 MG PO CAPS
12.5000 mg | ORAL_CAPSULE | Freq: Every day | ORAL | Status: DC
  Administered 2014-03-06 – 2014-03-08 (×3): 12.5 mg via ORAL
  Filled 2014-03-06 (×3): qty 1

## 2014-03-06 MED ORDER — MAGNESIUM HYDROXIDE 400 MG/5ML PO SUSP: 30.0000 mL | Freq: Every day | ORAL | Status: DC | PRN

## 2014-03-06 MED ORDER — POTASSIUM CHLORIDE 10 MEQ/50ML IV SOLN
10.0000 meq | INTRAVENOUS | Status: AC
Start: 2014-03-06 — End: 2014-03-06
  Administered 2014-03-06 (×4): 10 meq via INTRAVENOUS
  Filled 2014-03-06 (×5): qty 50

## 2014-03-06 MED ORDER — SODIUM CHLORIDE 0.9 % IV SOLN: 250.0000 mL | INTRAVENOUS | Status: DC | PRN

## 2014-03-06 MED ORDER — TRAMADOL HCL 50 MG PO TABS: 50.0000 mg | ORAL_TABLET | Freq: Four times a day (QID) | ORAL | Status: DC | PRN

## 2014-03-06 MED ORDER — SODIUM CHLORIDE 0.9 % IJ SOLN
3.0000 mL | Freq: Two times a day (BID) | INTRAMUSCULAR | Status: DC
Start: 2014-03-06 — End: 2014-03-10
  Administered 2014-03-06 – 2014-03-08 (×4): 3 mL via INTRAVENOUS

## 2014-03-06 MED ORDER — HYDROCHLOROTHIAZIDE 25 MG PO TABS
12.5000 mg | ORAL_TABLET | Freq: Every day | ORAL | Status: DC
  Filled 2014-03-06: qty 0.5

## 2014-03-06 MED ORDER — SODIUM CHLORIDE 0.9 % IJ SOLN: 3.0000 mL | INTRAMUSCULAR | Status: DC | PRN

## 2014-03-06 MED ORDER — DOXAZOSIN MESYLATE 1 MG PO TABS
1.0000 mg | ORAL_TABLET | Freq: Every day | ORAL | Status: DC
  Administered 2014-03-06 – 2014-03-08 (×3): 1 mg via ORAL
  Filled 2014-03-06 (×4): qty 1

## 2014-03-06 MED ORDER — MOVING RIGHT ALONG BOOK
Freq: Once | Status: AC
  Administered 2014-03-06: 13:00:00
  Filled 2014-03-06: qty 1

## 2014-03-06 MED ORDER — FUROSEMIDE 40 MG PO TABS
40.0000 mg | ORAL_TABLET | Freq: Every day | ORAL | Status: DC
  Administered 2014-03-06 – 2014-03-10 (×5): 40 mg via ORAL
  Filled 2014-03-06 (×5): qty 1

## 2014-03-06 MED ORDER — ALUM & MAG HYDROXIDE-SIMETH 200-200-20 MG/5ML PO SUSP: 15.0000 mL | ORAL | Status: DC | PRN

## 2014-03-06 MED ORDER — GUAIFENESIN-DM 100-10 MG/5ML PO SYRP: 15.0000 mL | ORAL_SOLUTION | ORAL | Status: DC | PRN

## 2014-03-06 MED ORDER — LISINOPRIL 5 MG PO TABS
5.0000 mg | ORAL_TABLET | Freq: Every day | ORAL | Status: DC
  Administered 2014-03-06 – 2014-03-10 (×5): 5 mg via ORAL
  Filled 2014-03-06 (×5): qty 1

## 2014-03-06 NOTE — Progress Notes (Signed)
4 Days Post-Op Procedure(s) (LRB): AORTIC VALVE REPLACEMENT (AVR) (N/A) Subjective: I feel a little stronger today Appetite poor, food tastes bitter  Objective: Vital signs in last 24 hours: Temp:  [98 F (36.7 C)-99.1 F (37.3 C)] 98.2 F (36.8 C) (08/21 0356) Pulse Rate:  [81-110] 102 (08/21 0700) Cardiac Rhythm:  [-] Normal sinus rhythm (08/21 0400) Resp:  [12-26] 16 (08/21 0700) BP: (95-162)/(48-126) 158/58 mmHg (08/21 0700) SpO2:  [92 %-100 %] 97 % (08/21 0700) Weight:  [162 lb 0.6 oz (73.5 kg)] 162 lb 0.6 oz (73.5 kg) (08/21 0500)  Hemodynamic parameters for last 24 hours:    Intake/Output from previous day: 08/20 0701 - 08/21 0700 In: 1043.5 [P.O.:720; I.V.:323.5] Out: 2455 [Urine:2455] Intake/Output this shift:    General appearance: alert and no distress Neurologic: intact Heart: regular rate and rhythm Lungs: diminished breath sounds bibasilar Abdomen: normal findings: soft, non-tender  Lab Results:  Recent Labs  03/05/14 1700 03/06/14 0400  WBC 9.9 7.7  HGB 8.6* 8.3*  HCT 25.7* 25.0*  PLT 49* 60*   BMET:  Recent Labs  03/05/14 0430 03/06/14 0400  NA 140 144  K 3.8 3.3*  CL 103 104  CO2 31 33*  GLUCOSE 92 81  BUN 16 13  CREATININE 0.52 0.49*  CALCIUM 9.0 8.6    PT/INR:  Recent Labs  03/05/14 1700  LABPROT 14.8  INR 1.16   ABG    Component Value Date/Time   PHART 7.300* 03/02/2014 1900   HCO3 24.7* 03/02/2014 1900   TCO2 20 03/03/2014 1548   ACIDBASEDEF 2.0 03/02/2014 1900   O2SAT 97.0 03/02/2014 1900   CBG (last 3)   Recent Labs  03/05/14 1531 03/05/14 1849 03/05/14 2209  GLUCAP 104* 129* 76    Assessment/Plan: S/P Procedure(s) (LRB): AORTIC VALVE REPLACEMENT (AVR) (N/A) - CV- in SR, still hypertensive, resume cardura and HCTZ, add lisinopril  RESP- continue IS, nebs  RENAL- hypokalemia- supplement, creatinine normal, continue diuresis  ENDO- CBG OK- dc levemir, continue SSI  Thrombocytopenia- ?HIT- panel  pending  PLT count up slightly today  Continue bivalrudin, PTT is 59 which is right where we want it  Deconditioning- continue mobilization   LOS: 4 days    Lexie Morini C 03/06/2014

## 2014-03-06 NOTE — Progress Notes (Signed)
Patient transferred to 2W Rm. 22. Patient ambulated for transfer without problems. New nurse at bedside.

## 2014-03-06 NOTE — Progress Notes (Signed)
Patient ID: Sara Donaldson, female   DOB: 04-Jan-1930, 80 y.o.   MRN: [id number] Bivalirudin started yesterday in view of intermediate-high probability that her thrombocytopenia is related to heparin.  She remains stable clinically. Platelet count is improving up from 38,000 to 60,000 this morning. Hemoglobin stable at 8.3. Screening assay for HAT pending. After recent discussions with the laboratory manager, sample should be sent to [facility] with a 24 hour turnaround for the platelet factor-4 ELISA test which will then reflex to a serotonin release assay with a three-day turnaround time. If the screening test is negative, it has a high negative predictive value and we can stop the bivalirudin. If the screening test is positive, we need to continue the bivalirudin pending the confirmatory test. If the confirmatory test is positive, the patient needs to stay on bivalirudin until her platelet count is over 150,000. At that point she should be put on low-dose warfarin 2.5 mg, continued on the bivalirudin until Coumadin therapeutic making dose adjustments every 3-4 days on the Coumadin. It is important not to start full dose Coumadin in cases of confirmed heparin associated thrombocytopenia secondary to the risk for warfarin related venous thrombosis. (Nice review of this subject [place] [employer] of Medicine last week by Dr. Gaye PollackWarkentin in [place] who is an Database administratorinternational expert). If heparin associated thrombocytopenia is diagnosed, we typically extend anticoagulation for one month since the risk of thrombosis is up to 50% in the first month after diagnosis in patients not adequately anticoagulated.  Above discussed with Dr. Dorris FetchHendrickson. I will be on vacation, my partners Dr. Bertis RuddyGorsuch and Dr. Truett PernaSherrill will be available to answer any questions or concerns.

## 2014-03-06 NOTE — Progress Notes (Signed)
[id number] Cardiac Rehab Have made two attempts to ambulate pt this afternoon. First time pt getting cleaned by nurse up from incontinent stool. On arrival now pt incontinent of stool again and her daughter cleaning her up. [facility] her [facility], helped to clean her then got pt to recliner. Call light in reach and daughter present. We will follow pt tomorrow. Beatrix FettersHughes, Jaishaun Mcnab G, RN 03/06/2014 3:30 PM

## 2014-03-07 ENCOUNTER — Inpatient Hospital Stay (HOSPITAL_COMMUNITY): Payer: Medicare Other

## 2014-03-07 LAB — HEPARIN INDUCED THROMBOCYTOPENIA PNL
Heparin Induced Plt Ab: NEGATIVE
Patient O.D.: 0.047
UFH High Dose UFH H: 0 % Release
UFH Low Dose 0.1 IU/mL: 1 % Release
UFH Low Dose 0.5 IU/mL: 0 % Release
UFH SRA Result: NEGATIVE

## 2014-03-07 LAB — APTT: APTT: 47 s — AB (ref 24–37)

## 2014-03-07 LAB — GLUCOSE, CAPILLARY
GLUCOSE-CAPILLARY: 133 mg/dL — AB (ref 70–99)
GLUCOSE-CAPILLARY: 119 mg/dL — AB (ref 70–99)
Glucose-Capillary: 115 mg/dL — ABNORMAL HIGH (ref 70–99)
Glucose-Capillary: 119 mg/dL — ABNORMAL HIGH (ref 70–99)

## 2014-03-07 LAB — BASIC METABOLIC PANEL
ANION GAP: 9 (ref 5–15)
BUN: 13 mg/dL (ref 6–23)
CO2: 33 meq/L — AB (ref 19–32)
Calcium: 9.3 mg/dL (ref 8.4–10.5)
Chloride: 100 mEq/L (ref 96–112)
Creatinine, Ser: 0.59 mg/dL (ref 0.50–1.10)
GFR calc non Af Amer: 83 mL/min — ABNORMAL LOW (ref >90)
Glucose, Bld: 105 mg/dL — ABNORMAL HIGH (ref 70–99)
POTASSIUM: 4 meq/L (ref 3.7–5.3)
Sodium: 142 mEq/L (ref 137–147)

## 2014-03-07 LAB — CBC
HEMATOCRIT: 27.4 % — AB (ref 36.0–46.0)
Hemoglobin: 8.8 g/dL — ABNORMAL LOW (ref 12.0–15.0)
MCH: 29.5 pg (ref 26.0–34.0)
MCHC: 32.1 g/dL (ref 30.0–36.0)
MCV: 91.9 fL (ref 78.0–100.0)
PLATELETS: 84 10*3/uL — AB (ref 150–400)
RBC: 2.98 MIL/uL — ABNORMAL LOW (ref 3.87–5.11)
RDW: 15.1 % (ref 11.5–15.5)
WBC: 5.9 10*3/uL (ref 4.0–10.5)

## 2014-03-07 NOTE — Progress Notes (Addendum)
       [address]       [place] [phone]             [phone]          5 Days Post-Op Procedure(s) (LRB): AORTIC VALVE REPLACEMENT (AVR) (N/A)  Subjective: Feeling better today.  Appetite slowly improving.  Not as much wheezing.   Objective: Vital signs in last 24 hours: Patient Vitals for the past 24 hrs:  BP Temp Temp src Pulse Resp SpO2 Weight  03/07/14 0601 136/64 mmHg 98.3 F (36.8 C) Oral 101 19 100 % -  03/07/14 0500 - - - - - - 160 lb 11.5 oz (72.9 kg)  03/06/14 2052 - - - - - 97 % -  03/06/14 2015 124/71 mmHg 98.8 F (37.1 C) Oral 106 18 99 % -  03/06/14 1525 - - - - - 98 % -  03/06/14 1044 152/74 mmHg 98.1 F (36.7 C) Oral 99 20 98 % -  03/06/14 1000 127/72 mmHg - - 106 20 97 % -  03/06/14 0900 126/70 mmHg - - 101 17 100 % -   Current Weight  03/07/14 160 lb 11.5 oz (72.9 kg)     Intake/Output from previous day: 08/21 0701 - 08/22 0700 In: 574.8 [P.O.:440; I.V.:34.8; IV Piggyback:100] Out: 1330 [Urine:1330]  CBGs 158-109-105-115   PHYSICAL EXAM:  Heart: RRR Lungs: Few scattered wheezes Wound: Clean and dry Extremities: No significant LE edema    Lab Results: CBC: Recent Labs  03/06/14 0400 03/07/14 0410  WBC 7.7 5.9  HGB 8.3* 8.8*  HCT 25.0* 27.4*  PLT 60* 84*   BMET:  Recent Labs  03/06/14 0400 03/07/14 0410  NA 144 142  K 3.3* 4.0  CL 104 100  CO2 33* 33*  GLUCOSE 81 105*  BUN 13 13  CREATININE 0.49* 0.59  CALCIUM 8.6 9.3    PT/INR:  Recent Labs  03/05/14 1700  LABPROT 14.8  INR 1.16      Assessment/Plan: S/P Procedure(s) (LRB): AORTIC VALVE REPLACEMENT (AVR) (N/A) CV- Mildly tachy, BPs improved.  Continue Lopressor, Cardura, HCTZ, Lisinopril. Pulm- continue pulm toilet, nebs. CXR not done yet this am.  Will follow. Thrombocytopenia- plts stabilizing, HIT pending.  Appreciate Heme/Onc consult and recommendations.  Continue Bivalirudin for now. Vol overload- continue diuresis.    Expected postop blood loss anemia- H/H generally stable. Endocrine- CBGs stable, no h/o DM (A1C=5.3). Continue SSI. CRPI, ambulation.     LOS: 5 days    COLLINS,GINA H 03/07/2014   Chart reviewed, patient examined, agree with above.

## 2014-03-07 NOTE — Progress Notes (Signed)
CARDIAC REHAB PHASE I   PRE:  Rate/Rhythm: 101  BP:  Sitting: 122/57     SaO2: 99% 2l  MODE:  Ambulation: 150 ft   POST:  Rate/Rhythm: 112  BP:  Sitting: 133/58     SaO2: 100% 2l  9:35am-10:13am  Patient ambulated at a slow but steady pace with no rest breaks.  Patient had not complaints of pain, dizziness, or shortness of breath.  Patient stated that she just felt a little weak.  Patient states that [facility] but she wants to speak with her daughter about it which she states is a retired Charity fundraiserN.  Patient was placed in bed with call bell in reach.    Theresa DutyBrady, Ramonte Mena M, [place] 03/07/2014 10:11 AM

## 2014-03-08 LAB — CBC
HCT: 25.7 % — ABNORMAL LOW (ref 36.0–46.0)
Hemoglobin: 8.3 g/dL — ABNORMAL LOW (ref 12.0–15.0)
MCH: 29.5 pg (ref 26.0–34.0)
MCHC: 32.3 g/dL (ref 30.0–36.0)
MCV: 91.5 fL (ref 78.0–100.0)
PLATELETS: 125 10*3/uL — AB (ref 150–400)
RBC: 2.81 MIL/uL — ABNORMAL LOW (ref 3.87–5.11)
RDW: 15.1 % (ref 11.5–15.5)
WBC: 6.8 10*3/uL (ref 4.0–10.5)

## 2014-03-08 LAB — APTT: aPTT: 49 seconds — ABNORMAL HIGH (ref 24–37)

## 2014-03-08 LAB — GLUCOSE, CAPILLARY
GLUCOSE-CAPILLARY: 106 mg/dL — AB (ref 70–99)
Glucose-Capillary: 134 mg/dL — ABNORMAL HIGH (ref 70–99)
Glucose-Capillary: 155 mg/dL — ABNORMAL HIGH (ref 70–99)
Glucose-Capillary: 125 mg/dL — ABNORMAL HIGH (ref 70–99)

## 2014-03-08 MED ORDER — FUROSEMIDE 40 MG PO TABS
40.0000 mg | ORAL_TABLET | Freq: Once | ORAL | Status: AC
  Administered 2014-03-08: 40 mg via ORAL
  Filled 2014-03-08: qty 1

## 2014-03-08 MED ORDER — PHENOL 1.4 % MT LIQD
1.0000 | OROMUCOSAL | Status: DC | PRN
  Filled 2014-03-08 (×2): qty 177

## 2014-03-08 NOTE — Progress Notes (Signed)
Patient ID: Sara Donaldson, female   DOB: 1929-08-30, 80 y.o.   MRN: [id number] HIT testing negative Platelets up to 84,000  8/22 We can stop bivalirudin.

## 2014-03-08 NOTE — Progress Notes (Addendum)
       [address]       [place] [address]             [phone]          6 Days Post-Op Procedure(s) (LRB): AORTIC VALVE REPLACEMENT (AVR) (N/A)  Subjective: Feels well, no complaints.    Objective: Vital signs in last 24 hours: Patient Vitals for the past 24 hrs:  BP Temp Temp src Pulse Resp SpO2 Weight  03/08/14 0729 - - - - - 98 % -  03/08/14 0457 103/58 mmHg 98.3 F (36.8 C) Oral 105 19 100 % 161 lb 2.5 oz (73.1 kg)  03/07/14 2100 119/95 mmHg 98 F (36.7 C) Oral 112 18 100 % -  03/07/14 1448 112/49 mmHg 98.1 F (36.7 C) Oral 105 20 100 % -   Current Weight  03/08/14 161 lb 2.5 oz (73.1 kg)     Intake/Output from previous day: 08/22 0701 - 08/23 0700 In: 240 [P.O.:240] Out: 725 [Urine:725]  CBGs [phone]   PHYSICAL EXAM:  Heart: RRR, mildly tachy 100-110 Lungs: Clear, no wheezing today Wound: Clean and dry Extremities: No edema    Lab Results: CBC: Recent Labs  03/06/14 0400 03/07/14 0410  WBC 7.7 5.9  HGB 8.3* 8.8*  HCT 25.0* 27.4*  PLT 60* 84*   BMET:  Recent Labs  03/06/14 0400 03/07/14 0410  NA 144 142  K 3.3* 4.0  CL 104 100  CO2 33* 33*  GLUCOSE 81 105*  BUN 13 13  CREATININE 0.49* 0.59  CALCIUM 8.6 9.3    PT/INR:  Recent Labs  03/05/14 1700  LABPROT 14.8  INR 1.16      Assessment/Plan: S/P Procedure(s) (LRB): AORTIC VALVE REPLACEMENT (AVR) (N/A) CV- Remains tachy 100-110, BPs improved but low normal so will not increase beta blocker further at this point. Continue Lopressor, Cardura, HCTZ, Lisinopril.  Pulm- continue pulm toilet, nebs.  Thrombocytopenia- Am CBC not done yet. Plts have been stabilizing, heparin induced platelet assay negative for antibodies. Appreciate Heme/Onc consult and recommendations. Will d/c bivalirudin.   Vol overload- continue diuresis.  Expected postop blood loss anemia- H/H generally stable.  Endocrine- CBGs stable, no h/o DM (A1C=5.3). Will d/c CBGs.  CRPI,  ambulation.     LOS: 6 days    COLLINS,GINA H 03/08/2014   Chart reviewed, patient examined, agree with above. HIT panel negative. Will stop bival. She is still 16 lbs over preop wt but does not look that edematous. Continue diuresis. She needs to walk and work on IS. Her daughter says she has not walked yesterday or today because they were told that there was not a walker for her to use on the floor. Her BP is low normal so will stop Cardura. She is on lisinopril and lopressor. Stop HCTZ while diuresing with lasix.

## 2014-03-08 NOTE — Discharge Summary (Signed)
[address]       [place] [address]             [phone]              Discharge Summary  Name: Sara Donaldson DOB: 08-05-1929 80 y.o. MRN: [id number]   Admission Date: 03/02/2014 Discharge Date: 03/10/2014    Admitting Diagnosis: Moderate to severe aortic stenosis Aortic insufficiency   Discharge Diagnosis:  Moderate to severe aortic stenosis Aortic insufficiency Postoperative thrombocytopenia Expected postoperative blood loss anemia   Past Medical History  Diagnosis Date  . Hypertension   . Aortic stenosis   . Hypercholesterolemia   . Heart murmur   . Bronchitis   . Thrombocytopenia 03/05/2014      Procedures: AORTIC VALVE REPLACEMENT (21 mm [facility] Ease pericardial tissue valve) - 03/02/2014   HPI:  The patient is a 80 y.o. female who has a history of hypertension and hypercholesterolemia, but for the most part has been healthy and without significant physical limitations. She has had a heart murmur for a long time. She was doing well until April of this year. She was working in her garden on April 1 when she says that she felt like she was going to blackout. She felt very lightheaded and dizzy and was having trouble catching her breath. She went to the emergency room. A head CT showed no pathology to explain her symptoms. She did have chronic microvascular disease. She had a cardiac catheterization which showed normal coronary arteries. Her aortic valve gradient was 31 mm of mercury. Her valve area calculated at 0.95 cm. Decision was made to manage her medically with plans for followup echocardiography in one year.   In the interim, she continued to have very predictable presyncopal episodes and shortness of breath with exertion. She says that she gets every time she walks to her mailbox which is about 300 feet from her house. She has not had any of these symptoms at rest. She recently had a transesophageal echocardiogram which  showed a gradient of 33 mm of mercury. Her valve area was 1.04 by planimetry. He also has moderate to severe aortic insufficiency. She was referred to Dr. Dorris Fetch for surgical consultation and it was recommended that she proceed with aortic valve replacement at this time. All risks, benefits and alternatives of surgery were explained in detail, and the patient agreed to proceed.     Hospital Course:  The patient was admitted to [facility] on 03/02/2014. The patient was taken to the operating room and underwent the above procedure.    The postoperative course has been notable for severe thrombocytopenia, with platelets dropping as low as 38,000.  A hematology consult was obtained and the patient was started on bivalirudin pending the results of a heparin induced thrombocytopenia panel.  Her platelets slowly began to improve, and ultimately her platelet antibody test was negative and bivalirudin was discontinued.  She also had symptomatic blood loss anemia and required a transfusion of packed red blood cells.  From a cardiac standpoint, she has remained in sinus rhythm, but has been hypertensive.  She has been restarted on her home blood pressure medications, and her blood pressures have improved.    Overall, the patient is progressing well. She is ambulating in the halls and tolerating a regular diet.  Incisions are healing well.  Platelets are normalizing, and other blood counts are stable.  She remains mildly tachycardic, and blood pressures are controlled.  It is felt that if she continues to progress, she will hopefully be stable for discharge home in the next 24-48 hours.    Recent vital signs:  Filed Vitals:   03/10/14 0500  BP: 135/70  Pulse: 100  Temp: 98.6 F (37 C)  Resp: 16    Recent laboratory studies:  CBC:  Recent Labs  03/08/14 0905 03/09/14 0422  WBC 6.8 7.9  HGB 8.3* 8.9*  HCT 25.7* 27.2*  PLT 125* 144*   BMET:  No results found for this basename: NA, K, CL, CO2,  GLUCOSE, BUN, CREATININE, CALCIUM,  in the last 72 hours  PT/INR:  No results found for this basename: LABPROT, INR,  in the last 72 hours   Discharge Medications:      Medication List    STOP taking these medications       doxazosin 2 MG tablet  Commonly known as:  CARDURA     hydrochlorothiazide 25 MG tablet  Commonly known as:  HYDRODIURIL     verapamil 240 MG CR tablet  Commonly known as:  CALAN-SR      TAKE these medications       aspirin EC 81 MG tablet  Take 81 mg by mouth daily at 12 noon.     CALCIUM 600+D3 600-800 MG-UNIT Tabs  Generic drug:  Calcium Carb-Cholecalciferol  Take 1 tablet by mouth 2 (two) times daily.     furosemide 40 MG tablet  Commonly known as:  LASIX  Take 1 tablet (40 mg total) by mouth daily.     lisinopril 5 MG tablet  Commonly known as:  PRINIVIL,ZESTRIL  Take 1 tablet (5 mg total) by mouth daily.     lovastatin 20 MG tablet  Commonly known as:  MEVACOR  Take 20 mg by mouth 2 (two) times daily.     metoprolol tartrate 25 MG tablet  Commonly known as:  LOPRESSOR  Take 1 tablet (25 mg total) by mouth 2 (two) times daily.     multivitamin with minerals Tabs tablet  Take 1 tablet by mouth every evening. 4pm (Centrum Silver)     niacin 500 MG tablet  Take 500 mg by mouth 2 (two) times daily.     potassium chloride SA 20 MEQ tablet  Commonly known as:  K-DUR,KLOR-CON  Take 1 tablet (20 mEq total) by mouth 2 (two) times daily.     REFRESH OPTIVE ADVANCED 0.5-1-0.5 % Soln  Generic drug:  Carboxymeth-Glycerin-Polysorb  Place 1 drop into both eyes 2 (two) times daily.     traMADol 50 MG tablet  Commonly known as:  ULTRAM  Take 1-2 tablets (50-100 mg total) by mouth every 6 (six) hours as needed for moderate pain.     vitamin E 400 UNIT capsule  Take 400 Units by mouth every evening. 4pm     zolpidem 5 MG tablet  Commonly known as:  AMBIEN  Take 5 mg by mouth at bedtime.         Discharge Instructions:  The patient is  to refrain from driving, heavy lifting or strenuous activity.  May shower daily and clean incisions with soap and water.  May resume regular diet.   Follow Up:  Discharge Instructions   Amb Referral to Cardiac Rehabilitation    Complete by:  As directed            Follow-up Information   Follow up with [facility], MD In 2 weeks. (Please call for a follow up appointment)  Specialty:  Cardiology   Contact information:   [address] [place] [address] [phone]       Follow up with Loreli Slot, MD On 04/14/2014. (Appointment is at 9:30)    Specialty:  Cardiothoracic Surgery   Contact information:   [address] [place] [address] [place] [address] [phone]       Follow up with [facility] On 04/14/2014. (Please get CXR at 8:30, located on first floor on [facility])    Contact information:   [place]      The patient has been discharged on:  1.Beta Blocker: Yes [ x ]  No   If No, reason:    2.Ace Inhibitor/ARB: Yes [ x ]  No [  ]  If No, reason:    3.Statin: Yes [ x ]  No   If No, reason:    4.Ecasa: Yes [ x ]  No   If No, reason:   BARRETT, ERIN 03/10/2014, 7:54 AM

## 2014-03-09 LAB — CBC
HEMATOCRIT: 27.2 % — AB (ref 36.0–46.0)
Hemoglobin: 8.9 g/dL — ABNORMAL LOW (ref 12.0–15.0)
MCH: 30.1 pg (ref 26.0–34.0)
MCHC: 32.7 g/dL (ref 30.0–36.0)
MCV: 91.9 fL (ref 78.0–100.0)
Platelets: 144 10*3/uL — ABNORMAL LOW (ref 150–400)
RBC: 2.96 MIL/uL — ABNORMAL LOW (ref 3.87–5.11)
RDW: 15.6 % — ABNORMAL HIGH (ref 11.5–15.5)
WBC: 7.9 10*3/uL (ref 4.0–10.5)

## 2014-03-09 LAB — GLUCOSE, CAPILLARY
GLUCOSE-CAPILLARY: 110 mg/dL — AB (ref 70–99)
Glucose-Capillary: 102 mg/dL — ABNORMAL HIGH (ref 70–99)
Glucose-Capillary: 118 mg/dL — ABNORMAL HIGH (ref 70–99)

## 2014-03-09 NOTE — Progress Notes (Signed)
CARDIAC REHAB PHASE I   PRE:  Rate/Rhythm: 110 ST with PVC's  BP:  Supine:   Sitting: 129/50  Standing:    SaO2: 95 RA  MODE:  Ambulation: 350 ft   POST:  Rate/Rhythm: 125 ST  BP:  Supine: 112/56  Sitting:   Standing:    SaO2: 92 RA 0900-0935 Assisted X 1 and used walker to ambulate. Gait steady with walker, slow pace. Pt able to walk 350 feet. RA sat in hall 91-94% on several checks. HR during walk 125. Pt to bed per her request after walk with call light in reach.  Melina Copa RN 03/09/2014 9:34 AM

## 2014-03-09 NOTE — Progress Notes (Signed)
Pt ambulated in hallway approximately 350 ft with RW and assist x1.  Pt tolerated well.  Pt to bed with call bell in reach.  Will continue to monitor.

## 2014-03-09 NOTE — Progress Notes (Signed)
Pt is off bedrest; up to bedside commode-tolerated well; back in bed with call bell within reach; daughter at bedside.  Hermina Barters, RN

## 2014-03-09 NOTE — Progress Notes (Addendum)
      [address]       [place] [address]             [phone]      7 Days Post-Op Procedure(s) (LRB): AORTIC VALVE REPLACEMENT (AVR) (N/A)  Subjective:  Sara Donaldson has no complaints this morning.  She had taken her oxygen off and her sats were at 93%.  I removed her oxygen and we will see how she does.  She states her daughter feels she needs another day in the hospital and would prefer to go home tomorrow.  Objective: Vital signs in last 24 hours: Temp:  [98.5 F (36.9 C)-98.9 F (37.2 C)] 98.5 F (36.9 C) (08/24 0458) Pulse Rate:  [101-108] 101 (08/24 0458) Cardiac Rhythm:  [-] Sinus tachycardia (08/23 2030) Resp:  [19-20] 19 (08/24 0458) BP: (138-139)/(62-67) 139/67 mmHg (08/24 0458) SpO2:  [96 %-98 %] 98 % (08/24 0458) Weight:  [160 lb 11.2 oz (72.893 kg)] 160 lb 11.2 oz (72.893 kg) (08/24 0237)  Intake/Output from previous day: 08/23 0701 - 08/24 0700 In: 240 [P.O.:240] Out: -   General appearance: alert, cooperative and no distress Heart: regular rate and rhythm Lungs: clear to auscultation bilaterally Abdomen: soft, non-tender; bowel sounds normal; no masses,  no organomegaly Extremities: extremities normal, atraumatic, no cyanosis or edema Wound: clean and dry  Lab Results:  Recent Labs  03/08/14 0905 03/09/14 0422  WBC 6.8 7.9  HGB 8.3* 8.9*  HCT 25.7* 27.2*  PLT 125* 144*   BMET:  Recent Labs  03/07/14 0410  NA 142  K 4.0  CL 100  CO2 33*  GLUCOSE 105*  BUN 13  CREATININE 0.59  CALCIUM 9.3    PT/INR: No results found for this basename: LABPROT, INR,  in the last 72 hours ABG    Component Value Date/Time   PHART 7.300* 03/02/2014 1900   HCO3 24.7* 03/02/2014 1900   TCO2 20 03/03/2014 1548   ACIDBASEDEF 2.0 03/02/2014 1900   O2SAT 97.0 03/02/2014 1900   CBG (last 3)   Recent Labs  03/08/14 1629 03/08/14 2102 03/09/14 0627  GLUCAP 155* 125* 110*    Assessment/Plan: S/P Procedure(s) (LRB): AORTIC VALVE  REPLACEMENT (AVR) (N/A)  1. CV- Sinus Tach- will increase Lopressor to 50 mg BID continue Lisinopril 2. Pulm- weaning oxygen as tolerated, continue IS 3. Renal- + hypervolemia per weight, no LE edema- continue Lasix 4. Thrombocytopenia- HIT panel negative, stop Bivalirudin 5. Expected Acute Blood Loss Anemia- Hgb 8.9, stable 6. Dispo- patient stable, will increase Lopressor to  BID, likely home in AM   LOS: 7 days    Donaldson, Sara 03/09/2014  Remove EPW's -- plt count now normal Home on lasix patient examined and medical record reviewed,agree with above note. VAN TRIGT III,PETER 03/09/2014

## 2014-03-09 NOTE — Progress Notes (Signed)
Pt ambulated in hallway 137ft with front wheel rolling walker; pt tolerated walk well; pt is up in chair with call bell within reach.  Hermina Barters, RN

## 2014-03-09 NOTE — Progress Notes (Signed)
DC EPW per MD orders and protocol; wires intact intact; pt on bedrest for one hour; vitals Q15 for an hour; daughter at bedside; call bell within reach; will continue to monitor.  Hermina Barters, RN

## 2014-03-10 ENCOUNTER — Inpatient Hospital Stay (HOSPITAL_COMMUNITY): Payer: Medicare Other

## 2014-03-10 MED ORDER — LISINOPRIL 5 MG PO TABS: 5.0000 mg | ORAL_TABLET | Freq: Every day | ORAL | Status: DC

## 2014-03-10 MED ORDER — METOPROLOL TARTRATE 25 MG PO TABS: 25.0000 mg | ORAL_TABLET | Freq: Two times a day (BID) | ORAL | Status: DC

## 2014-03-10 MED ORDER — POTASSIUM CHLORIDE CRYS ER 20 MEQ PO TBCR: 20.0000 meq | EXTENDED_RELEASE_TABLET | Freq: Two times a day (BID) | ORAL | Status: DC

## 2014-03-10 MED ORDER — FUROSEMIDE 40 MG PO TABS: 40.0000 mg | ORAL_TABLET | Freq: Every day | ORAL | Status: DC

## 2014-03-10 MED ORDER — ACETAMINOPHEN 325 MG PO TABS
650.0000 mg | ORAL_TABLET | Freq: Four times a day (QID) | ORAL | Status: DC | PRN
  Administered 2014-03-10: 650 mg via ORAL
  Filled 2014-03-10: qty 2

## 2014-03-10 MED ORDER — TRAMADOL HCL 50 MG PO TABS: 50.0000 mg | ORAL_TABLET | Freq: Four times a day (QID) | ORAL | Status: DC | PRN

## 2014-03-10 NOTE — Progress Notes (Signed)
[id number] Cardiac Rehab Completed discharge education with pt and daughter. They voice understanding. Pt agrees to Outpt. CRP in [place], will send referral. I put recovery from heart surgery video on for them to watch. Beatrix Fetters, RN 03/10/2014 10:28 AM

## 2014-03-10 NOTE — Discharge Instructions (Signed)
Aortic Valve Replacement, Care After °Refer to this sheet in the next few weeks. These instructions provide you with information on caring for yourself after your procedure. Your health care provider may also give you specific instructions. Your treatment has been planned according to current medical practices, but problems sometimes occur. Call your health care provider if you have any problems or questions after your procedure. °HOME CARE INSTRUCTIONS  °· Take medicines only as directed by your health care provider. °· If your health care provider has prescribed elastic stockings, wear them as directed. °· Take frequent naps or rest often throughout the day. °· Avoid lifting over 10 lbs (4.5 kg) or pushing or pulling things with your arms for 6-8 weeks or as directed by your health care provider. °· Avoid driving or airplane travel for 4-6 weeks after surgery or as directed by your health care provider. If you are riding in a car for an extended period, stop every 1-2 hours to stretch your legs. Keep a record of your medicines and medical history with you when traveling. °· Do not drive or operate heavy machinery while taking pain medicine. (narcotics). °· Do not cross your legs. °· Do not use any tobacco products including cigarettes, chewing tobacco, or electronic cigarettes. If you need help quitting, ask your health care provider. °· Do not take baths, swim, or use a hot tub until your health care provider approves. Take showers once your health care provider approves. Pat incisions dry. Do not rub incisions with a washcloth or towel. °· Avoid climbing stairs and using the handrail to pull yourself up for the first 2-3 weeks after surgery. °· Return to work as directed by your health care provider. °· Drink enough fluid to keep your urine clear or pale yellow. °· Do not strain to have a bowel movement. Eat high-fiber foods if you become constipated. You may also take a medicine to help you have a bowel  movement (laxative) as directed by your health care provider. °· Resume sexual activity as directed by your health care provider. Men should not use medicines for erectile dysfunction until their doctor says it is okay. °· If you had a certain type of heart condition in the past, you may need to take antibiotic medicine before having dental work or surgery. Let your dentist and health care providers know if you had one or more of the following: °¨ Previous endocarditis. °¨ An artificial (prosthetic) heart valve. °¨ Congenital heart disease. °SEEK MEDICAL CARE IF: °· You develop a skin rash.   °· You experience sudden changes in your weight. °· You have a fever. °SEEK IMMEDIATE MEDICAL CARE IF:  °· You develop chest pain that is not coming from your incision. °· You have drainage (pus), redness, swelling, or pain at your incision site.   °· You develop shortness of breath or have difficulty breathing.   °· You have increased bleeding from your incision site.   °· You develop light-headedness.   °MAKE SURE YOU:  °· Understand these directions. °· Will watch your condition. °· Will get help right away if you are not doing well or get worse. °Document Released: 01/19/2005 Document Revised: 11/17/2013 Document Reviewed: 04/16/2012 °ExitCare® Patient Information ©2015 [employer]. This information is not intended to replace advice given to you by your health care provider. Make sure you discuss any questions you have with your health care provider. ° °

## 2014-03-10 NOTE — Progress Notes (Signed)
Pt ambulated in hallway 171ft; front wheel rolling walker used with 1X; pt tolerated walk well.  Hermina Barters, RN

## 2014-03-10 NOTE — Care Management Note (Signed)
    Page 1 of 2   03/10/2014     4:36:02 PM CARE MANAGEMENT NOTE 03/10/2014  Patient:  Sara Donaldson, Sara Donaldson   Account Number:  [id number]  Date Initiated:  03/06/2014  Documentation initiated by:  MAYO,HENRIETTA  Subjective/Objective Assessment:   s/p AVR; lives with family    PCP  Cyndi Bender     Action/Plan:   Anticipated DC Date:  03/10/2014   Anticipated DC Plan:  [place]  CM consult      [facility] Choice  HOME HEALTH   Choice offered to / List presented to:  C-1 Patient   DME arranged  Vassie Moselle      DME agency  [place] arranged  HH-1 RN      [place].   Status of service:  Completed, signed off Medicare Important Message given?  YES (If response is "NO", the following Medicare IM given date fields will be blank) Date Medicare IM given:  03/05/2014 Medicare IM given by:  MAYO,HENRIETTA Date Additional Medicare IM given:  03/09/2014 Additional Medicare IM given by:  Ellan Lambert  Discharge Disposition:  [place]  Per UR Regulation:  Reviewed for med. necessity/level of care/duration of stay  If discussed at [place] of Stay Meetings, dates discussed:   03/10/2014    Comments:  03/09/14 Ellan Lambert, RN,BSN [phone] Met with pt and daughter to discuss dc plans.  They are interested in some [place] follow up at dc; would like HHRN to visit.  Also requests RW for home use.  Will obtain orders and arrange services as ordered.  Referral to [facility], per pt choice, for [facility] and DME needs.  Start of care for [facility] 24-48h post dc date.

## 2014-03-10 NOTE — Progress Notes (Addendum)
      [address]       [place] [address]             [phone]      8 Days Post-Op Procedure(s) (LRB): AORTIC VALVE REPLACEMENT (AVR) (N/A)  Subjective:  Ms. Wagoner has no complaints this morning.  She is ambulating with minimal difficulty.  + BM  Objective: Vital signs in last 24 hours: Temp:  [98.3 F (36.8 C)-98.6 F (37 C)] 98.6 F (37 C) (08/25 0500) Pulse Rate:  [99-115] 100 (08/25 0500) Cardiac Rhythm:  [-] Sinus tachycardia (08/25 0729) Resp:  [16-18] 16 (08/25 0500) BP: (104-136)/(58-85) 135/70 mmHg (08/25 0500) SpO2:  [89 %-98 %] 90 % (08/25 0500) Weight:  [159 lb 11.2 oz (72.439 kg)] 159 lb 11.2 oz (72.439 kg) (08/25 0452)  Intake/Output from previous day: 08/24 0701 - 08/25 0700 In: 600 [P.O.:600] Out: 851 [Urine:850; Stool:1]  General appearance: alert, cooperative and no distress Heart: regular rate and rhythm Lungs: clear to auscultation bilaterally Abdomen: soft, non-tender; bowel sounds normal; no masses,  no organomegaly Extremities: edema 1+ Wound: clean and dry  Lab Results:  Recent Labs  03/08/14 0905 03/09/14 0422  WBC 6.8 7.9  HGB 8.3* 8.9*  HCT 25.7* 27.2*  PLT 125* 144*   BMET: No results found for this basename: NA, K, CL, CO2, GLUCOSE, BUN, CREATININE, CALCIUM,  in the last 72 hours  PT/INR: No results found for this basename: LABPROT, INR,  in the last 72 hours ABG    Component Value Date/Time   PHART 7.300* 03/02/2014 1900   HCO3 24.7* 03/02/2014 1900   TCO2 20 03/03/2014 1548   ACIDBASEDEF 2.0 03/02/2014 1900   O2SAT 97.0 03/02/2014 1900   CBG (last 3)   Recent Labs  03/09/14 0627 03/09/14 1107 03/09/14 1642  GLUCAP 110* 102* 118*    Assessment/Plan: S/P Procedure(s) (LRB): AORTIC VALVE REPLACEMENT (AVR) (N/A)  1. CV- NSR, tachycardia improved- continue Lopressor, Lisionpril 2. Pulm- off oxygen, encouraged IS at discharge 3. Renal- remains hypervolemic, weight is trending down will continue  Lasix 4. Thrombocytopenia- HIT negative 5. Dispo- patient doing well, maintaining NSR will d/c home today    LOS: 8 days    BARRETT, ERIN 03/10/2014  patient examined and medical record reviewed,agree with above note. VAN TRIGT III,PETER 03/10/2014

## 2014-03-10 NOTE — Progress Notes (Signed)
DC IV and tele per MD orders and protocol; DC CT sutures per orders and protocol; DC instructions reviewed with pt and daughter at the bedside; no further questions from the pt or daughter; paper prescriptions along with copy of DC given to daughter.  Hermina Barters, RN

## 2014-03-23 DIAGNOSIS — I10 Essential (primary) hypertension: Secondary | ICD-10-CM

## 2014-03-23 DIAGNOSIS — Z48812 Encounter for surgical aftercare following surgery on the circulatory system: Secondary | ICD-10-CM

## 2014-04-10 ENCOUNTER — Other Ambulatory Visit: Payer: Self-pay | Admitting: Thoracic Surgery (Cardiothoracic Vascular Surgery)

## 2014-04-10 DIAGNOSIS — I359 Nonrheumatic aortic valve disorder, unspecified: Secondary | ICD-10-CM

## 2014-04-14 ENCOUNTER — Encounter: Payer: Self-pay | Admitting: Thoracic Surgery (Cardiothoracic Vascular Surgery)

## 2014-04-14 ENCOUNTER — Ambulatory Visit
Admission: RE | Admit: 2014-04-14 | Discharge: 2014-04-14 | Disposition: A | Discharge status: Home / Self Care | Payer: Medicare Other | Source: Ambulatory Visit | Attending: Thoracic Surgery (Cardiothoracic Vascular Surgery) | Admitting: Thoracic Surgery (Cardiothoracic Vascular Surgery)

## 2014-04-14 ENCOUNTER — Ambulatory Visit (INDEPENDENT_AMBULATORY_CARE_PROVIDER_SITE_OTHER): Payer: Self-pay | Admitting: Thoracic Surgery (Cardiothoracic Vascular Surgery)

## 2014-04-14 VITALS — BP 134/78 | HR 88 | Resp 20 | Ht 61.0 in | Wt 159.0 lb

## 2014-04-14 DIAGNOSIS — Z952 Presence of prosthetic heart valve: Secondary | ICD-10-CM

## 2014-04-14 DIAGNOSIS — I359 Nonrheumatic aortic valve disorder, unspecified: Secondary | ICD-10-CM

## 2014-04-14 DIAGNOSIS — Z954 Presence of other heart-valve replacement: Secondary | ICD-10-CM

## 2014-04-14 DIAGNOSIS — I350 Nonrheumatic aortic (valve) stenosis: Secondary | ICD-10-CM

## 2014-04-14 NOTE — Progress Notes (Signed)
HPI:  Sara Donaldson returns for a scheduled postoperative followup visit. She is an 80 year old lady who had an aortic valve replacement with a 21 mm Edwards 1980s pericardial valve on 03/02/2014. She had thrombocytopenia postoperatively. Her HITT panel was negative and the thrombocytopenia resolved prior to discharge.  She was discharged on postoperative day #7.  She says that she's been doing well. She is not having any pain and in fact has never taken any of the pain medication was prescribed. Her exercise tolerance is good. She's been a little reluctant to try to start driving. She denies any swelling in her legs or shortness of breath.  Past Medical History  Diagnosis Date  . Hypertension   . Aortic stenosis   . Hypercholesterolemia   . Heart murmur   . Bronchitis   . Thrombocytopenia 03/05/2014      Current Outpatient Prescriptions  Medication Sig Dispense Refill  . ALPRAZolam (XANAX) 0.25 MG tablet Take 0.25 mg by mouth at bedtime.      Marland Kitchen amLODipine (NORVASC) 5 MG tablet Take 5 mg by mouth daily.      Marland Kitchen aspirin EC 81 MG tablet Take 81 mg by mouth daily at 12 noon.       . Calcium Carb-Cholecalciferol (CALCIUM 600+D3) 600-800 MG-UNIT TABS Take 1 tablet by mouth 2 (two) times daily.      . Carboxymeth-Glycerin-Polysorb (REFRESH OPTIVE ADVANCED) 0.5-1-0.5 % SOLN Place 1 drop into both eyes 2 (two) times daily.      . furosemide (LASIX) 20 MG tablet Take 20 mg by mouth daily.      Marland Kitchen lisinopril (PRINIVIL,ZESTRIL) 5 MG tablet Take 1 tablet (5 mg total) by mouth daily.  30 tablet  3  . lovastatin (MEVACOR) 20 MG tablet Take 20 mg by mouth 2 (two) times daily.      . metoprolol tartrate (LOPRESSOR) 25 MG tablet Take 1 tablet (25 mg total) by mouth 2 (two) times daily.  60 tablet  3  . Multiple Vitamin (MULTIVITAMIN WITH MINERALS) TABS tablet Take 1 tablet by mouth every evening. 4pm (Centrum Silver)      . niacin 500 MG tablet Take 500 mg by mouth 2 (two) times daily.      Marland Kitchen omeprazole  (PRILOSEC) 20 MG capsule Take 20 mg by mouth daily.      . potassium chloride (K-DUR) 10 MEQ tablet Take 10 mEq by mouth 2 (two) times daily.      . vitamin E 400 UNIT capsule Take 400 Units by mouth every evening. 4pm      . zolpidem (AMBIEN) 5 MG tablet Take 5 mg by mouth at bedtime.       No current facility-administered medications for this visit.    Physical Exam BP 134/78  Pulse 88  Resp 20  Ht 5\' 1"  (1.549 m)  Wt 159 lb (72.122 kg)  BMI 30.06 kg/m2  SpO30 1% 80 year old woman in no acute distress Alert and oriented x3 with no focal neurologic deficits Regular rate and rhythm 2/6 systolic murmur Lungs clear with equal breath bilaterally Sternum stable, incision clean dry and intact No peripheral edema  Diagnostic Tests: CHEST 2 VIEW  COMPARISON: 03/10/2014  FINDINGS:  Cardiac shadow is stable. Postsurgical changes are again seen. Lungs  are well aerated bilaterally without focal infiltrate or sizable  effusion. No acute bony abnormality is seen.  IMPRESSION:  No active cardiopulmonary disease.  Electronically Signed  By: Alcide Clever M.D.  On: 04/14/2014 08:57   Impression: 80 year old woman  who is now almost 6 weeks out from aortic valve replacement with a pericardial valve. She is doing extremely well at this point in time. She is not having any significant discomfort. I think she is good to start cardiac rehabilitation.  She may begin driving, appropriate precautions were discussed.  There are no longer any restrictions on lifting. However she should build into new activities gradually.  Plan:  She will followup with Dr. Algie CofferKadakia.  I will be happy to see her back any time if I can be of any further assistance with her care to

## 2014-06-17 ENCOUNTER — Other Ambulatory Visit: Payer: Self-pay | Admitting: Physician Assistant

## 2014-06-19 ENCOUNTER — Other Ambulatory Visit: Payer: Self-pay | Admitting: Physician Assistant

## 2014-06-20 ENCOUNTER — Other Ambulatory Visit: Payer: Self-pay | Admitting: Physician Assistant

## 2014-06-25 ENCOUNTER — Encounter (HOSPITAL_COMMUNITY): Payer: Self-pay | Admitting: Cardiovascular Disease

## 2014-12-13 IMAGING — CR DG CHEST 2V
1 series · 1 of 1 positions shown · non-contrast
Comparison: Prior chest x-ray 03/06/2014

CLINICAL DATA: Post CABG

EXAM:
CHEST  2 VIEW

[w chest lat]
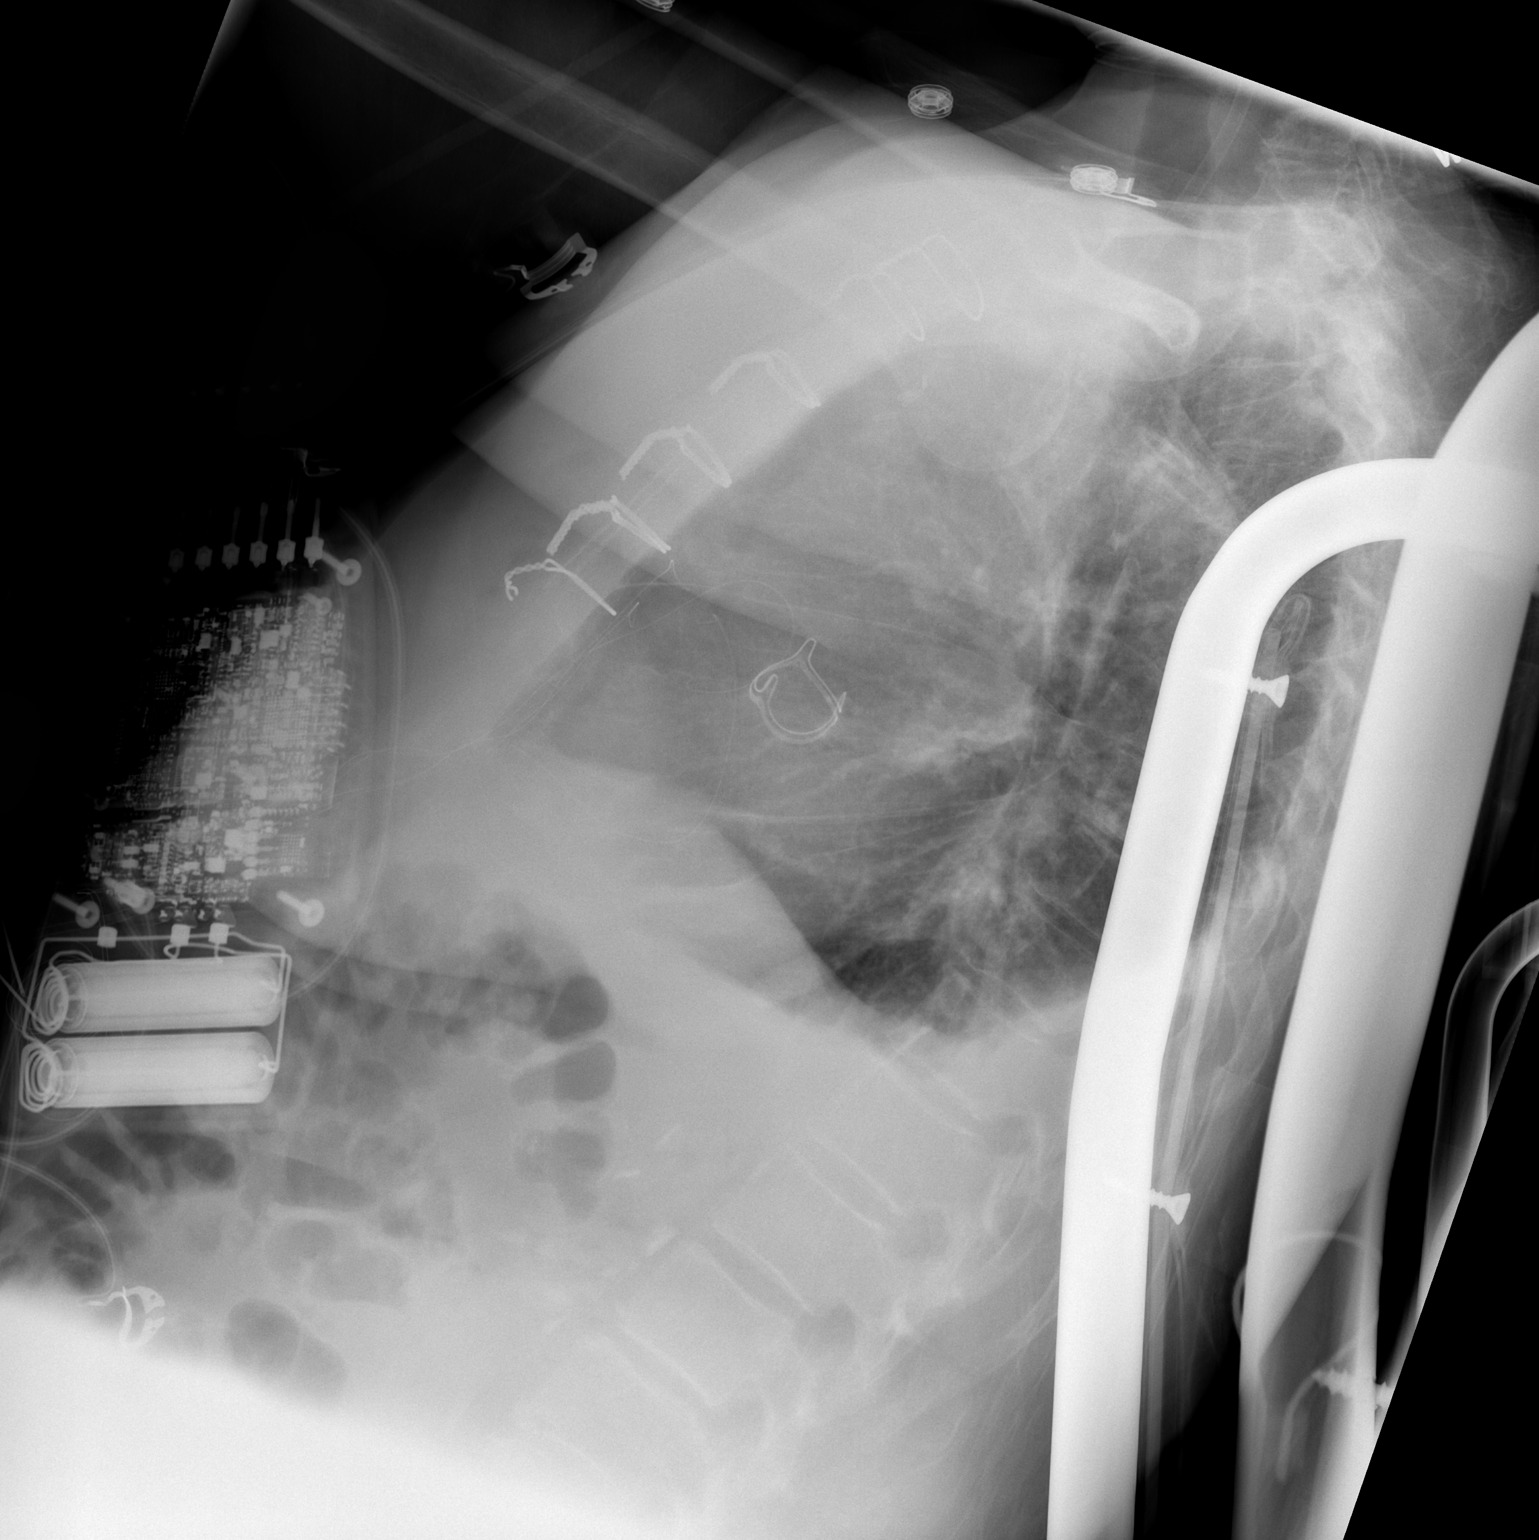

[1 of 1 positions shown; findings below may reference images not displayed]

FINDINGS: The right IJ vascular sheath remains in place. The tip overlies the
upper SVC. Stable cardiomegaly. Atherosclerotic calcifications in
the transverse aorta. Patient is status post median sternotomy with
evidence of aortic valve replacement. Small bilateral layering
pleural effusions and associated bibasilar atelectasis. Improved
pulmonary aeration. No evidence of edema. No pneumothorax. No acute
osseous abnormality.
IMPRESSION: 1. Small bilateral pleural effusions and associated bibasilar
atelectasis.
2. Stable position of right IJ vascular sheath.

## 2015-12-28 ENCOUNTER — Ambulatory Visit
Admission: RE | Admit: 2015-12-28 | Discharge: 2015-12-28 | Disposition: A | Discharge status: Home / Self Care | Payer: Medicare Other | Source: Ambulatory Visit | Attending: Cardiovascular Disease | Admitting: Cardiovascular Disease

## 2015-12-28 ENCOUNTER — Other Ambulatory Visit: Payer: Self-pay | Admitting: Cardiovascular Disease

## 2015-12-28 DIAGNOSIS — R062 Wheezing: Secondary | ICD-10-CM

## 2016-10-04 IMAGING — CR DG CHEST 2V
2 series · 2 of 2 positions shown · non-contrast
Comparison: 04/14/2014.

CLINICAL DATA: Cough.

EXAM:
CHEST  2 VIEW

[w chest pa]
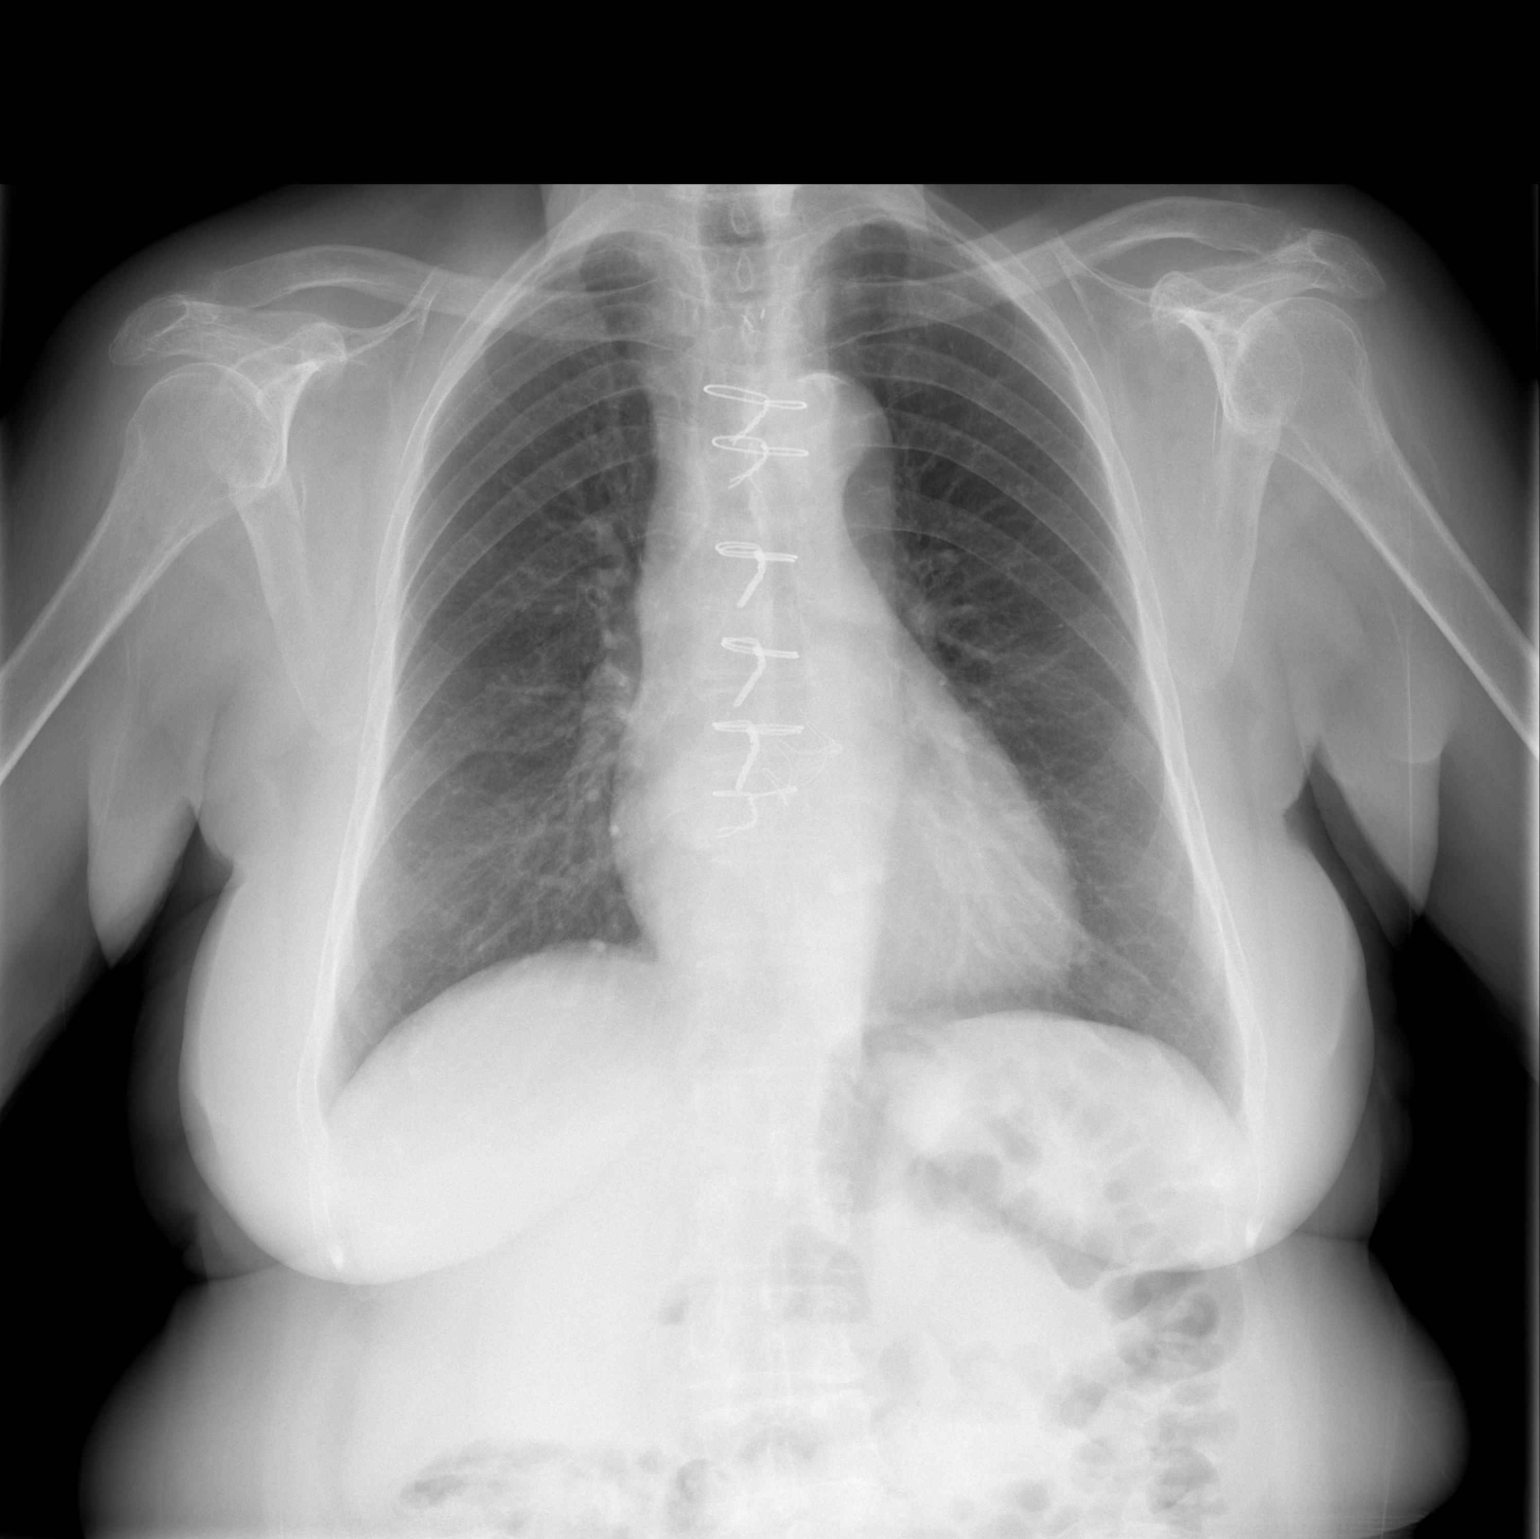

[w chest lat]
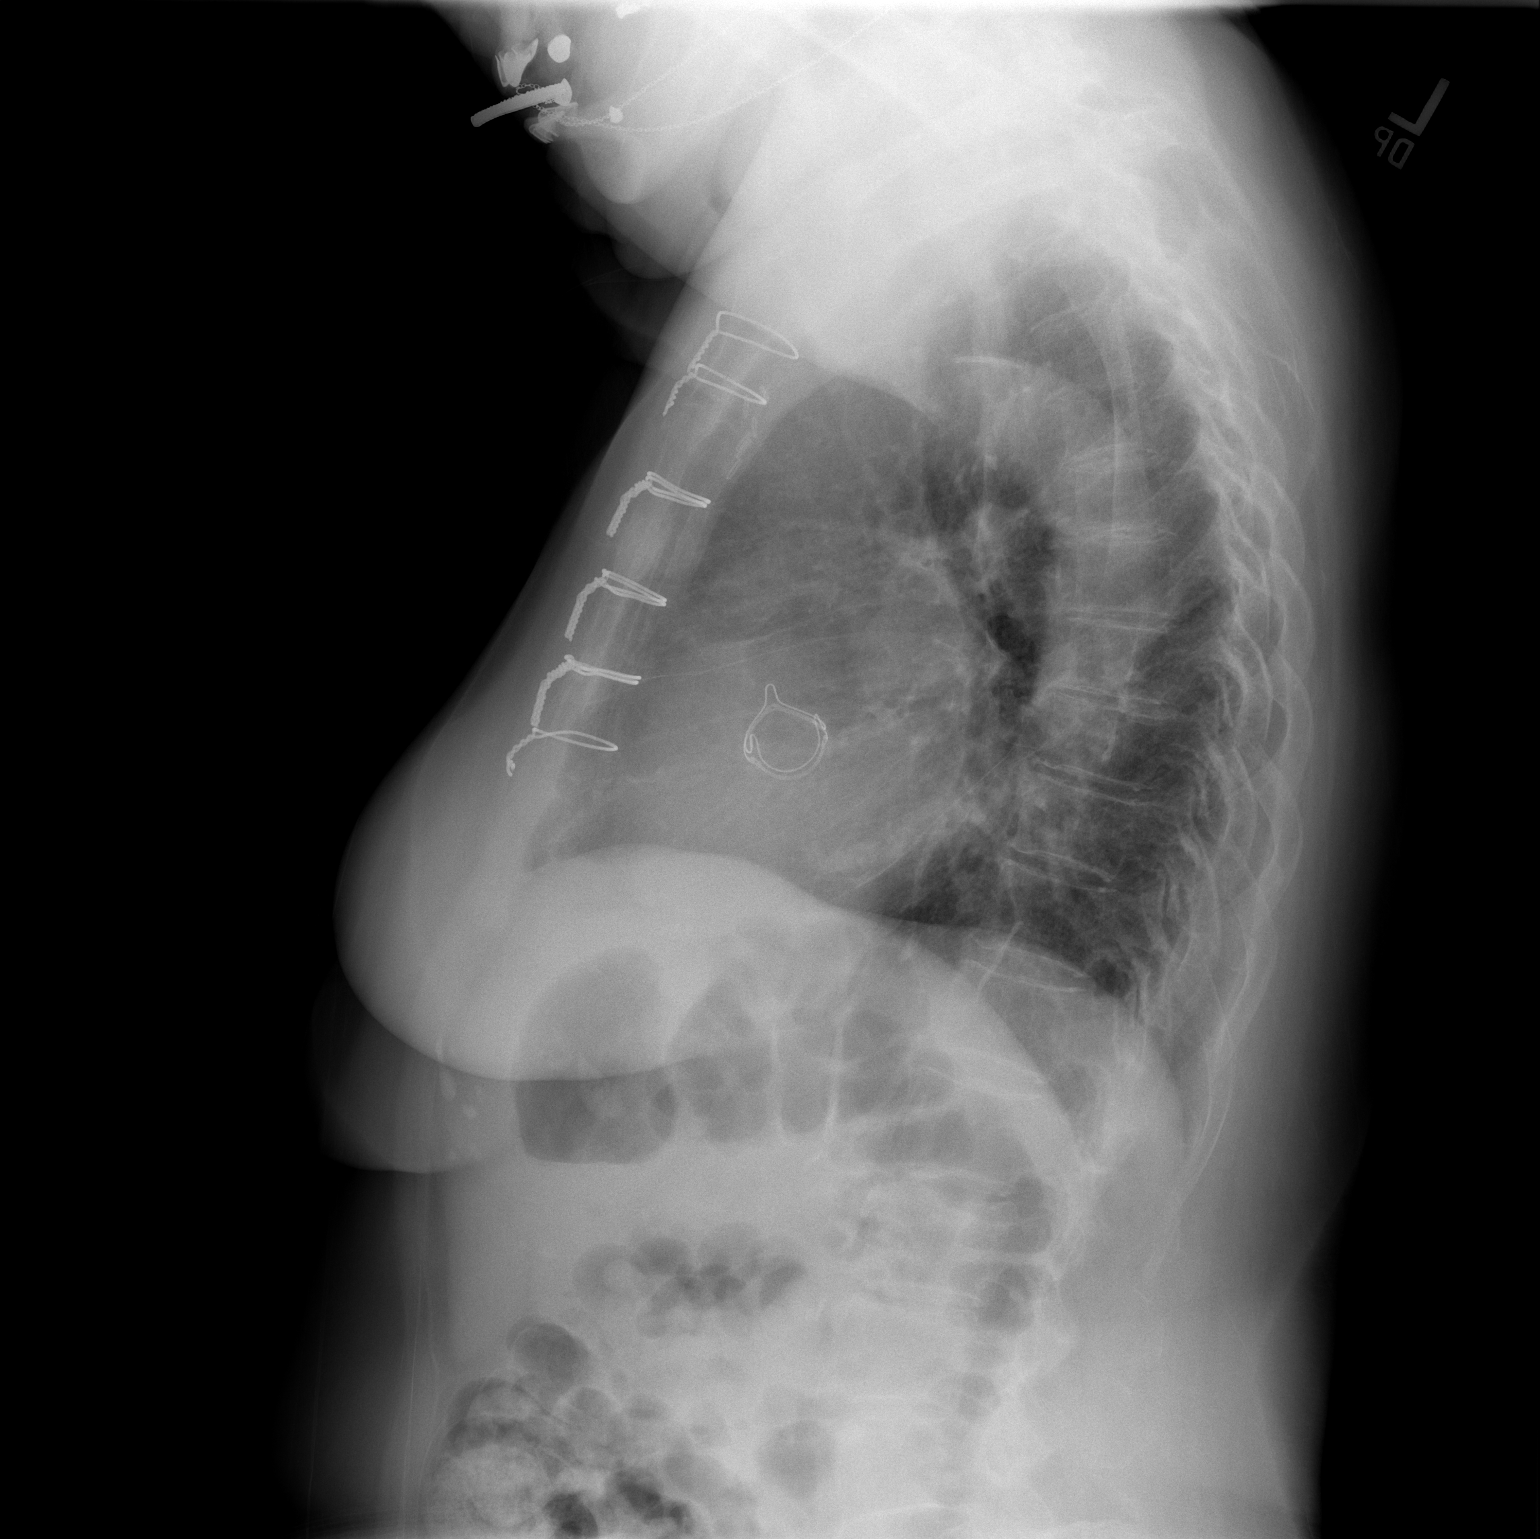

[2 of 2 positions shown; findings below may reference images not displayed]

FINDINGS: Mediastinum hilar structures are normal. Prior cardiac valve
replacement. Stable cardiomegaly. Lungs are clear. No pleural
effusion or pneumothorax. No acute bony abnormality.
IMPRESSION: Prior cardiac valve replacement. Stable cardiomegaly. No CHF. No
acute pulmonary disease.

## 2018-05-27 ENCOUNTER — Other Ambulatory Visit: Payer: Self-pay

## 2018-05-27 ENCOUNTER — Emergency Department
Admission: EM | Admit: 2018-05-27 | Discharge: 2018-05-27 | Disposition: A | Discharge status: Home / Self Care | Payer: Medicare Other | Attending: Emergency Medicine | Admitting: Emergency Medicine

## 2018-05-27 ENCOUNTER — Emergency Department: Payer: Medicare Other

## 2018-05-27 ENCOUNTER — Encounter: Payer: Self-pay | Admitting: Emergency Medicine

## 2018-05-27 DIAGNOSIS — R109 Unspecified abdominal pain: Secondary | ICD-10-CM | POA: Diagnosis not present

## 2018-05-27 DIAGNOSIS — R197 Diarrhea, unspecified: Secondary | ICD-10-CM | POA: Diagnosis not present

## 2018-05-27 DIAGNOSIS — R638 Other symptoms and signs concerning food and fluid intake: Secondary | ICD-10-CM | POA: Insufficient documentation

## 2018-05-27 DIAGNOSIS — R634 Abnormal weight loss: Secondary | ICD-10-CM | POA: Diagnosis not present

## 2018-05-27 DIAGNOSIS — I10 Essential (primary) hypertension: Secondary | ICD-10-CM | POA: Insufficient documentation

## 2018-05-27 LAB — CBC
HEMATOCRIT: 48.4 % — AB (ref 36.0–46.0)
HEMOGLOBIN: 15.9 g/dL — AB (ref 12.0–15.0)
MCH: 29 pg (ref 26.0–34.0)
MCHC: 32.9 g/dL (ref 30.0–36.0)
MCV: 88.2 fL (ref 80.0–100.0)
NRBC: 0 % (ref 0.0–0.2)
Platelets: 222 10*3/uL (ref 150–400)
RBC: 5.49 MIL/uL — ABNORMAL HIGH (ref 3.87–5.11)
RDW: 12.4 % (ref 11.5–15.5)
WBC: 11 10*3/uL — AB (ref 4.0–10.5)

## 2018-05-27 LAB — URINALYSIS, COMPLETE (UACMP) WITH MICROSCOPIC
Bilirubin Urine: NEGATIVE
Glucose, UA: 50 mg/dL — AB
Hgb urine dipstick: NEGATIVE
Ketones, ur: NEGATIVE mg/dL
NITRITE: POSITIVE — AB
PROTEIN: NEGATIVE mg/dL
Specific Gravity, Urine: >1.046 — ABNORMAL HIGH (ref 1.005–1.030)
pH: 5 (ref 5.0–8.0)

## 2018-05-27 LAB — GASTROINTESTINAL PANEL BY PCR, STOOL (REPLACES STOOL CULTURE)
Adenovirus F40/41: NOT DETECTED
Astrovirus: NOT DETECTED
CAMPYLOBACTER SPECIES: NOT DETECTED
CRYPTOSPORIDIUM: NOT DETECTED
CYCLOSPORA CAYETANENSIS: NOT DETECTED
Entamoeba histolytica: NOT DETECTED
Enteroaggregative E coli (EAEC): NOT DETECTED
Enteropathogenic E coli (EPEC): NOT DETECTED
Enterotoxigenic E coli (ETEC): NOT DETECTED
Giardia lamblia: NOT DETECTED
Norovirus GI/GII: NOT DETECTED
PLESIMONAS SHIGELLOIDES: NOT DETECTED
ROTAVIRUS A: NOT DETECTED
SHIGELLA/ENTEROINVASIVE E COLI (EIEC): NOT DETECTED
Salmonella species: NOT DETECTED
Sapovirus (I, II, IV, and V): NOT DETECTED
Shiga like toxin producing E coli (STEC): NOT DETECTED
VIBRIO SPECIES: NOT DETECTED
Vibrio cholerae: NOT DETECTED
YERSINIA ENTEROCOLITICA: NOT DETECTED

## 2018-05-27 LAB — C DIFFICILE QUICK SCREEN W PCR REFLEX
C DIFFICILE (CDIFF) INTERP: NOT DETECTED
C DIFFICLE (CDIFF) ANTIGEN: NEGATIVE
C Diff toxin: NEGATIVE

## 2018-05-27 LAB — COMPREHENSIVE METABOLIC PANEL
ALT: 18 U/L (ref 0–44)
AST: 24 U/L (ref 15–41)
Albumin: 4.4 g/dL (ref 3.5–5.0)
Alkaline Phosphatase: 61 U/L (ref 38–126)
Anion gap: 12 (ref 5–15)
BUN: 11 mg/dL (ref 8–23)
CHLORIDE: 98 mmol/L (ref 98–111)
CO2: 26 mmol/L (ref 22–32)
Calcium: 9.6 mg/dL (ref 8.9–10.3)
Creatinine, Ser: 0.62 mg/dL (ref 0.44–1.00)
GFR calc non Af Amer: >60 mL/min (ref >60)
Glucose, Bld: 122 mg/dL — ABNORMAL HIGH (ref 70–99)
Potassium: 4 mmol/L (ref 3.5–5.1)
SODIUM: 136 mmol/L (ref 135–145)
Total Bilirubin: 0.6 mg/dL (ref 0.3–1.2)
Total Protein: 7.7 g/dL (ref 6.5–8.1)

## 2018-05-27 LAB — LIPASE, BLOOD: LIPASE: 28 U/L (ref 11–51)

## 2018-05-27 MED ORDER — LOPERAMIDE HCL 2 MG PO CAPS
4.0000 mg | ORAL_CAPSULE | Freq: Once | ORAL | Status: AC
  Administered 2018-05-27: 4 mg via ORAL
  Filled 2018-05-27: qty 2

## 2018-05-27 MED ORDER — IOPAMIDOL (ISOVUE-300) INJECTION 61%
100.0000 mL | Freq: Once | INTRAVENOUS | Status: AC | PRN
  Administered 2018-05-27: 100 mL via INTRAVENOUS

## 2018-05-27 NOTE — ED Notes (Signed)
Daughter states her Mother is feeling worse.  Wants to lay down.

## 2018-05-27 NOTE — ED Notes (Signed)
Lab called to make sure they are aware of urine culture add on

## 2018-05-27 NOTE — ED Notes (Signed)
Attempted to get both urine and stool but pt urinated in the same hat as the stool. Provided food and water will attempt again.

## 2018-05-27 NOTE — ED Triage Notes (Signed)
Diarrhea since oct 17.  Has not seen doctor. No vomiting. Denies any pain. VSS. Denies blood in stool.  Appears well and NAD at this time. VSS. Color WNL

## 2018-05-27 NOTE — ED Notes (Signed)
Patient requesting to lie down, moved to recliner in subwait.  Alert and oriented, skin warm and dry, color good.

## 2018-05-27 NOTE — ED Provider Notes (Signed)
[facility] Emergency Department Provider Note       Time seen: ----------------------------------------- 12:32 PM on 05/27/2018 -----------------------------------------   I have reviewed the triage vital signs and the nursing notes.  HISTORY   Chief Complaint Diarrhea    HPI Sara Donaldson is a 84 y.o. female with a history of aortic stenosis, bronchitis, hyperlipidemia, hypertension, thrombocytopenia who presents to the ED for diarrhea for around 3 weeks.  Patient took antidiarrheal agents which slowed down but states the diarrhea has just come back.  She has diarrhea every time she eats.  She has poor appetite and has lost about 5 pounds during the same time..  She denies fevers, chills or other complaints.  Past Medical History:  Diagnosis Date  . Aortic stenosis   . Bronchitis   . Heart murmur   . Hypercholesterolemia   . Hypertension   . Thrombocytopenia (HCC) 03/05/2014    Patient Active Problem List   Diagnosis Date Noted  . Thrombocytopenia (HCC) 03/05/2014  . S/P AVR 03/02/2014  . Aortic valve stenosis, severe 02/09/2014  . Hypertension 02/09/2014  . Hyperlipidemia 02/09/2014    Past Surgical History:  Procedure Laterality Date  . AORTIC VALVE REPLACEMENT N/A 03/02/2014   Procedure: AORTIC VALVE REPLACEMENT (AVR);  Surgeon: Loreli Slot, MD;  Location: [facility] OR;  Service: Open Heart Surgery;  Laterality: N/A;  . CARDIAC CATHETERIZATION     4/10 2015  . EYE SURGERY    . LEFT HEART CATHETERIZATION WITH CORONARY ANGIOGRAM N/A 10/24/2013   Procedure: LEFT HEART CATHETERIZATION WITH CORONARY ANGIOGRAM;  Surgeon: Ricki Rodriguez, MD;  Location: [facility] CATH LAB;  Service: Cardiovascular;  Laterality: N/A;  . TEE WITHOUT CARDIOVERSION N/A 01/12/2014   Procedure: TRANSESOPHAGEAL ECHOCARDIOGRAM (TEE);  Surgeon: Ricki Rodriguez, MD;  Location: [facility] ENDOSCOPY;  Service: Cardiovascular;  Laterality: N/A;    Allergies Tape and Trazodone and  nefazodone  Social History Social History   Tobacco Use  . Smoking status: Never Smoker  . Smokeless tobacco: Never Used  Substance Use Topics  . Alcohol use: No  . Drug use: No   Review of Systems Constitutional: Negative for fever.  Positive for weight loss Cardiovascular: Negative for chest pain. Respiratory: Negative for shortness of breath. Gastrointestinal: Positive for abdominal discomfort, diarrhea Musculoskeletal: Negative for back pain. Skin: Negative for rash. Neurological: Negative for headaches, focal weakness or numbness.  All systems negative/normal/unremarkable except as stated in the HPI  ____________________________________________   PHYSICAL EXAM:  VITAL SIGNS: ED Triage Vitals  Enc Vitals Group     BP 05/27/18 0921 (!) 146/97     Pulse Rate 05/27/18 0921 77     Resp 05/27/18 0921 18     Temp 05/27/18 0921 98.2 F (36.8 C)     Temp Source 05/27/18 0921 Oral     SpO2 05/27/18 0921 96 %     Weight 05/27/18 0919 145 lb (65.8 kg)     Height 05/27/18 0919 5\' 1"  (1.549 m)     Head Circumference --      Peak Flow --      Pain Score 05/27/18 0919 0     Pain Loc --      Pain Edu? --      Excl. in GC? --    Constitutional: Alert and oriented. Well appearing and in no distress. Eyes: Conjunctivae are normal. Normal extraocular movements. Cardiovascular: Normal rate, regular rhythm. No murmurs, rubs, or gallops. Respiratory: Normal respiratory effort without tachypnea nor retractions. Breath  sounds are clear and equal bilaterally. No wheezes/rales/rhonchi. Gastrointestinal: Nonfocal tenderness, no rebound or guarding.  Normal bowel sounds. Musculoskeletal: Nontender with normal range of motion in extremities. No lower extremity tenderness nor edema. Neurologic:  Normal speech and language. No gross focal neurologic deficits are appreciated.  Skin:  Skin is warm, dry and intact. No rash noted. Psychiatric: Mood and affect are normal. Speech and behavior  are normal.  ____________________________________________  ED COURSE:  As part of my medical decision making, I reviewed the following data within the electronic MEDICAL RECORD NUMBER History obtained from family if available, nursing notes, old chart and ekg, as well as notes from prior ED visits. Patient presented for diarrhea for 3 weeks, we will assess with labs and imaging as indicated at this time.   Procedures ____________________________________________   LABS (pertinent positives/negatives)  Labs Reviewed  COMPREHENSIVE METABOLIC PANEL - Abnormal; Notable for the following components:      Result Value   Glucose, Bld 122 (*)    All other components within normal limits  CBC - Abnormal; Notable for the following components:   WBC 11.0 (*)    RBC 5.49 (*)    Hemoglobin 15.9 (*)    HCT 48.4 (*)    All other components within normal limits  GASTROINTESTINAL PANEL BY PCR, STOOL (REPLACES STOOL CULTURE)  C DIFFICILE QUICK SCREEN W PCR REFLEX  LIPASE, BLOOD  URINALYSIS, COMPLETE (UACMP) WITH MICROSCOPIC    RADIOLOGY Images were viewed by me  CT the abdomen pelvis with contrast IMPRESSION: Fluid filled colon, which may be related to nonspecific enteritis/colitis.  Diverticula, without evidence of acute diverticulitis.  Liver steatosis.  Aortic Atherosclerosis (ICD10-I70.0).  New L1 compression fracture with less than 30% height loss.  Degenerative changes of the thoracolumbar spine. ____________________________________________  DIFFERENTIAL DIAGNOSIS   Dehydration, electrolyte abnormality, irritable bowel syndrome, infectious diarrhea, diverticulitis  FINAL ASSESSMENT AND PLAN  Diarrhea   Plan: The patient had presented for persistent diarrhea. Patient's labs were unremarkable. Patient's imaging was also unremarkable, CT findings were more consistent with enteritis.  Gastrointestinal panel was negative.  She will be referred to GI for close outpatient  follow-up.   Ulice Dash, MD   Note: This note was generated in part or whole with voice recognition software. Voice recognition is usually quite accurate but there are transcription errors that can and very often do occur. I apologize for any typographical errors that were not detected and corrected.     Emily Filbert, MD 05/27/18 [phone]

## 2018-05-27 NOTE — ED Notes (Signed)
Another attempt to obtain urine sample-pt only made a couple drops into the front hat and the back at was contaminated again. Pt asked to continue to drink her gingerale and we will attempt a urine cup next time.

## 2018-05-27 NOTE — ED Notes (Signed)
Pt states diarrhea since flu shot. Pt took antidiarrheal and it slowed down but she quit taking it and diarrhea has come back. Diarrhea every time she eats. Pt reports poor appetite but drinking okay

## 2018-05-30 LAB — URINE CULTURE
Culture: >=100000 — AB
Special Requests: NORMAL

## 2018-05-31 NOTE — Progress Notes (Signed)
ED Antimicrobial Stewardship Positive Culture Follow Up   Sara Donaldson is an 84 y.o. female who presented to [facility] on 05/27/2018 with a chief complaint of  Chief Complaint  Patient presents with  . Diarrhea    Recent Results (from the past 720 hour(s))  Gastrointestinal Panel by PCR , Stool     Status: None   Collection Time: 05/27/18 12:28 PM  Result Value Ref Range Status   Campylobacter species NOT DETECTED NOT DETECTED Final   Plesimonas shigelloides NOT DETECTED NOT DETECTED Final   Salmonella species NOT DETECTED NOT DETECTED Final   Yersinia enterocolitica NOT DETECTED NOT DETECTED Final   Vibrio species NOT DETECTED NOT DETECTED Final   Vibrio cholerae NOT DETECTED NOT DETECTED Final   Enteroaggregative E coli (EAEC) NOT DETECTED NOT DETECTED Final   Enteropathogenic E coli (EPEC) NOT DETECTED NOT DETECTED Final   Enterotoxigenic E coli (ETEC) NOT DETECTED NOT DETECTED Final   Shiga like toxin producing E coli (STEC) NOT DETECTED NOT DETECTED Final   Shigella/Enteroinvasive E coli (EIEC) NOT DETECTED NOT DETECTED Final   Cryptosporidium NOT DETECTED NOT DETECTED Final   Cyclospora cayetanensis NOT DETECTED NOT DETECTED Final   Entamoeba histolytica NOT DETECTED NOT DETECTED Final   Giardia lamblia NOT DETECTED NOT DETECTED Final   Adenovirus F40/41 NOT DETECTED NOT DETECTED Final   Astrovirus NOT DETECTED NOT DETECTED Final   Norovirus GI/GII NOT DETECTED NOT DETECTED Final   Rotavirus A NOT DETECTED NOT DETECTED Final   Sapovirus (I, II, IV, and V) NOT DETECTED NOT DETECTED Final    Comment: Performed at [facility], [address] [place], [place] [address]  C difficile quick scan w PCR reflex     Status: None   Collection Time: 05/27/18 12:28 PM  Result Value Ref Range Status   C Diff antigen NEGATIVE NEGATIVE Final   C Diff toxin NEGATIVE NEGATIVE Final   C Diff interpretation No C. difficile detected.  Final    Comment: Performed at [facility], [address] [place], [place] [address]  Urine Culture     Status: Abnormal   Collection Time: 05/27/18  2:42 PM  Result Value Ref Range Status   Specimen Description   Final    URINE, RANDOM Performed at [facility], [address] [place], [place] [address]    Special Requests   Final    Normal Performed at [facility], [address] [place], [place] [address]    Culture >=100,000 COLONIES/mL ESCHERICHIA COLI (A)  Final   Report Status 05/30/2018 FINAL  Final   Organism ID, Bacteria ESCHERICHIA COLI (A)  Final      Susceptibility   Escherichia coli - MIC*    AMPICILLIN <=2 SENSITIVE Sensitive     CEFAZOLIN <=4 SENSITIVE Sensitive     CEFTRIAXONE <=1 SENSITIVE Sensitive     CIPROFLOXACIN <=0.25 SENSITIVE Sensitive     GENTAMICIN <=1 SENSITIVE Sensitive     IMIPENEM <=0.25 SENSITIVE Sensitive     NITROFURANTOIN <=16 SENSITIVE Sensitive     TRIMETH/SULFA <=20 SENSITIVE Sensitive     AMPICILLIN/SULBACTAM <=2 SENSITIVE Sensitive     PIP/TAZO <=4 SENSITIVE Sensitive     Extended ESBL NEGATIVE Sensitive     * >=100,000 COLONIES/mL ESCHERICHIA COLI   Patient with Diarrhea - ER visit WBC 11.0  Afebrile, no urinary symptoms noted.   [x]  Patient discharged originally without antimicrobial agent. Discussed with ED MD, who would like to treat patient  New antibiotic prescription: Cephalexin 500 mg PO q12h x 5 days.  ED Provider: R.Lord  11/15- Called patient at [phone]. Patient's preferred pharmacy is [facility] in [place] [phone]. Called in prescription for above antibiotic and explained to patient if she is in not feeling better in 24-48 hours to follow up with her PCP.   Nolton Denis A 05/31/2018, 1:30 PM Clinical Pharmacist

## 2018-06-08 ENCOUNTER — Emergency Department
Admission: EM | Admit: 2018-06-08 | Discharge: 2018-06-08 | Disposition: A | Discharge status: Home / Self Care | Payer: Medicare Other | Attending: Emergency Medicine | Admitting: Emergency Medicine

## 2018-06-08 ENCOUNTER — Encounter: Payer: Self-pay | Admitting: Emergency Medicine

## 2018-06-08 ENCOUNTER — Other Ambulatory Visit: Payer: Self-pay

## 2018-06-08 DIAGNOSIS — Z952 Presence of prosthetic heart valve: Secondary | ICD-10-CM | POA: Insufficient documentation

## 2018-06-08 DIAGNOSIS — I10 Essential (primary) hypertension: Secondary | ICD-10-CM | POA: Insufficient documentation

## 2018-06-08 DIAGNOSIS — R197 Diarrhea, unspecified: Secondary | ICD-10-CM | POA: Insufficient documentation

## 2018-06-08 DIAGNOSIS — Z7982 Long term (current) use of aspirin: Secondary | ICD-10-CM | POA: Diagnosis not present

## 2018-06-08 DIAGNOSIS — Z79899 Other long term (current) drug therapy: Secondary | ICD-10-CM | POA: Insufficient documentation

## 2018-06-08 LAB — URINALYSIS, COMPLETE (UACMP) WITH MICROSCOPIC
BACTERIA UA: NONE SEEN
Bilirubin Urine: NEGATIVE
Glucose, UA: NEGATIVE mg/dL
Hgb urine dipstick: NEGATIVE
KETONES UR: 5 mg/dL — AB
Nitrite: NEGATIVE
Protein, ur: NEGATIVE mg/dL
Specific Gravity, Urine: 1.019 (ref 1.005–1.030)
pH: 5 (ref 5.0–8.0)

## 2018-06-08 LAB — COMPREHENSIVE METABOLIC PANEL
ALBUMIN: 4.5 g/dL (ref 3.5–5.0)
ALT: 25 U/L (ref 0–44)
AST: 30 U/L (ref 15–41)
Alkaline Phosphatase: 52 U/L (ref 38–126)
Anion gap: 11 (ref 5–15)
BILIRUBIN TOTAL: 0.6 mg/dL (ref 0.3–1.2)
BUN: 18 mg/dL (ref 8–23)
CO2: 24 mmol/L (ref 22–32)
CREATININE: 0.59 mg/dL (ref 0.44–1.00)
Calcium: 9.7 mg/dL (ref 8.9–10.3)
Chloride: 98 mmol/L (ref 98–111)
GFR calc Af Amer: >60 mL/min (ref >60)
GLUCOSE: 117 mg/dL — AB (ref 70–99)
POTASSIUM: 3.8 mmol/L (ref 3.5–5.1)
Sodium: 133 mmol/L — ABNORMAL LOW (ref 135–145)
TOTAL PROTEIN: 7.5 g/dL (ref 6.5–8.1)

## 2018-06-08 LAB — CBC
HEMATOCRIT: 44.2 % (ref 36.0–46.0)
Hemoglobin: 14.5 g/dL (ref 12.0–15.0)
MCH: 28.8 pg (ref 26.0–34.0)
MCHC: 32.8 g/dL (ref 30.0–36.0)
MCV: 87.7 fL (ref 80.0–100.0)
Platelets: 223 10*3/uL (ref 150–400)
RBC: 5.04 MIL/uL (ref 3.87–5.11)
RDW: 12.6 % (ref 11.5–15.5)
WBC: 8.8 10*3/uL (ref 4.0–10.5)
nRBC: 0 % (ref 0.0–0.2)

## 2018-06-08 LAB — LIPASE, BLOOD: Lipase: 33 U/L (ref 11–51)

## 2018-06-08 NOTE — ED Provider Notes (Signed)
[facility] Emergency Department Provider Note  ____________________________________________  Time seen: Approximately 6:02 PM  I have reviewed the triage vital signs and the nursing notes.   HISTORY  Chief Complaint Diarrhea   HPI Sara Donaldson is a 84 y.o. female with history as listed below who presents for evaluation of diarrhea.  Patient was seen here 12 days ago with similar complaints.  Had a negative CT, C. difficile, and stool cultures.  She was sent to GI and followed up with them 4 days ago.  Her symptoms were improving after being put on Imodium.  Patient reports that yesterday evening the diarrhea started again.  She is still on the Imodium.  She has had 4 episodes of watery diarrhea since last night.  No melena, hematochezia, nausea, vomiting, fever, chills, abdominal pain, dizziness, anorexia.  She has had normal appetite.  She did take Keflex for UTI however the last dose was over a week ago.  No prior history of C. Difficile.  Past Medical History:  Diagnosis Date  . Aortic stenosis   . Bronchitis   . Heart murmur   . Hypercholesterolemia   . Hypertension   . Thrombocytopenia (HCC) 03/05/2014    Patient Active Problem List   Diagnosis Date Noted  . Thrombocytopenia (HCC) 03/05/2014  . S/P AVR 03/02/2014  . Aortic valve stenosis, severe 02/09/2014  . Hypertension 02/09/2014  . Hyperlipidemia 02/09/2014    Past Surgical History:  Procedure Laterality Date  . AORTIC VALVE REPLACEMENT N/A 03/02/2014   Procedure: AORTIC VALVE REPLACEMENT (AVR);  Surgeon: Loreli SlotSteven C Hendrickson, MD;  [facility];  Service: Open Heart Surgery;  Laterality: N/A;  . CARDIAC CATHETERIZATION     4/10 2015  . EYE SURGERY    . LEFT HEART CATHETERIZATION WITH CORONARY ANGIOGRAM N/A 10/24/2013   Procedure: LEFT HEART CATHETERIZATION WITH CORONARY ANGIOGRAM;  Surgeon: Ricki RodriguezAjay S Kadakia, MD;  Location: [facility] CATH LAB;  Service: Cardiovascular;  Laterality: N/A;  . TEE  WITHOUT CARDIOVERSION N/A 01/12/2014   Procedure: TRANSESOPHAGEAL ECHOCARDIOGRAM (TEE);  Surgeon: Ricki RodriguezAjay S Kadakia, MD;  [facility];  Service: Cardiovascular;  Laterality: N/A;    Prior to Admission medications   Medication Sig Start Date End Date Taking? Authorizing Provider  ALPRAZolam (XANAX) 0.25 MG tablet Take 0.25 mg by mouth at bedtime.    [provider]  amLODipine (NORVASC) 5 MG tablet Take 5 mg by mouth daily.    [provider]  aspirin EC 81 MG tablet Take 81 mg by mouth daily at 12 noon.     [provider]  Calcium Carb-Cholecalciferol (CALCIUM 600+D3) 600-800 MG-UNIT TABS Take 1 tablet by mouth 2 (two) times daily.    [provider]  Carboxymeth-Glycerin-Polysorb (REFRESH OPTIVE ADVANCED) 0.5-1-0.5 % SOLN Place 1 drop into both eyes 2 (two) times daily.    [provider]  furosemide (LASIX) 20 MG tablet Take 20 mg by mouth daily.    [provider]  lisinopril (PRINIVIL,ZESTRIL) 5 MG tablet Take 1 tablet (5 mg total) by mouth daily. 03/10/14   Barrett, Erin R, PA-C  lovastatin (MEVACOR) 20 MG tablet Take 20 mg by mouth 2 (two) times daily.    [provider]  metoprolol tartrate (LOPRESSOR) 25 MG tablet Take 1 tablet (25 mg total) by mouth 2 (two) times daily. 03/10/14   Barrett, Erin R, PA-C  Multiple Vitamin (MULTIVITAMIN WITH MINERALS) TABS tablet Take 1 tablet by mouth every evening. 4pm (Centrum Silver)  [provider]  niacin 500 MG tablet Take 500 mg by mouth 2 (two) times daily.    [provider]  omeprazole (PRILOSEC) 20 MG capsule Take 20 mg by mouth daily.    [provider]  potassium chloride (K-DUR) 10 MEQ tablet Take 10 mEq by mouth 2 (two) times daily.    [provider]  vitamin E 400 UNIT capsule Take 400 Units by mouth every evening. 4pm    [provider]  zolpidem (AMBIEN) 5 MG tablet Take 5 mg by mouth at bedtime.    [provider]    Allergies Tape and Trazodone and nefazodone  Family History  Problem Relation Age of Onset  . Heart disease Mother   . Heart disease Father   . Hypertension Other     Social History Social History   Tobacco Use  . Smoking status: Never Smoker  . Smokeless tobacco: Never Used  Substance Use Topics  . Alcohol use: No  . Drug use: No    Review of Systems  Constitutional: Negative for fever. Eyes: Negative for visual changes. ENT: Negative for sore throat. Neck: No neck pain  Cardiovascular: Negative for chest pain. Respiratory: Negative for shortness of breath. Gastrointestinal: Negative for abdominal pain, vomiting. + diarrhea. Genitourinary: Negative for dysuria. Musculoskeletal: Negative for back pain. Skin: Negative for rash. Neurological: Negative for headaches, weakness or numbness. Psych: No SI or HI  ____________________________________________   PHYSICAL EXAM:  VITAL SIGNS: ED Triage Vitals [06/08/18 1626]  Enc Vitals Group     BP 139/68     Pulse Rate 99     Resp 18     Temp 97.8 F (36.6 C)     Temp Source Oral     SpO2 97 %     Weight 145 lb 1 oz (65.8 kg)     Height      Head Circumference      Peak Flow      Pain Score 0     Pain Loc      Pain Edu?      Excl. in GC?     Constitutional: Alert and oriented. Well appearing and in no apparent distress. HEENT:      Head: Normocephalic and atraumatic.         Eyes: Conjunctivae are normal. Sclera is non-icteric.       Mouth/Throat: Mucous membranes are moist.       Neck: Supple with no signs of meningismus. Cardiovascular: Regular rate and rhythm. No murmurs, gallops, or rubs. 2+ symmetrical distal pulses are present in all extremities. No JVD. Respiratory: Normal respiratory effort. Lungs are clear to auscultation bilaterally. No wheezes, crackles, or rhonchi.  Gastrointestinal: Soft, non tender, and non distended with positive bowel sounds. No rebound or guarding. Musculoskeletal:  Nontender with normal range of motion in all extremities. No edema, cyanosis, or erythema of extremities. Neurologic: Normal speech and language. Face is symmetric. Moving all extremities. No gross focal neurologic deficits are appreciated. Skin: Skin is warm, dry and intact. No rash noted. Psychiatric: Mood and affect are normal. Speech and behavior are normal.  ____________________________________________   LABS (all labs ordered are listed, but only abnormal results are displayed)  Labs Reviewed  COMPREHENSIVE METABOLIC PANEL - Abnormal; Notable for the following components:      Result Value   Sodium 133 (*)    Glucose, Bld 117 (*)    All other components within normal limits  URINALYSIS, COMPLETE (UACMP) WITH  MICROSCOPIC - Abnormal; Notable for the following components:   Color, Urine YELLOW (*)    APPearance CLEAR (*)    Ketones, ur 5 (*)    Leukocytes, UA TRACE (*)    All other components within normal limits  LIPASE, BLOOD  CBC   ____________________________________________  EKG  none  ____________________________________________  RADIOLOGY  none  ____________________________________________   PROCEDURES  Procedure(s) performed: None Procedures Critical Care performed:  None ____________________________________________   INITIAL IMPRESSION / ASSESSMENT AND PLAN / ED COURSE  84 y.o. female with history as listed below who presents for evaluation of recurrent diarrhea.  Patient is extremely well-appearing and in no distress, she has normal vital signs, abdomen is soft with no tenderness throughout.  This is her second ER visit for same complaint.  The first 1 was 12 days ago with CT showing possible enteritis, negative C. difficile and stool cultures.  She has had only 4 episodes in the last 24 hours.  She has been in the ER for over 4 hours with no bowel movements here.  She is tolerating p.o. with no fever, nausea or vomiting.  She has no abdominal pain.  She  does not look dehydrated.  Her labs are completely normal with normal electrolytes, no leukocytosis.  Her urinalysis shows resolution of her UTI.  Recommended repeating C. difficile studies due to recent antibiotic use but patient prefers to go home as she does not feel she will be able to give [place] a stool sample since she has not had a bowel movement for over 4 hours.  I would expect diarrhea to be more frequent if she had C. difficile.  I did recommend continuing Imodium, increase oral hydration, and a close follow-up with the GI doctor for further evaluation.  No indication for antibiotics at this time with no fever, no abdominal pain, no leukocytosis.  Discussed return precautions for abdominal pain, fever, signs of dehydration.      As part of my medical decision making, I reviewed the following data within the electronic MEDICAL RECORD NUMBER Nursing notes reviewed and incorporated, Labs reviewed , Old chart reviewed, Notes from prior ED visits and [place] Controlled Substance Database    Pertinent labs & imaging results that were available during my care of the patient were reviewed by me and considered in my medical decision making (see chart for details).    ____________________________________________   FINAL CLINICAL IMPRESSION(S) / ED DIAGNOSES  Final diagnoses:  Diarrhea, unspecified type      NEW MEDICATIONS STARTED DURING THIS VISIT:  ED Discharge Orders    None       Note:  This document was prepared using Dragon voice recognition software and may include unintentional dictation errors.    Nita Sickle, MD 06/08/18 [phone]

## 2018-06-08 NOTE — ED Triage Notes (Signed)
Pt to ed with c/o diarrhea.  Pt states she was seen here on Nov 12th for the same problem.  Was treated and got better, however, she recently started on keflex for UTI and now has diarrhea again.  Pt reports 5 episodes of diarrhea in the last 24 hours.

## 2018-07-11 ENCOUNTER — Encounter

## 2018-07-11 ENCOUNTER — Ambulatory Visit: Payer: Medicare Other | Admitting: Gastroenterology

## 2022-12-07 ENCOUNTER — Emergency Department (HOSPITAL_COMMUNITY)
Admission: EM | Admit: 2022-12-07 | Discharge: 2022-12-07 | Disposition: A | Discharge status: Home / Self Care | Payer: Medicare Other | Attending: Emergency Medicine | Admitting: Emergency Medicine

## 2022-12-07 ENCOUNTER — Emergency Department (HOSPITAL_COMMUNITY): Payer: Medicare Other

## 2022-12-07 ENCOUNTER — Encounter (HOSPITAL_COMMUNITY): Payer: Self-pay

## 2022-12-07 ENCOUNTER — Other Ambulatory Visit: Payer: Self-pay

## 2022-12-07 ENCOUNTER — Telehealth: Payer: Self-pay | Admitting: General Practice

## 2022-12-07 DIAGNOSIS — Z7982 Long term (current) use of aspirin: Secondary | ICD-10-CM | POA: Insufficient documentation

## 2022-12-07 DIAGNOSIS — I10 Essential (primary) hypertension: Secondary | ICD-10-CM | POA: Diagnosis not present

## 2022-12-07 DIAGNOSIS — R002 Palpitations: Secondary | ICD-10-CM | POA: Diagnosis present

## 2022-12-07 DIAGNOSIS — Z79899 Other long term (current) drug therapy: Secondary | ICD-10-CM | POA: Diagnosis not present

## 2022-12-07 LAB — CBC
HCT: 46.8 % — ABNORMAL HIGH (ref 36.0–46.0)
Hemoglobin: 15.5 g/dL — ABNORMAL HIGH (ref 12.0–15.0)
MCH: 28.8 pg (ref 26.0–34.0)
MCHC: 33.1 g/dL (ref 30.0–36.0)
MCV: 87 fL (ref 80.0–100.0)
Platelets: 192 10*3/uL (ref 150–400)
RBC: 5.38 MIL/uL — ABNORMAL HIGH (ref 3.87–5.11)
RDW: 12.9 % (ref 11.5–15.5)
WBC: 10.2 10*3/uL (ref 4.0–10.5)
nRBC: 0 % (ref 0.0–0.2)

## 2022-12-07 LAB — BASIC METABOLIC PANEL
Anion gap: 12 (ref 5–15)
BUN: 8 mg/dL (ref 8–23)
CO2: 25 mmol/L (ref 22–32)
Calcium: 9.4 mg/dL (ref 8.9–10.3)
Chloride: 97 mmol/L — ABNORMAL LOW (ref 98–111)
Creatinine, Ser: 0.59 mg/dL (ref 0.44–1.00)
GFR, Estimated: >60 mL/min (ref >60)
Glucose, Bld: 116 mg/dL — ABNORMAL HIGH (ref 70–99)
Potassium: 4.1 mmol/L (ref 3.5–5.1)
Sodium: 134 mmol/L — ABNORMAL LOW (ref 135–145)

## 2022-12-07 LAB — MAGNESIUM: Magnesium: 1.9 mg/dL (ref 1.7–2.4)

## 2022-12-07 MED ORDER — METOPROLOL TARTRATE 25 MG PO TABS
25.0000 mg | ORAL_TABLET | Freq: Two times a day (BID) | ORAL | Status: DC
  Administered 2022-12-07: 25 mg via ORAL
  Filled 2022-12-07: qty 1

## 2022-12-07 NOTE — ED Triage Notes (Signed)
Pt. Stated, I woke up at 0300 with a fast heart rate. I stayed up. It only went down to 108.

## 2022-12-07 NOTE — Telephone Encounter (Signed)
Sara Donaldson and daughter walked into office and said she was in Afib, said she was a patient of Dr Algie Coffer and he was not in the office. I went and got Dr Lenord Fellers to access her. After Dr Lenord Fellers accessed her and determined she was not in immediate danger Dr Lenord Fellers ask me to call Dr Algie Coffer answering service. I did and he immediately called back and talk to me then the patient and advised her to go to [facility] emergency room to be worked up so he could admit her.

## 2022-12-07 NOTE — ED Provider Notes (Signed)
[place] EMERGENCY DEPARTMENT AT [facility] Provider Note   CSN: [id number] Arrival date & time: 12/07/22  1029     History  Chief Complaint  Patient presents with   Irregular Heart Beat   Tachycardia    Sara Donaldson is a 89 y.o. female.  HPI Patient presents with her daughter who assists with the history.  Additional details per chart review.  She presents after an episode overnight with tachycardia, palpitations, that is resolved.  Currently she is in her usual state of health, denies complaints.  She has a history of aortic valve repair in the distant past, but no known history of A-fib, a flutter, SVT. No recent change in medication, diet, activity. Twice overnight she noticed tachycardia, lasting for several moments, with rate as high as 160. No clear intervention, symptoms resolved prior to ED arrival.    Home Medications Prior to Admission medications   Medication Sig Start Date End Date Taking? Authorizing Provider  ALPRAZolam (XANAX) 0.25 MG tablet Take 0.25 mg by mouth at bedtime.    [provider]  amLODipine (NORVASC) 5 MG tablet Take 5 mg by mouth daily.    [provider]  aspirin EC 81 MG tablet Take 81 mg by mouth daily at 12 noon.     [provider]  Calcium Carb-Cholecalciferol (CALCIUM 600+D3) 600-800 MG-UNIT TABS Take 1 tablet by mouth 2 (two) times daily.    [provider]  Carboxymeth-Glycerin-Polysorb (REFRESH OPTIVE ADVANCED) 0.5-1-0.5 % SOLN Place 1 drop into both eyes 2 (two) times daily.    [provider]  furosemide (LASIX) 20 MG tablet Take 20 mg by mouth daily.    [provider]  lisinopril (PRINIVIL,ZESTRIL) 5 MG tablet Take 1 tablet (5 mg total) by mouth daily. 03/10/14   Barrett, Erin R, PA-C  lovastatin (MEVACOR) 20 MG tablet Take 20 mg by mouth 2 (two) times daily.    [provider]  metoprolol tartrate (LOPRESSOR) 25 MG tablet Take 1 tablet (25 mg total) by mouth 2  (two) times daily. 03/10/14   Barrett, Erin R, PA-C  Multiple Vitamin (MULTIVITAMIN WITH MINERALS) TABS tablet Take 1 tablet by mouth every evening. 4pm (Centrum Silver)    [provider]  niacin 500 MG tablet Take 500 mg by mouth 2 (two) times daily.    [provider]  omeprazole (PRILOSEC) 20 MG capsule Take 20 mg by mouth daily.    [provider]  potassium chloride (K-DUR) 10 MEQ tablet Take 10 mEq by mouth 2 (two) times daily.    [provider]  vitamin E 400 UNIT capsule Take 400 Units by mouth every evening. 4pm    [provider]  zolpidem (AMBIEN) 5 MG tablet Take 5 mg by mouth at bedtime.    [provider]      Allergies    Tape and Trazodone and nefazodone    Review of Systems   Review of Systems  All other systems reviewed and are negative.   Physical Exam Updated Vital Signs BP (!) 173/85   Pulse 84   Temp 97.6 F (36.4 C) (Oral)   Resp (!) 22   Ht 5\' 1"  (1.549 m)   Wt 62.1 kg   SpO2 96%   BMI 25.89 kg/m  Physical Exam Vitals and nursing note reviewed.  Constitutional:      General: She is not in acute distress.    Appearance: She is well-developed.  HENT:  Head: Normocephalic and atraumatic.  Eyes:     Conjunctiva/sclera: Conjunctivae normal.  Cardiovascular:     Rate and Rhythm: Normal rate and regular rhythm.  Pulmonary:     Effort: Pulmonary effort is normal. No respiratory distress.     Breath sounds: Normal breath sounds. No stridor.  Abdominal:     General: There is no distension.  Skin:    General: Skin is warm and dry.  Neurological:     Mental Status: She is alert and oriented to person, place, and time.     Cranial Nerves: No cranial nerve deficit.  Psychiatric:        Mood and Affect: Mood normal.     ED Results / Procedures / Treatments   Labs (all labs ordered are listed, but only abnormal results are displayed) Labs Reviewed  BASIC METABOLIC PANEL - Abnormal; Notable for  the following components:      Result Value   Sodium 134 (*)    Chloride 97 (*)    Glucose, Bld 116 (*)    All other components within normal limits  CBC - Abnormal; Notable for the following components:   RBC 5.38 (*)    Hemoglobin 15.5 (*)    HCT 46.8 (*)    All other components within normal limits  MAGNESIUM    EKG EKG Interpretation  Date/Time:  Thursday Dec 07 2022 10:41:32 EDT Ventricular Rate:  85 PR Interval:  206 QRS Duration: 86 QT Interval:  380 QTC Calculation: 452 R Axis:   12 Text Interpretation: Normal sinus rhythm with sinus arrhythmia Septal infarct , age undetermined Abnormal ECG Confirmed by Gerhard Munch [phone] on 12/07/2022 11:43:43 AM  Radiology DG Chest 1 View  Result Date: 12/07/2022 CLINICAL DATA:  Atrial fibrillation.  Tachycardia. EXAM: CHEST  1 VIEW COMPARISON:  12/28/2015 FINDINGS: Previous median sternotomy and aortic valve replacement. Mitral annular calcification is visible. Aortic atherosclerotic calcification is visible. The pulmonary vascularity is normal. The lungs are clear. No effusions. No acute bone finding. IMPRESSION: No active disease. Previous median sternotomy and aortic valve replacement. Mitral annular calcification. Electronically Signed   By: Paulina Fusi M.D.   On: 12/07/2022 11:26    Procedures Procedures    Medications Ordered in ED Medications  metoprolol tartrate (LOPRESSOR) tablet 25 mg (25 mg Oral Given 12/07/22 1220)    ED Course/ Medical Decision Making/ A&P                             Medical Decision Making Adult female with history of aortic valve repair, hypertension presents with palpitations, tachycardia, concern for prior SVT versus A-fib, now resolved.  Electrolytes, x-ray, EKG ordered.  Cardiac 85 sinus normal Pulse ox 100% room air normal   Amount and/or Complexity of Data Reviewed Independent Historian: caregiver External Data Reviewed: notes.    Details: I reviewed the patient's notes from  aortic valve repair 2015 Labs: ordered. Decision-making details documented in ED Course. Radiology: ordered and independent interpretation performed. ECG/medicine tests: ordered and independent interpretation performed. Decision-making details documented in ED Course.  Risk Prescription drug management. Decision regarding hospitalization.   Update: Patient mains in no distress, labs reviewed, unremarkable, ECG sinus rhythm, she has no evidence for ongoing arrhythmia, tachycardia, or any complaints. Though there are some suspicion for A-fib, without any evidence for persistency, any complaints, no indication for additional new medications today, but the patient did have expedited follow-up with her cardiologist.  Final Clinical Impression(s) / ED Diagnoses Final diagnoses:  Palpitations    Rx / DC Orders ED Discharge Orders     None         Gerhard Munch, MD 12/07/22 1344

## 2022-12-07 NOTE — Discharge Instructions (Addendum)
As discussed, today's evaluation has been generally reassuring.  Your episode of palpitations and fast heart rate may have been due to a nonsustained period of abnormal cardiac electrical activity.  Our cardiology office will contact you for appropriate ongoing outpatient care.  Return here for concerning changes in your condition.

## 2023-06-10 ENCOUNTER — Emergency Department (HOSPITAL_COMMUNITY): Payer: Medicare Other | Admitting: Anesthesiology

## 2023-06-10 ENCOUNTER — Emergency Department (HOSPITAL_COMMUNITY): Payer: Medicare Other

## 2023-06-10 ENCOUNTER — Encounter (HOSPITAL_COMMUNITY)
Admission: EM | Disposition: A | Discharge status: Rehabilitation Facility | Payer: Self-pay | Source: Home / Self Care | Attending: Neurology

## 2023-06-10 ENCOUNTER — Inpatient Hospital Stay (HOSPITAL_COMMUNITY)
Admission: EM | Admit: 2023-06-10 | Discharge: 2023-06-13 | DRG: 023 | Disposition: A | Discharge status: Rehabilitation Facility | Payer: Medicare Other | Attending: Neurology | Admitting: Neurology

## 2023-06-10 DIAGNOSIS — I700 Atherosclerosis of aorta: Secondary | ICD-10-CM | POA: Diagnosis present

## 2023-06-10 DIAGNOSIS — G8191 Hemiplegia, unspecified affecting right dominant side: Secondary | ICD-10-CM | POA: Diagnosis present

## 2023-06-10 DIAGNOSIS — I4891 Unspecified atrial fibrillation: Secondary | ICD-10-CM | POA: Diagnosis present

## 2023-06-10 DIAGNOSIS — I959 Hypotension, unspecified: Secondary | ICD-10-CM | POA: Diagnosis present

## 2023-06-10 DIAGNOSIS — G936 Cerebral edema: Secondary | ICD-10-CM | POA: Diagnosis present

## 2023-06-10 DIAGNOSIS — Z8249 Family history of ischemic heart disease and other diseases of the circulatory system: Secondary | ICD-10-CM | POA: Diagnosis not present

## 2023-06-10 DIAGNOSIS — I69351 Hemiplegia and hemiparesis following cerebral infarction affecting right dominant side: Secondary | ICD-10-CM | POA: Diagnosis not present

## 2023-06-10 DIAGNOSIS — I69322 Dysarthria following cerebral infarction: Secondary | ICD-10-CM | POA: Diagnosis not present

## 2023-06-10 DIAGNOSIS — I350 Nonrheumatic aortic (valve) stenosis: Secondary | ICD-10-CM | POA: Diagnosis present

## 2023-06-10 DIAGNOSIS — Z7982 Long term (current) use of aspirin: Secondary | ICD-10-CM

## 2023-06-10 DIAGNOSIS — Z952 Presence of prosthetic heart valve: Secondary | ICD-10-CM | POA: Diagnosis not present

## 2023-06-10 DIAGNOSIS — F419 Anxiety disorder, unspecified: Secondary | ICD-10-CM | POA: Diagnosis present

## 2023-06-10 DIAGNOSIS — I6602 Occlusion and stenosis of left middle cerebral artery: Secondary | ICD-10-CM | POA: Diagnosis present

## 2023-06-10 DIAGNOSIS — H5340 Unspecified visual field defects: Secondary | ICD-10-CM | POA: Diagnosis present

## 2023-06-10 DIAGNOSIS — H53461 Homonymous bilateral field defects, right side: Secondary | ICD-10-CM | POA: Diagnosis present

## 2023-06-10 DIAGNOSIS — R531 Weakness: Secondary | ICD-10-CM | POA: Diagnosis present

## 2023-06-10 DIAGNOSIS — I639 Cerebral infarction, unspecified: Principal | ICD-10-CM

## 2023-06-10 DIAGNOSIS — E7800 Pure hypercholesterolemia, unspecified: Secondary | ICD-10-CM | POA: Diagnosis present

## 2023-06-10 DIAGNOSIS — I63512 Cerebral infarction due to unspecified occlusion or stenosis of left middle cerebral artery: Secondary | ICD-10-CM | POA: Diagnosis present

## 2023-06-10 DIAGNOSIS — R4701 Aphasia: Secondary | ICD-10-CM | POA: Diagnosis present

## 2023-06-10 DIAGNOSIS — Z66 Do not resuscitate: Secondary | ICD-10-CM | POA: Diagnosis present

## 2023-06-10 DIAGNOSIS — Z888 Allergy status to other drugs, medicaments and biological substances status: Secondary | ICD-10-CM | POA: Diagnosis not present

## 2023-06-10 DIAGNOSIS — D72829 Elevated white blood cell count, unspecified: Secondary | ICD-10-CM | POA: Diagnosis present

## 2023-06-10 DIAGNOSIS — Z79899 Other long term (current) drug therapy: Secondary | ICD-10-CM

## 2023-06-10 DIAGNOSIS — Z7901 Long term (current) use of anticoagulants: Secondary | ICD-10-CM | POA: Diagnosis not present

## 2023-06-10 DIAGNOSIS — I6523 Occlusion and stenosis of bilateral carotid arteries: Secondary | ICD-10-CM | POA: Diagnosis present

## 2023-06-10 DIAGNOSIS — I4819 Other persistent atrial fibrillation: Secondary | ICD-10-CM | POA: Diagnosis not present

## 2023-06-10 DIAGNOSIS — G4700 Insomnia, unspecified: Secondary | ICD-10-CM | POA: Diagnosis not present

## 2023-06-10 DIAGNOSIS — I2510 Atherosclerotic heart disease of native coronary artery without angina pectoris: Secondary | ICD-10-CM | POA: Diagnosis present

## 2023-06-10 DIAGNOSIS — E785 Hyperlipidemia, unspecified: Secondary | ICD-10-CM | POA: Diagnosis not present

## 2023-06-10 DIAGNOSIS — I480 Paroxysmal atrial fibrillation: Secondary | ICD-10-CM | POA: Diagnosis present

## 2023-06-10 DIAGNOSIS — R29810 Facial weakness: Secondary | ICD-10-CM | POA: Diagnosis present

## 2023-06-10 DIAGNOSIS — E669 Obesity, unspecified: Secondary | ICD-10-CM | POA: Diagnosis present

## 2023-06-10 DIAGNOSIS — I10 Essential (primary) hypertension: Secondary | ICD-10-CM | POA: Diagnosis present

## 2023-06-10 DIAGNOSIS — Z6827 Body mass index (BMI) 27.0-27.9, adult: Secondary | ICD-10-CM | POA: Diagnosis not present

## 2023-06-10 DIAGNOSIS — H518 Other specified disorders of binocular movement: Secondary | ICD-10-CM | POA: Diagnosis present

## 2023-06-10 DIAGNOSIS — I7090 Unspecified atherosclerosis: Secondary | ICD-10-CM | POA: Insufficient documentation

## 2023-06-10 DIAGNOSIS — R29717 NIHSS score 17: Secondary | ICD-10-CM | POA: Diagnosis present

## 2023-06-10 DIAGNOSIS — F5104 Psychophysiologic insomnia: Secondary | ICD-10-CM | POA: Diagnosis present

## 2023-06-10 DIAGNOSIS — I69392 Facial weakness following cerebral infarction: Secondary | ICD-10-CM | POA: Diagnosis not present

## 2023-06-10 DIAGNOSIS — I63412 Cerebral infarction due to embolism of left middle cerebral artery: Secondary | ICD-10-CM | POA: Diagnosis not present

## 2023-06-10 DIAGNOSIS — I482 Chronic atrial fibrillation, unspecified: Secondary | ICD-10-CM | POA: Diagnosis not present

## 2023-06-10 DIAGNOSIS — I69320 Aphasia following cerebral infarction: Secondary | ICD-10-CM | POA: Diagnosis not present

## 2023-06-10 HISTORY — PX: IR PERCUTANEOUS ART THROMBECTOMY/INFUSION INTRACRANIAL INC DIAG ANGIO: IMG6087

## 2023-06-10 HISTORY — PX: RADIOLOGY WITH ANESTHESIA: SHX6223

## 2023-06-10 HISTORY — PX: IR CT HEAD LTD: IMG2386

## 2023-06-10 LAB — I-STAT CHEM 8, ED
BUN: 15 mg/dL (ref 8–23)
Calcium, Ion: 1.13 mmol/L — ABNORMAL LOW (ref 1.15–1.40)
Chloride: 99 mmol/L (ref 98–111)
Creatinine, Ser: 0.6 mg/dL (ref 0.44–1.00)
Glucose, Bld: 154 mg/dL — ABNORMAL HIGH (ref 70–99)
HCT: 50 % — ABNORMAL HIGH (ref 36.0–46.0)
Hemoglobin: 17 g/dL — ABNORMAL HIGH (ref 12.0–15.0)
Potassium: 3.8 mmol/L (ref 3.5–5.1)
Sodium: 140 mmol/L (ref 135–145)
TCO2: 27 mmol/L (ref 22–32)

## 2023-06-10 LAB — HEMOGLOBIN A1C
Hgb A1c MFr Bld: 5.4 % (ref 4.8–5.6)
Mean Plasma Glucose: 108.28 mg/dL

## 2023-06-10 LAB — ETHANOL: Alcohol, Ethyl (B): <10 mg/dL (ref <10)

## 2023-06-10 LAB — DIFFERENTIAL
Abs Immature Granulocytes: 0.08 10*3/uL — ABNORMAL HIGH (ref 0.00–0.07)
Basophils Absolute: 0.1 10*3/uL (ref 0.0–0.1)
Basophils Relative: 1 %
Eosinophils Absolute: 0.1 10*3/uL (ref 0.0–0.5)
Eosinophils Relative: 1 %
Immature Granulocytes: 1 %
Lymphocytes Relative: 7 %
Lymphs Abs: 1 10*3/uL (ref 0.7–4.0)
Monocytes Absolute: 0.7 10*3/uL (ref 0.1–1.0)
Monocytes Relative: 5 %
Neutro Abs: 12 10*3/uL — ABNORMAL HIGH (ref 1.7–7.7)
Neutrophils Relative %: 85 %

## 2023-06-10 LAB — COMPREHENSIVE METABOLIC PANEL
ALT: 19 U/L (ref 0–44)
AST: 31 U/L (ref 15–41)
Albumin: 4.1 g/dL (ref 3.5–5.0)
Alkaline Phosphatase: 67 U/L (ref 38–126)
Anion gap: 13 (ref 5–15)
BUN: 13 mg/dL (ref 8–23)
CO2: 26 mmol/L (ref 22–32)
Calcium: 9.4 mg/dL (ref 8.9–10.3)
Chloride: 99 mmol/L (ref 98–111)
Creatinine, Ser: 0.66 mg/dL (ref 0.44–1.00)
GFR, Estimated: >60 mL/min (ref >60)
Glucose, Bld: 153 mg/dL — ABNORMAL HIGH (ref 70–99)
Potassium: 3.7 mmol/L (ref 3.5–5.1)
Sodium: 138 mmol/L (ref 135–145)
Total Bilirubin: 0.6 mg/dL (ref <1.2)
Total Protein: 7.5 g/dL (ref 6.5–8.1)

## 2023-06-10 LAB — MRSA NEXT GEN BY PCR, NASAL: MRSA by PCR Next Gen: NOT DETECTED

## 2023-06-10 LAB — CBC
HCT: 47.3 % — ABNORMAL HIGH (ref 36.0–46.0)
HCT: 43 % (ref 36.0–46.0)
Hemoglobin: 15.4 g/dL — ABNORMAL HIGH (ref 12.0–15.0)
Hemoglobin: 14.3 g/dL (ref 12.0–15.0)
MCH: 29.1 pg (ref 26.0–34.0)
MCH: 29.7 pg (ref 26.0–34.0)
MCHC: 32.6 g/dL (ref 30.0–36.0)
MCHC: 33.3 g/dL (ref 30.0–36.0)
MCV: 89.2 fL (ref 80.0–100.0)
MCV: 89.2 fL (ref 80.0–100.0)
Platelets: 187 10*3/uL (ref 150–400)
Platelets: 180 10*3/uL (ref 150–400)
RBC: 5.3 MIL/uL — ABNORMAL HIGH (ref 3.87–5.11)
RBC: 4.82 MIL/uL (ref 3.87–5.11)
RDW: 12.9 % (ref 11.5–15.5)
RDW: 12.9 % (ref 11.5–15.5)
WBC: 14 10*3/uL — ABNORMAL HIGH (ref 4.0–10.5)
WBC: 12.2 10*3/uL — ABNORMAL HIGH (ref 4.0–10.5)
nRBC: 0 % (ref 0.0–0.2)
nRBC: 0.2 % (ref 0.0–0.2)

## 2023-06-10 LAB — PROTIME-INR
INR: 1.1 (ref 0.8–1.2)
Prothrombin Time: 14 s (ref 11.4–15.2)

## 2023-06-10 LAB — CREATININE, SERUM
Creatinine, Ser: 0.66 mg/dL (ref 0.44–1.00)
GFR, Estimated: >60 mL/min (ref >60)

## 2023-06-10 LAB — CBG MONITORING, ED: Glucose-Capillary: 152 mg/dL — ABNORMAL HIGH (ref 70–99)

## 2023-06-10 LAB — APTT: aPTT: 24 s (ref 24–36)

## 2023-06-10 SURGERY — IR WITH ANESTHESIA
Anesthesia: General

## 2023-06-10 MED ORDER — LABETALOL HCL 5 MG/ML IV SOLN
INTRAVENOUS | Status: DC | PRN
  Administered 2023-06-10: 5 mg via INTRAVENOUS

## 2023-06-10 MED ORDER — STROKE: EARLY STAGES OF RECOVERY BOOK: Freq: Once | Status: AC

## 2023-06-10 MED ORDER — PHENYLEPHRINE 80 MCG/ML (10ML) SYRINGE FOR IV PUSH (FOR BLOOD PRESSURE SUPPORT)
PREFILLED_SYRINGE | INTRAVENOUS | Status: DC | PRN
  Administered 2023-06-10 (×2): 120 ug via INTRAVENOUS
  Administered 2023-06-10: 240 ug via INTRAVENOUS
  Administered 2023-06-10: 160 ug via INTRAVENOUS
  Administered 2023-06-10 (×3): 80 ug via INTRAVENOUS
  Administered 2023-06-10: 160 ug via INTRAVENOUS

## 2023-06-10 MED ORDER — ACETAMINOPHEN 650 MG RE SUPP: 650.0000 mg | RECTAL | Status: DC | PRN

## 2023-06-10 MED ORDER — ORAL CARE MOUTH RINSE: 15.0000 mL | OROMUCOSAL | Status: DC | PRN

## 2023-06-10 MED ORDER — SODIUM CHLORIDE 0.9 % IV SOLN: INTRAVENOUS | Status: DC

## 2023-06-10 MED ORDER — PHENYLEPHRINE HCL-NACL 20-0.9 MG/250ML-% IV SOLN
25.0000 ug/min | INTRAVENOUS | Status: DC
  Administered 2023-06-10: 25 ug/min via INTRAVENOUS
  Filled 2023-06-10 (×2): qty 250

## 2023-06-10 MED ORDER — PROPOFOL 10 MG/ML IV BOLUS
INTRAVENOUS | Status: DC | PRN
  Administered 2023-06-10: 110 mg via INTRAVENOUS

## 2023-06-10 MED ORDER — CLEVIDIPINE BUTYRATE 0.5 MG/ML IV EMUL
0.0000 mg/h | INTRAVENOUS | Status: DC
  Administered 2023-06-10: 2 mg/h via INTRAVENOUS
  Filled 2023-06-10: qty 50

## 2023-06-10 MED ORDER — ROCURONIUM BROMIDE 10 MG/ML (PF) SYRINGE
PREFILLED_SYRINGE | INTRAVENOUS | Status: DC | PRN
  Administered 2023-06-10: 30 mg via INTRAVENOUS

## 2023-06-10 MED ORDER — ACETAMINOPHEN 325 MG PO TABS
650.0000 mg | ORAL_TABLET | ORAL | Status: DC | PRN
  Filled 2023-06-10: qty 2

## 2023-06-10 MED ORDER — ENOXAPARIN SODIUM 40 MG/0.4ML IJ SOSY
40.0000 mg | PREFILLED_SYRINGE | INTRAMUSCULAR | Status: DC
  Administered 2023-06-10 – 2023-06-11 (×2): 40 mg via SUBCUTANEOUS
  Filled 2023-06-10 (×2): qty 0.4

## 2023-06-10 MED ORDER — ACETAMINOPHEN 160 MG/5ML PO SOLN: 650.0000 mg | ORAL | Status: DC | PRN

## 2023-06-10 MED ORDER — SODIUM CHLORIDE 0.9 % IV SOLN: 250.0000 mL | INTRAVENOUS | Status: DC

## 2023-06-10 MED ORDER — SODIUM CHLORIDE 0.9% FLUSH: 3.0000 mL | Freq: Once | INTRAVENOUS | Status: DC

## 2023-06-10 MED ORDER — NITROGLYCERIN 1 MG/10 ML FOR IR/CATH LAB
INTRA_ARTERIAL | Status: AC
  Filled 2023-06-10: qty 10

## 2023-06-10 MED ORDER — FENTANYL CITRATE (PF) 100 MCG/2ML IJ SOLN
INTRAMUSCULAR | Status: AC
  Filled 2023-06-10: qty 2

## 2023-06-10 MED ORDER — IOHEXOL 350 MG/ML SOLN
150.0000 mL | Freq: Once | INTRAVENOUS | Status: AC | PRN
  Administered 2023-06-10: 85 mL via INTRA_ARTERIAL

## 2023-06-10 MED ORDER — CHLORHEXIDINE GLUCONATE CLOTH 2 % EX PADS
6.0000 | MEDICATED_PAD | Freq: Every day | CUTANEOUS | Status: DC
  Administered 2023-06-10 – 2023-06-13 (×3): 6 via TOPICAL

## 2023-06-10 MED ORDER — SODIUM CHLORIDE 0.9 % IV SOLN
INTRAVENOUS | Status: DC
Start: 2023-06-10 — End: 2023-06-11

## 2023-06-10 MED ORDER — IOHEXOL 350 MG/ML SOLN
100.0000 mL | Freq: Once | INTRAVENOUS | Status: AC | PRN
  Administered 2023-06-10: 100 mL via INTRAVENOUS

## 2023-06-10 MED ORDER — FENTANYL CITRATE (PF) 250 MCG/5ML IJ SOLN
INTRAMUSCULAR | Status: DC | PRN
  Administered 2023-06-10: 100 ug via INTRAVENOUS

## 2023-06-10 MED ORDER — CEFAZOLIN SODIUM-DEXTROSE 2-3 GM-%(50ML) IV SOLR: INTRAVENOUS | Status: DC | PRN

## 2023-06-10 MED ORDER — CEFAZOLIN SODIUM-DEXTROSE 2-4 GM/100ML-% IV SOLN
INTRAVENOUS | Status: AC
  Filled 2023-06-10: qty 100

## 2023-06-10 MED ORDER — SUGAMMADEX SODIUM 200 MG/2ML IV SOLN
INTRAVENOUS | Status: DC | PRN
  Administered 2023-06-10: 200 mg via INTRAVENOUS

## 2023-06-10 MED ORDER — ONDANSETRON HCL 4 MG/2ML IJ SOLN
INTRAMUSCULAR | Status: DC | PRN
  Administered 2023-06-10: 4 mg via INTRAVENOUS

## 2023-06-10 MED ORDER — DEXAMETHASONE SODIUM PHOSPHATE 10 MG/ML IJ SOLN
INTRAMUSCULAR | Status: DC | PRN
  Administered 2023-06-10: 5 mg via INTRAVENOUS

## 2023-06-10 MED ORDER — PHENYLEPHRINE HCL-NACL 20-0.9 MG/250ML-% IV SOLN
INTRAVENOUS | Status: DC | PRN
  Administered 2023-06-10: 25 ug/min via INTRAVENOUS
  Administered 2023-06-10: 35 ug/min via INTRAVENOUS

## 2023-06-10 MED ORDER — SODIUM CHLORIDE (PF) 0.9 % IJ SOLN
INTRAVENOUS | Status: AC | PRN
  Administered 2023-06-10 (×3): 25 ug via INTRA_ARTERIAL

## 2023-06-10 MED ORDER — ACETAMINOPHEN 325 MG PO TABS: 650.0000 mg | ORAL_TABLET | ORAL | Status: DC | PRN

## 2023-06-10 MED ORDER — CEFAZOLIN SODIUM-DEXTROSE 2-3 GM-%(50ML) IV SOLR
INTRAVENOUS | Status: DC | PRN
  Administered 2023-06-10: 2 g via INTRAVENOUS

## 2023-06-10 MED ORDER — LACTATED RINGERS IV SOLN: INTRAVENOUS | Status: DC | PRN

## 2023-06-10 MED ORDER — SUCCINYLCHOLINE CHLORIDE 200 MG/10ML IV SOSY
PREFILLED_SYRINGE | INTRAVENOUS | Status: DC | PRN
  Administered 2023-06-10: 100 mg via INTRAVENOUS

## 2023-06-10 MED ORDER — SENNOSIDES-DOCUSATE SODIUM 8.6-50 MG PO TABS: 1.0000 | ORAL_TABLET | Freq: Every evening | ORAL | Status: DC | PRN

## 2023-06-10 NOTE — H&P (Signed)
NEUROLOGY H&P NOTE   Date of service: June 10, 2023 Patient Name: Sara Donaldson MRN:  [id number] DOB:  02-12-1930 Chief Complaint: "CODE STROKE"  History of Present Illness  NAJMA WELLMAN is a 89 y.o. female with PMH significant for aortic stenosis, aortic valve repair, tachycardia, anxiety, previous concerns for Afib but no diagnosis presents as a CODE STROKE after being found down by daughter this am. Noted to have dysarthria and word-salad by EMS.  On exam, patient has a right gaze preference but does cross midline, nonverbal, aphasic, RUE weakness, decreased sensation. CT negative for hemorrhage. CTA with perfusion shows left MCA M1 occlusion with 7ml infarct, 87ml estimated penumbra.   Extensive discussion on the phone and in-person with daughter, Martyn Ehrich, to thoroughly discuss risks and benefits of IR procedure. Consent witnessed by Dr. Derry Lory and Dr. Corliss Skains. Daughter states that patient lives alone, is able to handle all needs independently but does sometimes use a cane. Daughter last spoke to her on the phone at 2000 lest night.   Last known well: 2000 11/23 Modified rankin score: 1-No significant post stroke disability and can perform usual duties with stroke symptoms IV Thrombolysis: not offered, (outside of window) Thrombectomy: Yes, discussed disabling deficits with daughter including noted global aphasia and mute and loss of vision in R half. Both me and Dr. Corliss Skains discussed risks and benefits of thrombectomy with patient's daughter. This could not be discussed with patient due to aphasia. Discussed 10% risk of ICH that can worsen her symptoms and could result in death. Also discussed that if she has underlying atherosclerosis/cholesterol deposit, she may require a stent which carries additional risk of hemorrhage. Daughter is certainly distraught and not decisive but after detailed discussion of risks and benefits again, opted to proceed with thrombectomy.  NIHSS  components Score: Comment  1a Level of Conscious 0[]  1[x]  2[]  3[]      1b LOC Questions 0[]  1[]  2[x]       1c LOC Commands 0[]  1[]  2[x]       2 Best Gaze 0[]  1[x]  2[]       3 Visual 0[]  1[]  2[x]  3[]      4 Facial Palsy 0[x]  1[]  2[]  3[]      5a Motor Arm - left 0[x]  1[]  2[]  3[]  4[]  UN[]    5b Motor Arm - Right 0[]  1[]  2[x]  3[]  4[]  UN[]    6a Motor Leg - Left 0[x]  1[]  2[]  3[]  4[]  UN[]    6b Motor Leg - Right 0[x]  1[]  2[]  3[]  4[]  UN[]    7 Limb Ataxia 0[x]  1[]  2[]  3[]  UN[]     8 Sensory 0[x]  1[]  2[x]  UN[]      9 Best Language 0[]  1[]  2[]  3[x]      10 Dysarthria 0[x]  1[]  2[x]  UN[]      11 Extinct. and Inattention 0[x]  1[]  2[]       TOTAL:    17      ROS   Unable to perform due to AMS  Past History   Past Medical History:  Diagnosis Date   Aortic stenosis    Bronchitis    Heart murmur    Hypercholesterolemia    Hypertension    Thrombocytopenia (HCC) 03/05/2014   Past Surgical History:  Procedure Laterality Date   AORTIC VALVE REPLACEMENT N/A 03/02/2014   Procedure: AORTIC VALVE REPLACEMENT (AVR);  Surgeon: Loreli Slot, MD;  Location: [facility] OR;  Service: Open Heart Surgery;  Laterality: N/A;   CARDIAC CATHETERIZATION     4/10 2015   EYE SURGERY  LEFT HEART CATHETERIZATION WITH CORONARY ANGIOGRAM N/A 10/24/2013   Procedure: LEFT HEART CATHETERIZATION WITH CORONARY ANGIOGRAM;  Surgeon: Ricki Rodriguez, MD;  Location: [facility] CATH LAB;  Service: Cardiovascular;  Laterality: N/A;   TEE WITHOUT CARDIOVERSION N/A 01/12/2014   Procedure: TRANSESOPHAGEAL ECHOCARDIOGRAM (TEE);  Surgeon: Ricki Rodriguez, MD;  Location: [facility] ENDOSCOPY;  Service: Cardiovascular;  Laterality: N/A;   Family History  Problem Relation Age of Onset   Heart disease Mother    Heart disease Father    Hypertension Other    Social History   Socioeconomic History   Marital status: Widowed    Spouse name: Not on file   Number of children: Not on file   Years of education: Not on file   Highest education level: Not on file   Occupational History   Not on file  Tobacco Use   Smoking status: Never   Smokeless tobacco: Never  Substance and Sexual Activity   Alcohol use: No   Drug use: No   Sexual activity: Not on file  Other Topics Concern   Not on file  Social History Narrative   Not on file   Social Determinants of Health   Financial Resource Strain: Not on file  Food Insecurity: Not on file  Transportation Needs: Not on file  Physical Activity: Not on file  Stress: Not on file  Social Connections: Not on file   Allergies  Allergen Reactions   Tape Other (See Comments)    Adhesive tape causes blisters; use paper tape only   Trazodone And Nefazodone Hives    Medications   Current Facility-Administered Medications:    sodium chloride flush (NS) 0.9 % injection 3 mL, 3 mL, Intravenous, Once, Linwood Dibbles, MD  Current Outpatient Medications:    ALPRAZolam (XANAX) 0.25 MG tablet, Take 0.25 mg by mouth at bedtime., Disp: , Rfl:    amLODipine (NORVASC) 5 MG tablet, Take 5 mg by mouth daily., Disp: , Rfl:    aspirin EC 81 MG tablet, Take 81 mg by mouth daily at 12 noon. , Disp: , Rfl:    Calcium Carb-Cholecalciferol (CALCIUM 600+D3) 600-800 MG-UNIT TABS, Take 1 tablet by mouth 2 (two) times daily., Disp: , Rfl:    Carboxymeth-Glycerin-Polysorb (REFRESH OPTIVE ADVANCED) 0.5-1-0.5 % SOLN, Place 1 drop into both eyes 2 (two) times daily., Disp: , Rfl:    furosemide (LASIX) 20 MG tablet, Take 20 mg by mouth daily., Disp: , Rfl:    lisinopril (PRINIVIL,ZESTRIL) 5 MG tablet, Take 1 tablet (5 mg total) by mouth daily., Disp: 30 tablet, Rfl: 3   lovastatin (MEVACOR) 20 MG tablet, Take 20 mg by mouth 2 (two) times daily., Disp: , Rfl:    metoprolol tartrate (LOPRESSOR) 25 MG tablet, Take 1 tablet (25 mg total) by mouth 2 (two) times daily., Disp: 60 tablet, Rfl: 3   Multiple Vitamin (MULTIVITAMIN WITH MINERALS) TABS tablet, Take 1 tablet by mouth every evening. 4pm (Centrum Silver), Disp: , Rfl:    niacin 500  MG tablet, Take 500 mg by mouth 2 (two) times daily., Disp: , Rfl:    omeprazole (PRILOSEC) 20 MG capsule, Take 20 mg by mouth daily., Disp: , Rfl:    potassium chloride (K-DUR) 10 MEQ tablet, Take 10 mEq by mouth 2 (two) times daily., Disp: , Rfl:    vitamin E 400 UNIT capsule, Take 400 Units by mouth every evening. 4pm, Disp: , Rfl:    zolpidem (AMBIEN) 5 MG tablet, Take 5 mg by mouth at bedtime.,  Disp: , Rfl:    Vitals   Vitals:   06/10/23 1204 06/10/23 1205 06/10/23 1230  BP:  (!) 160/102 (!) 149/115  Pulse:  (!) 103 (!) 104  Resp:  15 15  Temp:   98 F (36.7 C)  TempSrc:   Axillary  SpO2:  99% 100%  Weight: 65.6 kg       Body mass index is 27.33 kg/m.  Physical Exam   Constitutional: elderly-appearing HHENT: Joseph/AT, Emesis present Cardiovascular: Normal rate and regular rhythm.  Respiratory: Effort normal, non-labored breathing.   Neurologic Examination   Neuro: Mental Status: Patient is drowsy but opens eyes to voice, aphasic, nonverbal, does not follow commands but will seem to follow visual cues.  Cranial Nerves: II: Does not blink to threat on right. III,IV, VI: Left gaze preference, but does cross midline.  V: UTA facial sensation VII: no facial droop appreciated, but difficult to fully evaluate  VIII: hearing is intact to voice Motor: RUE: 4-/5 with drift.  No drift seen in other extremities.  Sensory: Does not withdraw to pain Cerebellar: UTA, no clear ataxia noted.   Labs   CBC:  Recent Labs  Lab 06/10/23 1210 06/10/23 1219  WBC  --  14.0*  NEUTROABS  --  12.0*  HGB 17.0* 15.4*  HCT 50.0* 47.3*  MCV  --  89.2  PLT  --  187   Basic Metabolic Panel:  Lab Results  Component Value Date   NA 140 06/10/2023   K 3.8 06/10/2023   CO2 25 12/07/2022   GLUCOSE 154 (H) 06/10/2023   BUN 15 06/10/2023   CREATININE 0.60 06/10/2023   CALCIUM 9.4 12/07/2022   GFRNONAA >60 12/07/2022   GFRAA >60 06/08/2018   Lipid Panel: No results found for:  "LDLCALC" HgbA1c:  Lab Results  Component Value Date   HGBA1C 5.3 02/26/2014   Urine Drug Screen: No results found for: "LABOPIA", "COCAINSCRNUR", "LABBENZ", "AMPHETMU", "THCU", "LABBARB"  Alcohol Level No results found for: "ETH" INR  Lab Results  Component Value Date   INR 1.1 06/10/2023   APTT  Lab Results  Component Value Date   APTT 24 06/10/2023    CT Head without contrast(Personally reviewed): Hyperdense Left MCA and evidence of Left basal ganglia cytotoxic edema.  ASPECTS 7.  No acute hemorrhage identified  CT angio Head and Neck with contrast w/perfusion(Personally reviewed): Positive for Left MCA M1 Occlusion  CTP estimates a 7 mL infarct, 87 mL estimated penumbra  MRI Brain: PENDING  Assessment   JAYLYNNE LAMOND is a 89 y.o. female with PMH significant for aortic stenosis, aortic valve repair, tachycardia, anxiety, previous concerns for Afib but no diagnosis, presents as a CODE STROKE after being found down. Exam shows right gaze preference but does cross midline, nonverbal, intermittently follows simple commands, RUE weakness.CT negative for hemorrhage. CTA with perfusion shows left MCA M1 occlusion with 7ml infarct, 87ml estimated penumbra. Not a candidate for TNK due to LKW being outside of window.Taken emergently to IR for mechanical thrombectomy after detailed discussion of risks and benefits with daughter as noted above.   Primary Diagnosis:  Cerebral infarction due to occlusion or stenosis of left middle cerebral artery.   Secondary Diagnosis: Essential (primary) hypertension, Hypercholesterolemia, Advanced Age  Recommendations  - Frequent Neuro checks per stroke unit protocol - MRI Brain routine - Vascular imaging - CTA completed - Recommend TTE - Recommend Lipid panel Statin - will be started if LDL>70 or otherwise medically indicated - Recommend A1C -  Antithrombotic - per IR for first 24 hours post-procedure - DVT ppx - SCDs, hold prophylaxis for  first 24 hours post-procedure - Smoking cessation - will counsel patient - SBP goal - per IR for first 24 hours post-procedure - Recommend Telemetry monitoring for arrhythmia - 72h - Recommend Swallow screen - will be performed prior to PO intake - Stroke education - will be given - PT/OT/SLP - Dispo: Admit to ICU post-IR procedure  _______________________________________________________________   Pt seen by Neuro NP/APP and later by MD. Note/plan to be edited by MD as needed.    Lynnae January, DNP, AGACNP-BC [facility] Please use AMION for contact information & EPIC for messaging.  NEUROHOSPITALIST ADDENDUM Performed a face to face diagnostic evaluation.   I have reviewed the contents of history and physical exam as documented by PA/ARNP/Resident and agree with above documentation.  I have discussed and formulated the above plan as documented. Edits to the note have been made as needed.   I personally spoke with daughter Martyn Ehrich and with Dr. Kerby Nora and examined patient and had discussion with daughter about risks and benefits, details of thrombectomy and alternatives. Dr. Corliss Skains also spoke with the patient's daughter about this and I was present for the entirety of the conversation.  This patient is critically ill and at significant risk of neurological worsening, death and care requires constant monitoring of vital signs, hemodynamics,respiratory and cardiac monitoring, neurological assessment, discussion with family, other specialists and medical decision making of high complexity. I spent 40 minutes of neurocritical care time  in the care of  this patient. This was time spent independent of any time provided by nurse practitioner or PA.  Erick Blinks [facility] 06/10/2023  2:06 PM   Erick Blinks, MD [facility] [phone]   If 7pm to 7am, please call on call as listed on AMION.

## 2023-06-10 NOTE — Progress Notes (Signed)
PT Cancellation Note  Patient Details Name: Sara Donaldson MRN: [id number] DOB: 03/27/1930   Cancelled Treatment:    Reason Eval/Treat Not Completed: (P) Active bedrest order until 8:15 pm s/p IR. Will plan to follow-up tomorrow as able.   Virgil Benedict, PT, DPT Acute Rehabilitation Services  Office: [phone]    Bettina Gavia 06/10/2023, 3:55 PM

## 2023-06-10 NOTE — ED Provider Notes (Signed)
[place] EMERGENCY DEPARTMENT AT [facility] Provider Note   CSN: [id number] Arrival date & time: 06/10/23  1159  An emergency department physician performed an initial assessment on this suspected stroke patient at 64.  History  Chief Complaint  Patient presents with   Code Stroke    DETTE BRIGNAC is a 89 y.o. female.  HPI   Patient has a history of hypertension aortic stenosis hypercholesterolemia thrombocytopenia.  She presents to the ED for evaluation of aphasia altered mental status.  Patient lives at home.  Her daughter spoke to her last evening at about 8 PM.  Patient seemed fine.  This morning she was found unresponsive on the floor.  Patient was activated as a code stroke by EMS  Home Medications Prior to Admission medications   Medication Sig Start Date End Date Taking? Authorizing Provider  ALPRAZolam (XANAX) 0.25 MG tablet Take 0.25 mg by mouth at bedtime.    [provider]  amLODipine (NORVASC) 5 MG tablet Take 5 mg by mouth daily.    [provider]  aspirin EC 81 MG tablet Take 81 mg by mouth daily at 12 noon.     [provider]  Calcium Carb-Cholecalciferol (CALCIUM 600+D3) 600-800 MG-UNIT TABS Take 1 tablet by mouth 2 (two) times daily.    [provider]  Carboxymeth-Glycerin-Polysorb (REFRESH OPTIVE ADVANCED) 0.5-1-0.5 % SOLN Place 1 drop into both eyes 2 (two) times daily.    [provider]  furosemide (LASIX) 20 MG tablet Take 20 mg by mouth daily.    [provider]  lisinopril (PRINIVIL,ZESTRIL) 5 MG tablet Take 1 tablet (5 mg total) by mouth daily. 03/10/14   Barrett, Erin R, PA-C  lovastatin (MEVACOR) 20 MG tablet Take 20 mg by mouth 2 (two) times daily.    [provider]  metoprolol tartrate (LOPRESSOR) 25 MG tablet Take 1 tablet (25 mg total) by mouth 2 (two) times daily. 03/10/14   Barrett, Erin R, PA-C  Multiple Vitamin (MULTIVITAMIN WITH MINERALS) TABS tablet Take 1 tablet by  mouth every evening. 4pm (Centrum Silver)    [provider]  niacin 500 MG tablet Take 500 mg by mouth 2 (two) times daily.    [provider]  omeprazole (PRILOSEC) 20 MG capsule Take 20 mg by mouth daily.    [provider]  potassium chloride (K-DUR) 10 MEQ tablet Take 10 mEq by mouth 2 (two) times daily.    [provider]  vitamin E 400 UNIT capsule Take 400 Units by mouth every evening. 4pm    [provider]  zolpidem (AMBIEN) 5 MG tablet Take 5 mg by mouth at bedtime.    [provider]      Allergies    Tape and Trazodone and nefazodone    Review of Systems   Review of Systems  Physical Exam Updated Vital Signs BP (!) 148/91   Pulse 100   Temp 98 F (36.7 C) (Axillary)   Resp 15   Wt 65.6 kg   SpO2 100%   BMI 27.33 kg/m  Physical Exam Vitals and nursing note reviewed.  Constitutional:      General: She is not in acute distress.    Appearance: She is well-developed.  HENT:     Head: Normocephalic and atraumatic.     Right Ear: External ear normal.     Left Ear: External ear normal.  Eyes:     General: No scleral icterus.  Right eye: No discharge.        Left eye: No discharge.     Conjunctiva/sclera: Conjunctivae normal.  Neck:     Trachea: No tracheal deviation.  Cardiovascular:     Rate and Rhythm: Normal rate.  Pulmonary:     Effort: Pulmonary effort is normal. No respiratory distress.     Breath sounds: No stridor.  Abdominal:     General: There is no distension.  Musculoskeletal:        General: No swelling or deformity.     Cervical back: Neck supple.  Skin:    General: Skin is warm and dry.     Findings: No rash.  Neurological:     Cranial Nerves: Cranial nerve deficit present. No facial asymmetry.     Motor: Weakness present. No seizure activity.     Comments: Aphasic, able to hold arms and legs off the bed after initial assitance     ED Results / Procedures / Treatments    Labs (all labs ordered are listed, but only abnormal results are displayed) Labs Reviewed  CBC - Abnormal; Notable for the following components:      Result Value   WBC 14.0 (*)    RBC 5.30 (*)    Hemoglobin 15.4 (*)    HCT 47.3 (*)    All other components within normal limits  DIFFERENTIAL - Abnormal; Notable for the following components:   Neutro Abs 12.0 (*)    Abs Immature Granulocytes 0.08 (*)    All other components within normal limits  I-STAT CHEM 8, ED - Abnormal; Notable for the following components:   Glucose, Bld 154 (*)    Calcium, Ion 1.13 (*)    Hemoglobin 17.0 (*)    HCT 50.0 (*)    All other components within normal limits  CBG MONITORING, ED - Abnormal; Notable for the following components:   Glucose-Capillary 152 (*)    All other components within normal limits  PROTIME-INR  APTT  COMPREHENSIVE METABOLIC PANEL  ETHANOL    EKG None  Radiology CT ANGIO HEAD NECK W WO CM W PERF (CODE STROKE)  Result Date: 06/10/2023 CLINICAL DATA:  89 year old female code stroke presentation with plain head CT evidence of left MCA ELVO. EXAM: CT ANGIOGRAPHY HEAD AND NECK CT PERFUSION BRAIN TECHNIQUE: Multidetector CT imaging of the head and neck was performed using the standard protocol during bolus administration of intravenous contrast. Multiplanar CT image reconstructions and MIPs were obtained to evaluate the vascular anatomy. Carotid stenosis measurements (when applicable) are obtained utilizing NASCET criteria, using the distal internal carotid diameter as the denominator. Multiphase CT imaging of the brain was performed following IV bolus contrast injection. Subsequent parametric perfusion maps were calculated using RAPID software. RADIATION DOSE REDUCTION: This exam was performed according to the departmental dose-optimization program which includes automated exposure control, adjustment of the mA and/or kV according to patient size and/or use of iterative reconstruction  technique. CONTRAST:  OMNIPAQUE IOHEXOL 350 MG/ML SOLN COMPARISON:  Plain head CT 1212 hours today. FINDINGS: CT Brain Perfusion Findings: ASPECTS: 7 CBF (<30%) Volume: 7mL (up to 13 mL using the least stringent parameters). Perfusion (Tmax>6.0s) volume: 87mL. Hypoperfusion index of 0.4 (34 mL of T-max greater than 10 seconds) Mismatch Volume: 80mL Infarction Location:Left MCA CTA NECK Skeleton: Previous sternotomy. Age-appropriate cervical spine degeneration. No acute osseous abnormality identified. Upper chest: Prior sternotomy. Mild upper lung atelectasis and gas trapping. Other neck: Partially retropharyngeal course of both carotids, otherwise negative for age.  Aortic arch: 3 vessel arch. Mild arch tortuosity. Calcified aortic atherosclerosis. Right carotid system: Brachiocephalic artery is mildly tortuous without stenosis. Mildly tortuous right CCA with a retropharyngeal course. Bulky calcified plaque at the right ICA origin and bulb, up to 50 % stenosis with respect to the distal vessel. Tortuous right ICA distal to the bulb. Left carotid system: Tortuous left CCA with no plaque or stenosis. Retropharyngeal course distally. Mild to moderate calcified plaque at the left ICA origin and bulb with 50 % stenosis with respect to the distal vessel (series 11, image 84). Mildly tortuous left ICA distal to the bulb. Vertebral arteries: Calcified right subclavian artery origin and tortuosity without stenosis. Normal right vertebral artery origin. Tortuous right vertebral artery in the neck with no plaque or stenosis to the skull base. Proximal left subclavian artery mild plaque without stenosis. Normal left vertebral artery origin. Tortuous left vertebral artery in the neck with no plaque or stenosis to the skull base. CTA HEAD Posterior circulation: Mild bilateral V4 segment atherosclerosis with no significant stenosis. Patent vertebrobasilar junction. Normal right PICA origin. Left AICA appears to be dominant.  Patent basilar artery without stenosis. Patent SCA and PCA origins. Small posterior communicating artery(s). Bilateral PCA branches are within normal limits. Anterior circulation: Both ICA siphons are patent. On the left there is mild to moderate calcified plaque primarily from the anterior genu distally. Only mild left siphon stenosis. On the right there is more moderate calcified plaque, and there is moderate to severe right supraclinoid stenosis suspected on series 9, image 103 and series 13, image 64. Patent carotid termini. Patent MCA and ACA origins. Normal anterior communicating artery. Bilateral ACA branches are within normal limits. Right MCA M1 segment and right MCA branches are within normal limits. Left MCA M1 is occluded distal to the temporal artery origins series 7, image 24. Very little early left MCA branch reconstitution. Venous sinuses: Early contrast timing, not evaluated. Anatomic variants: None significant. Review of the MIP images confirms the above findings IMPRESSION: 1. Positive for Left MCA M1 Occlusion. This was communicated to Dr. Derry Lory at 1217 hours via the [facility] messaging system. 2. CTP estimates a 7 mL infarct core concordant with ASPECTS. 87 mL estimated penumbra. Hypoperfusion index of 0.4. 3. Generalized arterial tortuosity in the head and neck. Bilateral carotid atherosclerosis with up to 50% stenosis on the Left. Where as distal Right ICA siphon stenosis appears moderate to severe. No significant posterior circulation stenosis. 4.  Aortic Atherosclerosis (ICD10-I70.0). Electronically Signed   By: Odessa Fleming M.D.   On: 06/10/2023 12:30   CT HEAD CODE STROKE WO CONTRAST  Result Date: 06/10/2023 CLINICAL DATA:  Code stroke.  89 year old female. EXAM: CT HEAD WITHOUT CONTRAST TECHNIQUE: Contiguous axial images were obtained from the base of the skull through the vertex without intravenous contrast. RADIATION DOSE REDUCTION: This exam was performed according to the departmental  dose-optimization program which includes automated exposure control, adjustment of the mA and/or kV according to patient size and/or use of iterative reconstruction technique. COMPARISON:  Head CT 10/30/2013. FINDINGS: Brain: Patchy asymmetric bilateral white matter and deep gray matter hypodensity slightly greater in the left hemisphere. But furthermore, there is suspicious asymmetric loss of left basal ganglia gray-white differentiation on series 2, image 14. Posterior left insula also indistinct. Other left MCA territory gray-white appears preserved. No midline shift, mass effect, or evidence of intracranial mass lesion. No ventriculomegaly. No acute intracranial hemorrhage identified. Vascular: Asymmetric hyperdensity of the left MCA distal M1 on sagittal  image 31. Skull: No acute osseous abnormality identified. Sinuses/Orbits: Paranasal sinuses, middle ears and mastoids remain well aerated, mild. Mild retained secretions in the right ethmoid and maxillary sinuses. Other: Rightward gaze deviation.  Stable scalp soft tissues. ASPECTS [facility] Stroke Program Early CT Score) Total score (0-10 with 10 being normal): 7 IMPRESSION: 1. Hyperdense Left MCA and evidence of Left basal ganglia cytotoxic edema. ASPECTS 7. No acute hemorrhage identified. These results were communicated to Dr. Derry Lory at 12:16 pm on 06/10/2023 by text page via the [facility] messaging system. 2. Underlying bilateral small vessel disease. Electronically Signed   By: Odessa Fleming M.D.   On: 06/10/2023 12:17    Procedures Procedures    Medications Ordered in ED Medications  sodium chloride flush (NS) 0.9 % injection 3 mL (has no administration in time range)  iohexol (OMNIPAQUE) 350 MG/ML injection 100 mL (100 mLs Intravenous Contrast Given 06/10/23 1219)    ED Course/ Medical Decision Making/ A&P                                 Medical Decision Making Amount and/or Complexity of Data Reviewed Labs: ordered. Radiology:  ordered.   Patient presented to ED for evaluation of acute aphasia altered mental status.  Patient's presentation concerning for an acute large vessel occlusion stroke.  Patient outside of tPA window.  She was seen on arrival by neurology team including Dr. Donovan Kail.  Pt noted to have a large vessel occlusion.  Plan on IR intervention, admission to neuro icu        Final Clinical Impression(s) / ED Diagnoses Final diagnoses:  Cerebrovascular accident (CVA), unspecified mechanism [facility])    Rx / DC Orders ED Discharge Orders     None         Linwood Dibbles, MD 06/10/23 1255

## 2023-06-10 NOTE — Transfer of Care (Signed)
Immediate Anesthesia Transfer of Care Note  Patient: Sara Donaldson  Procedure(s) Performed: IR WITH ANESTHESIA  Patient Location: PACU and ICU  Anesthesia Type:General  Level of Consciousness: awake and alert   Airway & Oxygen Therapy: Patient Spontanous Breathing and Patient connected to face mask oxygen  Post-op Assessment: Report given to RN, Post -op Vital signs reviewed and stable, and Patient moving all extremities X 4  Post vital signs: Reviewed and stable  Last Vitals:  Vitals Value Taken Time  BP 135/110 06/10/23 1454  Temp    Pulse 107 06/10/23 1501  Resp 15 06/10/23 1501  SpO2 98 % 06/10/23 1501  Vitals shown include unfiled device data.  Last Pain:  Vitals:   06/10/23 1230  TempSrc: Axillary         Complications: There were no known notable events for this encounter.

## 2023-06-10 NOTE — ED Notes (Signed)
Taken to IR 1235.

## 2023-06-10 NOTE — Code Documentation (Signed)
Stroke Response Nurse Documentation Code Documentation  Sara Donaldson is a 89 y.o. female arriving to [facility]  via [facility] on 06-10-2023 with past medical hx of AVR, anxiety. On aspirin 81 mg daily. Code stroke was activated by EMS.   Patient from home where she was LKW at 2000 11/23 and now complaining of difficulty speaking.  Per EMS her daughter talked to her last night about 8pm and she was her normal self.  This morning she found her on the floor with vomit on her clothes  Stroke team at the bedside on patient arrival. Labs drawn and patient cleared for CT by Dr. Lynelle Doctor. Patient to CT with team. NIHSS 17, see documentation for details and code stroke times. Patient with decreased LOC, disoriented, not following commands, left gaze preference , right hemianopia, right facial droop, right arm weakness, right decreased sensation, Global aphasia , and dysarthria  on exam. The following imaging was completed:  CT Head, CTA, and CTP. Patient is not a candidate for IV Thrombolytic due to last known well yesterday at 8pm. Patient is a candidate for IR.  IR activation at 1225  Care Plan: NIHSS q 2 hours x 12 then q 4 hours.   Bedside handoff with IR RN.  Ashura Montey Hora, [employer]  Stroke Response RN

## 2023-06-10 NOTE — Addendum Note (Signed)
Addendum  created 06/10/23 2036 by Val Eagle, MD   Intraprocedure Meds edited

## 2023-06-10 NOTE — Anesthesia Procedure Notes (Addendum)
Arterial Line Insertion Start/End11/24/2024 1:05 PM, 06/10/2023 1:11 PM Performed by: Val Eagle, MD, Dairl Ponder, CRNA, CRNA  Patient location: OOR procedure area. Preanesthetic checklist: risks and benefits discussed Left, radial was placed Catheter size: 20 G Hand hygiene performed  and maximum sterile barriers used   Attempts: 1 Procedure performed using ultrasound guided technique. Ultrasound Notes:anatomy identified, needle tip was noted to be adjacent to the nerve/plexus identified and no ultrasound evidence of intravascular and/or intraneural injection Following insertion, dressing applied. Post procedure assessment: normal and unchanged

## 2023-06-10 NOTE — Anesthesia Preprocedure Evaluation (Signed)
Anesthesia Evaluation    Reviewed: Unable to perform ROS - Chart review onlyPreop documentation limited or incomplete due to emergent nature of procedure.  Airway        Dental   Pulmonary           Cardiovascular hypertension, + Valvular Problems/Murmurs   - Left ventricle: The cavity size was normal. Systolic function was    normal. Wall motion was normal; there were no regional wall    motion abnormalities. Doppler parameters are consistent with    abnormal left ventricular relaxation (grade 1 diastolic    dysfunction).  - Aortic valve: A bioprosthesis was present. Transvalvular velocity    was minimally increased. There was very mild stenosis. There was    trivial regurgitation.  - Mitral valve: Severely calcified annulus. Mildly thickened    leaflets .  - Tricuspid valve: There was moderate regurgitation.  - Pericardium, extracardiac: A trivial pericardial effusion was    identified.      Neuro/Psych CVA, Residual Symptoms    GI/Hepatic   Endo/Other    Renal/GU      Musculoskeletal   Abdominal   Peds  Hematology Lab Results      Component                Value               Date                      WBC                      14.0 (H)            06/10/2023                HGB                      15.4 (H)            06/10/2023                HCT                      47.3 (H)            06/10/2023                MCV                      89.2                06/10/2023                PLT                      187                 06/10/2023              Anesthesia Other Findings   Reproductive/Obstetrics                              Anesthesia Physical Anesthesia Plan  ASA: 4 and emergent  Anesthesia Plan: General   Post-op Pain Management:    Induction: Intravenous, Rapid sequence and Cricoid pressure planned  PONV Risk Score and Plan: 3 and Treatment may vary due to age or  medical  condition, Ondansetron and Dexamethasone  Airway Management Planned: Oral ETT  Additional Equipment: Arterial line  Intra-op Plan:   Post-operative Plan: Possible Post-op intubation/ventilation  Informed Consent:      Only emergency history available  Plan Discussed with: CRNA  Anesthesia Plan Comments:          Anesthesia Quick Evaluation

## 2023-06-10 NOTE — IPAL (Signed)
   Code status discussed extensively with daughter, Martyn Ehrich, who is listed as Health care power of attorney on patient's living will.   Living will was reviewed by Richardo Priest, NP and Dr. Derry Lory with daughter, Martyn Ehrich. Document was copied and placed in chart.   Patient made DNR per daughter/health care power of attorney, Martyn Ehrich.   06/10/2023 12:31 pm  Pt seen by Neuro NP/APP and MD. Note/plan to be edited by MD as needed.    Lynnae January, DNP, AGACNP-BC [facility] Please use AMION for contact information & EPIC for messaging.

## 2023-06-10 NOTE — Procedures (Signed)
INR.  Status post left common carotid arteriogram.  Right CFA approach.  Findings.  1.  Occluded left middle cerebral artery mid M1 segment.  Status post complete revascularization of the occluded left MCA M1 segment with 1 pass with a 4 x 40 mm solitaire X  retriever device, and contact aspiration achieving a TICI 3 revascularization.  Post CT brain no hemorrhagic complications.  8 French closure device deployed at the right groin puncture site.  Distal pulses  palpable in both feet unchanged.  Patient extubated.    Upon recovery patient following simple commands appropriately.  Pupils 2 mm bilaterally sluggishly reactive.  No facial asymmetry.  Tongue midline.  Patient able to raise her right arm to her shoulder.  Fatima Sanger MD.

## 2023-06-10 NOTE — Plan of Care (Signed)
Discussed current plan of care with patient and daughter at bedside. Daughter is rightfully very anxious and hopeful for a positive recovery. Since thrombectomy, patient's neuro exam has improved. Still flat, per orders. Groin side level 0 with +2 distal pulses. BP within parameters without use of gtt medications.

## 2023-06-10 NOTE — Progress Notes (Addendum)
During bedside report at approximately 1915, pt appeared to have significant increase in aphasia and dysarthria. Pt was only repeating questions instead of answering them. NIH increased from 7 to 10, and on-call neuro/stroke MD paged at 1917. MD paged again at 1937 after no response.  On reassessment just before 2000, NIH score decreased to an 8. Pt's aphasia and dysarthria had improved from initial assessment with dayshift RN. MD paged again after no response. MD instructed to continue to monitor since pt had improved.     2300- around 2300, pt SBP began to drop below goal of 120-140. Dysarthria and aphasia also noticeably worse when SBP was below goal. MD contacted and orders for phenylephrine received.

## 2023-06-10 NOTE — ED Triage Notes (Signed)
Code stroke called. LKW at 2000 last night. Aphasia. Found laying on the floor.

## 2023-06-10 NOTE — Anesthesia Postprocedure Evaluation (Signed)
Anesthesia Post Note  Patient: Sara Donaldson  Procedure(s) Performed: IR WITH ANESTHESIA     Patient location during evaluation: Nursing Unit Anesthesia Type: General Level of consciousness: awake and patient cooperative Pain management: pain level controlled Vital Signs Assessment: post-procedure vital signs reviewed and stable Respiratory status: spontaneous breathing, nonlabored ventilation and respiratory function stable Cardiovascular status: blood pressure returned to baseline and stable Postop Assessment: no apparent nausea or vomiting Anesthetic complications: no   There were no known notable events for this encounter.  Last Vitals:  Vitals:   06/10/23 1515 06/10/23 1530  BP: (!) 124/99   Pulse: (!) 107 96  Resp: (!) 27 14  Temp:    SpO2: 99% 99%    Last Pain:  Vitals:   06/10/23 1230  TempSrc: Axillary                 Everado Pillsbury

## 2023-06-10 NOTE — Progress Notes (Signed)
Patient with soft BP despite fluids, appears to have neuroloigcal worsening when BPs drop, will use phenylephrine to maintain SBP > 120.   Ritta Slot, MD [facility] [phone]  If 7pm- 7am, please page neurology on call as listed in AMION.

## 2023-06-10 NOTE — Anesthesia Procedure Notes (Signed)
Procedure Name: Intubation Date/Time: 06/10/2023 1:00 PM  Performed by: Colbert Coyer, CRNAPre-anesthesia Checklist: Patient identified, Emergency Drugs available, Suction available and Patient being monitored Patient Re-evaluated:Patient Re-evaluated prior to induction Oxygen Delivery Method: Circle System Utilized Preoxygenation: Pre-oxygenation with 100% oxygen Induction Type: IV induction, Rapid sequence and Cricoid Pressure applied Laryngoscope Size: Mac and 4 Grade View: Grade I Tube type: Oral Tube size: 7.0 mm Number of attempts: 1 Airway Equipment and Method: Stylet Placement Confirmation: ETT inserted through vocal cords under direct vision, positive ETCO2 and breath sounds checked- equal and bilateral Secured at: 21 cm Tube secured with: Tape Dental Injury: Teeth and Oropharynx as per pre-operative assessment

## 2023-06-10 NOTE — Anesthesia Procedure Notes (Deleted)
Arterial Line Insertion Start/End11/24/2024 1:31 PM Performed by: Dairl Ponder, CRNA  Left, radial was placed  Attempts: 1 Procedure performed using ultrasound guided technique.

## 2023-06-10 NOTE — Progress Notes (Signed)
SLP Cancellation Note  Patient Details Name: DANNELL DERYKE MRN: [id number] DOB: 02/27/1930   Cancelled treatment:       Reason Eval/Treat Not Completed: Patient at procedure or test/unavailable   Maxum Cassarino Meryl 06/10/2023, 2:38 PM

## 2023-06-11 ENCOUNTER — Inpatient Hospital Stay (HOSPITAL_COMMUNITY): Payer: Medicare Other

## 2023-06-11 ENCOUNTER — Other Ambulatory Visit: Payer: Self-pay

## 2023-06-11 ENCOUNTER — Encounter (HOSPITAL_COMMUNITY): Payer: Self-pay | Admitting: Radiology

## 2023-06-11 DIAGNOSIS — I63512 Cerebral infarction due to unspecified occlusion or stenosis of left middle cerebral artery: Secondary | ICD-10-CM | POA: Diagnosis not present

## 2023-06-11 DIAGNOSIS — I639 Cerebral infarction, unspecified: Secondary | ICD-10-CM

## 2023-06-11 LAB — BASIC METABOLIC PANEL
Anion gap: 8 (ref 5–15)
BUN: 9 mg/dL (ref 8–23)
CO2: 22 mmol/L (ref 22–32)
Calcium: 8.5 mg/dL — ABNORMAL LOW (ref 8.9–10.3)
Chloride: 105 mmol/L (ref 98–111)
Creatinine, Ser: 0.59 mg/dL (ref 0.44–1.00)
GFR, Estimated: >60 mL/min (ref >60)
Glucose, Bld: 112 mg/dL — ABNORMAL HIGH (ref 70–99)
Potassium: 3.7 mmol/L (ref 3.5–5.1)
Sodium: 135 mmol/L (ref 135–145)

## 2023-06-11 LAB — CBC WITH DIFFERENTIAL/PLATELET
Abs Immature Granulocytes: 0.07 10*3/uL (ref 0.00–0.07)
Basophils Absolute: 0 10*3/uL (ref 0.0–0.1)
Basophils Relative: 0 %
Eosinophils Absolute: 0 10*3/uL (ref 0.0–0.5)
Eosinophils Relative: 0 %
HCT: 40.8 % (ref 36.0–46.0)
Hemoglobin: 13 g/dL (ref 12.0–15.0)
Immature Granulocytes: 1 %
Lymphocytes Relative: 8 %
Lymphs Abs: 1 10*3/uL (ref 0.7–4.0)
MCH: 28.6 pg (ref 26.0–34.0)
MCHC: 31.9 g/dL (ref 30.0–36.0)
MCV: 89.9 fL (ref 80.0–100.0)
Monocytes Absolute: 0.9 10*3/uL (ref 0.1–1.0)
Monocytes Relative: 8 %
Neutro Abs: 10.2 10*3/uL — ABNORMAL HIGH (ref 1.7–7.7)
Neutrophils Relative %: 83 %
Platelets: 187 10*3/uL (ref 150–400)
RBC: 4.54 MIL/uL (ref 3.87–5.11)
RDW: 13.2 % (ref 11.5–15.5)
WBC: 12.2 10*3/uL — ABNORMAL HIGH (ref 4.0–10.5)
nRBC: 0 % (ref 0.0–0.2)

## 2023-06-11 LAB — LIPID PANEL
Cholesterol: 117 mg/dL (ref 0–200)
HDL: 52 mg/dL (ref >40)
LDL Cholesterol: 50 mg/dL (ref 0–99)
Total CHOL/HDL Ratio: 2.3 {ratio}
Triglycerides: 75 mg/dL (ref <150)
VLDL: 15 mg/dL (ref 0–40)

## 2023-06-11 MED ORDER — ASPIRIN 81 MG PO TBEC
81.0000 mg | DELAYED_RELEASE_TABLET | Freq: Every day | ORAL | Status: DC
  Administered 2023-06-11: 81 mg via ORAL
  Filled 2023-06-11: qty 1

## 2023-06-11 MED ORDER — ASPIRIN 325 MG PO TBEC
325.0000 mg | DELAYED_RELEASE_TABLET | Freq: Every day | ORAL | Status: DC
  Administered 2023-06-12: 325 mg via ORAL
  Filled 2023-06-11: qty 1

## 2023-06-11 MED ORDER — ASPIRIN 300 MG RE SUPP: 300.0000 mg | Freq: Every day | RECTAL | Status: DC

## 2023-06-11 MED ORDER — METOPROLOL TARTRATE 12.5 MG HALF TABLET
12.5000 mg | ORAL_TABLET | Freq: Two times a day (BID) | ORAL | Status: DC
  Administered 2023-06-11 – 2023-06-12 (×3): 12.5 mg via ORAL
  Filled 2023-06-11 (×3): qty 1

## 2023-06-11 MED ORDER — PRAVASTATIN SODIUM 40 MG PO TABS
40.0000 mg | ORAL_TABLET | Freq: Every day | ORAL | Status: DC
  Administered 2023-06-12: 40 mg via ORAL
  Filled 2023-06-11: qty 1

## 2023-06-11 NOTE — Progress Notes (Signed)
? ?  Inpatient Rehab Admissions Coordinator : ? ?Per therapy recommendations, patient was screened for CIR candidacy by Ottie Glazier RN MSN.  At this time patient appears to be a potential candidate for CIR. I will place a rehab consult per protocol for full assessment. Please call me with any questions. ? ?Ottie Glazier RN MSN ?Admissions Coordinator ?[phone] ?  ?

## 2023-06-11 NOTE — Evaluation (Signed)
Physical Therapy Evaluation Patient Details Name: Sara Donaldson MRN: [id number] DOB: 02/23/1930 Today's Date: 06/11/2023  History of Present Illness  89 y.o. female s/p L MCA stroke.  complete revascularization 06/10/23. PMHx CAD, AV replacement in 02/2014, HLD and obesity.  Clinical Impression  Patient presents with decreased mobility due to decreased attention and awareness, decreased balance, decreased activity tolerance and decreased strength.  She was previously independent, living alone and completing all IADL's.  Currently min A for in room ambulation and to bathroom with HHA and HR up to 130's and noted R inattention.  Patient will benefit from skilled PT in the acute setting and from post-acute inpatient rehabilitation (>3 hours/day) prior to d/c home with family support.         If plan is discharge home, recommend the following: A little help with walking and/or transfers;A little help with bathing/dressing/bathroom;Assistance with feeding;Help with stairs or ramp for entrance;Assist for transportation;Assistance with cooking/housework   Can travel by private vehicle        Equipment Recommendations Other (comment) (TBA)  Recommendations for Other Services  Rehab consult    Functional Status Assessment Patient has had a recent decline in their functional status and demonstrates the ability to make significant improvements in function in a reasonable and predictable amount of time.     Precautions / Restrictions Precautions Precautions: Fall Precaution Comments: R inattention Restrictions Weight Bearing Restrictions: No      Mobility  Bed Mobility Overal bed mobility: Needs Assistance Bed Mobility: Supine to Sit     Supine to sit: Contact guard, HOB elevated, Used rails     General bed mobility comments: VC to initiate task. Increased time required to complete.    Transfers Overall transfer level: Needs assistance Equipment used: 1 person hand held  assist Transfers: Sit to/from Stand, Bed to chair/wheelchair/BSC Sit to Stand: Min assist   Step pivot transfers: Min assist       General transfer comment: VC required for sequencing and direction due to R inattention and decreased balance.    Ambulation/Gait Ambulation/Gait assistance: Min assist Gait Distance (Feet): 35 Feet (&10') Assistive device: 1 person hand held assist Gait Pattern/deviations: Step-through pattern, Decreased stride length, Trunk flexed       General Gait Details: flexed posture assist for balance and safety with lines; walked to bathroom, then walked to door of room and back around bed to recliner  Stairs            Wheelchair Mobility     Tilt Bed    Modified Rankin (Stroke Patients Only)       Balance Overall balance assessment: Needs assistance Sitting-balance support: Feet supported Sitting balance-Leahy Scale: Fair Sitting balance - Comments: sitting EOB and on toilet in bathroom   Standing balance support: Single extremity supported, During functional activity Standing balance-Leahy Scale: Poor Standing balance comment: Required at least 1 hand held assist to maintain balance. Pt also reaching for footboard of bed when walking by for additional support.                             Pertinent Vitals/Pain Pain Assessment Pain Assessment: No/denies pain    Home Living Family/patient expects to be discharged to:: Private residence Living Arrangements: Alone Available Help at Discharge: Family;Available 24 hours/day (daughter able to stay at d/c and provide assist) Type of Home: House Home Access: Stairs to enter   Entrance Stairs-Number of Steps: 1   Home  Layout: One level Home Equipment: None Additional Comments: wears glasses all the time    Prior Function Prior Level of Function : Independent/Modified Independent;Driving               ADLs Comments: Pt completed all BADL and IADL tasks without  assistance.     Extremity/Trunk Assessment   Upper Extremity Assessment Upper Extremity Assessment: Defer to OT evaluation RUE Deficits / Details: A/ROM [facility]. overall strength: 4/5. RUE Sensation:  (NT) RUE Coordination: decreased gross motor;decreased fine motor    Lower Extremity Assessment Lower Extremity Assessment: Generalized weakness    Cervical / Trunk Assessment Cervical / Trunk Assessment: Kyphotic  Communication   Communication Communication: Difficulty communicating thoughts/reduced clarity of speech Cueing Techniques: Verbal cues  Cognition Arousal: Alert Behavior During Therapy: WFL for tasks assessed/performed Overall Cognitive Status: Impaired/Different from baseline Area of Impairment: Attention, Memory, Following commands, Safety/judgement, Awareness, Problem solving                   Current Attention Level: Focused Memory: Decreased short-term memory Following Commands: Follows one step commands inconsistently, Follows one step commands with increased time Safety/Judgement: Decreased awareness of safety, Decreased awareness of deficits   Problem Solving: Slow processing, Decreased initiation, Difficulty sequencing, Requires verbal cues General Comments: easily distracted when multiple family members in room.        General Comments General comments (skin integrity, edema, etc.): BP monitoring throughout session 135/72 in supine, 115/70 sitting and 122/77 in recliner after ambulation, SpO2 91% on RA (O2 removed) and HR max 133bpm during ambulation    Exercises     Assessment/Plan    PT Assessment Patient needs continued PT services  PT Problem List Decreased balance;Decreased activity tolerance;Impaired sensation;Decreased cognition;Decreased mobility;Decreased knowledge of use of DME       PT Treatment Interventions DME instruction;Therapeutic activities;Cognitive remediation;Gait training;Therapeutic exercise;Patient/family  education;Balance training;Neuromuscular re-education;Functional mobility training    PT Goals (Current goals can be found in the Care Plan section)  Acute Rehab PT Goals Patient Stated Goal: return home after rehab PT Goal Formulation: With patient/family Time For Goal Achievement: 06/25/23 Potential to Achieve Goals: Good    Frequency Min 1X/week     Co-evaluation PT/OT/SLP Co-Evaluation/Treatment: Yes Reason for Co-Treatment: To address functional/ADL transfers PT goals addressed during session: Mobility/safety with mobility;Balance OT goals addressed during session: ADL's and self-care;Strengthening/ROM       AM-PAC PT "6 Clicks" Mobility  Outcome Measure Help needed turning from your back to your side while in a flat bed without using bedrails?: A Little Help needed moving from lying on your back to sitting on the side of a flat bed without using bedrails?: A Little Help needed moving to and from a bed to a chair (including a wheelchair)?: A Little Help needed standing up from a chair using your arms (e.g., wheelchair or bedside chair)?: A Little Help needed to walk in hospital room?: A Little Help needed climbing 3-5 steps with a railing? : Total 6 Click Score: 16    End of Session Equipment Utilized During Treatment: Gait belt Activity Tolerance: Patient tolerated treatment well Patient left: in chair;with chair alarm set;with call bell/phone within reach;with family/visitor present   PT Visit Diagnosis: Other abnormalities of gait and mobility (R26.89);Other symptoms and signs involving the nervous system (R29.898)    Time: 1610-9604 PT Time Calculation (min) (ACUTE ONLY): 36 min   Charges:   PT Evaluation $PT Eval Moderate Complexity: 1 Mod   PT General Charges $$ ACUTE  PT VISIT: 1 Visit         Sheran Lawless, PT Acute Rehabilitation Services Office:[phone] 06/11/2023   Elray Mcgregor 06/11/2023, 4:40 PM

## 2023-06-11 NOTE — Consult Note (Signed)
Referring Physician: Rejeana Brock, MD  Sara Donaldson is an 89 y.o. female.                       Chief Complaint: Stroke in patient with HTN and AV replacement  HPI: 89 years old white female with PMH of CAD, AV replacement in 02/2014, HLD and obesity had acute stroke in left MCA with complete revasculariztion with 4X 40 mm solitaire X retriever device on 06/10/2023. Her BP is soft without her usual BP medications. Echocardiogram is pending. EKG shows normal sinus rhythm with possible old ASWMI.  Past Medical History:  Diagnosis Date   Aortic stenosis    Bronchitis    Heart murmur    Hypercholesterolemia    Hypertension    Thrombocytopenia (HCC) 03/05/2014      Past Surgical History:  Procedure Laterality Date   AORTIC VALVE REPLACEMENT N/A 03/02/2014   Procedure: AORTIC VALVE REPLACEMENT (AVR);  Surgeon: Loreli Slot, MD;  Location: [facility] OR;  Service: Open Heart Surgery;  Laterality: N/A;   CARDIAC CATHETERIZATION     4/10 2015   EYE SURGERY     LEFT HEART CATHETERIZATION WITH CORONARY ANGIOGRAM N/A 10/24/2013   Procedure: LEFT HEART CATHETERIZATION WITH CORONARY ANGIOGRAM;  Surgeon: Ricki Rodriguez, MD;  Location: [facility] CATH LAB;  Service: Cardiovascular;  Laterality: N/A;   RADIOLOGY WITH ANESTHESIA N/A 06/10/2023   Procedure: IR WITH ANESTHESIA;  Surgeon: Radiologist, Medication, MD;  Location: [facility] OR;  Service: Radiology;  Laterality: N/A;   TEE WITHOUT CARDIOVERSION N/A 01/12/2014   Procedure: TRANSESOPHAGEAL ECHOCARDIOGRAM (TEE);  Surgeon: Ricki Rodriguez, MD;  Location: [facility] ENDOSCOPY;  Service: Cardiovascular;  Laterality: N/A;    Family History  Problem Relation Age of Onset   Heart disease Mother    Heart disease Father    Hypertension Other    Social History:  reports that she has never smoked. She has never used smokeless tobacco. She reports that she does not drink alcohol and does not use drugs.  Allergies:  Allergies  Allergen Reactions   Silicone Other  (See Comments)    Adhesive tape causes blisters; use paper tape only   Tape Other (See Comments)    Adhesive tape causes blisters; use paper tape only   Trazodone And Nefazodone Hives    Medications Prior to Admission  Medication Sig Dispense Refill   ALPRAZolam (XANAX) 0.5 MG tablet Take 0.5 mg by mouth.     hydrALAZINE (APRESOLINE) 25 MG tablet Take 25 mg by mouth 2 (two) times daily.     losartan (COZAAR) 100 MG tablet Take 100 mg by mouth daily.     amLODipine (NORVASC) 5 MG tablet Take 5 mg by mouth daily.     aspirin EC 81 MG tablet Take 81 mg by mouth daily at 12 noon.      Calcium Carb-Cholecalciferol (CALCIUM 600+D3) 600-800 MG-UNIT TABS Take 1 tablet by mouth 2 (two) times daily.     Carboxymeth-Glycerin-Polysorb (REFRESH OPTIVE ADVANCED) 0.5-1-0.5 % SOLN Place 1 drop into both eyes 2 (two) times daily.     furosemide (LASIX) 20 MG tablet Take 20 mg by mouth daily.     lisinopril (PRINIVIL,ZESTRIL) 5 MG tablet Take 1 tablet (5 mg total) by mouth daily. 30 tablet 3   lovastatin (MEVACOR) 20 MG tablet Take 20 mg by mouth 2 (two) times daily.     metoprolol tartrate (LOPRESSOR) 25 MG tablet Take 1 tablet (25 mg total) by mouth  2 (two) times daily. 60 tablet 3   Multiple Vitamin (MULTIVITAMIN WITH MINERALS) TABS tablet Take 1 tablet by mouth every evening. 4pm (Centrum Silver)     niacin 500 MG tablet Take 500 mg by mouth 2 (two) times daily.     omeprazole (PRILOSEC) 20 MG capsule Take 20 mg by mouth daily.     potassium chloride (K-DUR) 10 MEQ tablet Take 10 mEq by mouth 2 (two) times daily.     vitamin E 400 UNIT capsule Take 400 Units by mouth every evening. 4pm     zolpidem (AMBIEN) 5 MG tablet Take 5 mg by mouth at bedtime.      Results for orders placed or performed during the hospital encounter of 06/10/23 (from the past 48 hour(s))  CBG monitoring, ED     Status: Abnormal   Collection Time: 06/10/23 12:00 PM  Result Value Ref Range   Glucose-Capillary 152 (H) 70 - 99  mg/dL    Comment: Glucose reference range applies only to samples taken after fasting for at least 8 hours.  I-stat chem 8, ED     Status: Abnormal   Collection Time: 06/10/23 12:10 PM  Result Value Ref Range   Sodium 140 135 - 145 mmol/L   Potassium 3.8 3.5 - 5.1 mmol/L   Chloride 99 98 - 111 mmol/L   BUN 15 8 - 23 mg/dL   Creatinine, Ser 2.53 0.44 - 1.00 mg/dL   Glucose, Bld 664 (H) 70 - 99 mg/dL    Comment: Glucose reference range applies only to samples taken after fasting for at least 8 hours.   Calcium, Ion 1.13 (L) 1.15 - 1.40 mmol/L   TCO2 27 22 - 32 mmol/L   Hemoglobin 17.0 (H) 12.0 - 15.0 g/dL   HCT 40.3 (H) 47.4 - 25.9 %  Protime-INR     Status: None   Collection Time: 06/10/23 12:19 PM  Result Value Ref Range   Prothrombin Time 14.0 11.4 - 15.2 seconds   INR 1.1 0.8 - 1.2    Comment: (NOTE) INR goal varies based on device and disease states. Performed at [facility], [address] [place], [place] [address]   APTT     Status: None   Collection Time: 06/10/23 12:19 PM  Result Value Ref Range   aPTT 24 24 - 36 seconds    Comment: Performed at [facility], [address] [place], [place] [address]  CBC     Status: Abnormal   Collection Time: 06/10/23 12:19 PM  Result Value Ref Range   WBC 14.0 (H) 4.0 - 10.5 K/uL   RBC 5.30 (H) 3.87 - 5.11 MIL/uL   Hemoglobin 15.4 (H) 12.0 - 15.0 g/dL   HCT 29.5 (H) 18.8 - 41.6 %   MCV 89.2 80.0 - 100.0 fL   MCH 29.1 26.0 - 34.0 pg   MCHC 32.6 30.0 - 36.0 g/dL   RDW 60.6 30.1 - 60.1 %   Platelets 187 150 - 400 K/uL   nRBC 0.0 0.0 - 0.2 %    Comment: Performed at [facility], [address], [place], [place] [address]  Differential     Status: Abnormal   Collection Time: 06/10/23 12:19 PM  Result Value Ref Range   Neutrophils Relative % 85 %   Neutro Abs 12.0 (H) 1.7 - 7.7 K/uL   Lymphocytes Relative 7 %   Lymphs Abs 1.0 0.7 - 4.0 K/uL   Monocytes Relative 5 %  Monocytes Absolute 0.7 0.1 - 1.0 K/uL    Eosinophils Relative 1 %   Eosinophils Absolute 0.1 0.0 - 0.5 K/uL   Basophils Relative 1 %   Basophils Absolute 0.1 0.0 - 0.1 K/uL   Immature Granulocytes 1 %   Abs Immature Granulocytes 0.08 (H) 0.00 - 0.07 K/uL    Comment: Performed at [facility], [address] [place], [place] [address]  Comprehensive metabolic panel     Status: Abnormal   Collection Time: 06/10/23 12:19 PM  Result Value Ref Range   Sodium 138 135 - 145 mmol/L   Potassium 3.7 3.5 - 5.1 mmol/L   Chloride 99 98 - 111 mmol/L   CO2 26 22 - 32 mmol/L   Glucose, Bld 153 (H) 70 - 99 mg/dL    Comment: Glucose reference range applies only to samples taken after fasting for at least 8 hours.   BUN 13 8 - 23 mg/dL   Creatinine, Ser 6.04 0.44 - 1.00 mg/dL   Calcium 9.4 8.9 - 54.0 mg/dL   Total Protein 7.5 6.5 - 8.1 g/dL   Albumin 4.1 3.5 - 5.0 g/dL   AST 31 15 - 41 U/L   ALT 19 0 - 44 U/L   Alkaline Phosphatase 67 38 - 126 U/L   Total Bilirubin 0.6 <1.2 mg/dL   GFR, Estimated >98 >11 mL/min    Comment: (NOTE) Calculated using the CKD-EPI Creatinine Equation (2021)    Anion gap 13 5 - 15    Comment: Performed at [facility], [address], [place], [place] [address]  Ethanol     Status: None   Collection Time: 06/10/23 12:20 PM  Result Value Ref Range   Alcohol, Ethyl (B) <10 <10 mg/dL    Comment: (NOTE) Lowest detectable limit for serum alcohol is 10 mg/dL.  For medical purposes only. Performed at [facility], [address], [place], [place] [address]   MRSA Next Gen by PCR, Nasal     Status: None   Collection Time: 06/10/23  2:54 PM   Specimen: Nasal Mucosa; Nasal Swab  Result Value Ref Range   MRSA by PCR Next Gen NOT DETECTED NOT DETECTED    Comment: (NOTE) The GeneXpert MRSA Assay (FDA approved for NASAL specimens only), is one component of a comprehensive MRSA colonization surveillance program. It is not intended to diagnose MRSA infection nor to guide or monitor treatment  for MRSA infections. Test performance is not FDA approved in patients less than 58 years old. Performed at [facility], [address], [place], [place] [address]   Hemoglobin A1c     Status: None   Collection Time: 06/10/23  4:16 PM  Result Value Ref Range   Hgb A1c MFr Bld 5.4 4.8 - 5.6 %    Comment: (NOTE) Pre diabetes:          5.7%-6.4%  Diabetes:              >6.4%  Glycemic control for   <7.0% adults with diabetes    Mean Plasma Glucose 108.28 mg/dL    Comment: Performed at [facility], [address] [place], [place] [address]  CBC     Status: Abnormal   Collection Time: 06/10/23  4:16 PM  Result Value Ref Range   WBC 12.2 (H) 4.0 - 10.5 K/uL   RBC 4.82 3.87 - 5.11 MIL/uL   Hemoglobin 14.3 12.0 - 15.0 g/dL   HCT 43.0  36.0 - 46.0 %   MCV 89.2 80.0 - 100.0 fL   MCH 29.7 26.0 - 34.0 pg   MCHC 33.3 30.0 - 36.0 g/dL   RDW 40.9 81.1 - 91.4 %   Platelets 180 150 - 400 K/uL   nRBC 0.2 0.0 - 0.2 %    Comment: Performed at [facility], [address], [place], [place] [address]  Creatinine, serum     Status: None   Collection Time: 06/10/23  4:16 PM  Result Value Ref Range   Creatinine, Ser 0.66 0.44 - 1.00 mg/dL   GFR, Estimated >62 >13 mL/min    Comment: (NOTE) Calculated using the CKD-EPI Creatinine Equation (2021) Performed at [facility], [address], [place], [place] [address]   Lipid panel     Status: None   Collection Time: 06/11/23  5:38 AM  Result Value Ref Range   Cholesterol 117 0 - 200 mg/dL   Triglycerides 75 <846 mg/dL   HDL 52 >96 mg/dL   Total CHOL/HDL Ratio 2.3 RATIO   VLDL 15 0 - 40 mg/dL   LDL Cholesterol 50 0 - 99 mg/dL    Comment:        Total Cholesterol/HDL:CHD Risk Coronary Heart Disease Risk Table                     Men   Women  1/2 Average Risk   3.4   3.3  Average Risk       5.0   4.4  2 X Average Risk   9.6   7.1  3 X Average Risk  23.4   11.0        Use the calculated Patient Ratio above and the  CHD Risk Table to determine the patient's CHD Risk.        ATP III CLASSIFICATION (LDL):  <100     mg/dL   Optimal  295-284  mg/dL   Near or Above                    Optimal  130-159  mg/dL   Borderline  132-440  mg/dL   High  >102     mg/dL   Very High Performed at [facility], [address] [place], [place] [address]   CBC with Differential/Platelet     Status: Abnormal   Collection Time: 06/11/23  5:38 AM  Result Value Ref Range   WBC 12.2 (H) 4.0 - 10.5 K/uL   RBC 4.54 3.87 - 5.11 MIL/uL   Hemoglobin 13.0 12.0 - 15.0 g/dL   HCT 64.4 03.4 - 74.2 %   MCV 89.9 80.0 - 100.0 fL   MCH 28.6 26.0 - 34.0 pg   MCHC 31.9 30.0 - 36.0 g/dL   RDW 59.5 63.8 - 75.6 %   Platelets 187 150 - 400 K/uL   nRBC 0.0 0.0 - 0.2 %   Neutrophils Relative % 83 %   Neutro Abs 10.2 (H) 1.7 - 7.7 K/uL   Lymphocytes Relative 8 %   Lymphs Abs 1.0 0.7 - 4.0 K/uL   Monocytes Relative 8 %   Monocytes Absolute 0.9 0.1 - 1.0 K/uL   Eosinophils Relative 0 %   Eosinophils Absolute 0.0 0.0 - 0.5 K/uL   Basophils Relative 0 %   Basophils Absolute 0.0 0.0 - 0.1 K/uL   Immature Granulocytes 1 %   Abs Immature Granulocytes 0.07 0.00 - 0.07 K/uL  Comment: Performed at [facility], [address] [place], [place] [address]  Basic metabolic panel     Status: Abnormal   Collection Time: 06/11/23  5:38 AM  Result Value Ref Range   Sodium 135 135 - 145 mmol/L   Potassium 3.7 3.5 - 5.1 mmol/L   Chloride 105 98 - 111 mmol/L   CO2 22 22 - 32 mmol/L   Glucose, Bld 112 (H) 70 - 99 mg/dL    Comment: Glucose reference range applies only to samples taken after fasting for at least 8 hours.   BUN 9 8 - 23 mg/dL   Creatinine, Ser 1.91 0.44 - 1.00 mg/dL   Calcium 8.5 (L) 8.9 - 10.3 mg/dL   GFR, Estimated >47 >82 mL/min    Comment: (NOTE) Calculated using the CKD-EPI Creatinine Equation (2021)    Anion gap 8 5 - 15    Comment: Performed at [facility], [address] [place], [place] [address]    MR BRAIN WO CONTRAST  Result Date: 06/11/2023 CLINICAL DATA:  Stroke-like symptoms, status post thrombectomy for left M1 occlusion EXAM: MRI HEAD WITHOUT CONTRAST TECHNIQUE: Multiplanar, multiecho pulse sequences of the brain and surrounding structures were obtained without intravenous contrast. COMPARISON:  06/10/2023 CT head, no prior MRI available FINDINGS: Brain: Restricted diffusion with ADC correlate in the left putamen, anterior greater than posterior, and left caudate head (series 2, images 24-33), consistent with acute infarcts. Additional small cortical infarct more superior left frontal lobe (series 2, image 39). No acute hemorrhage, mass, mass effect, or midline shift. No hydrocephalus or extra-axial collection. Pituitary and craniocervical junction within normal limits. T2 hyperintense signal in the periventricular white matter, likely the sequela of moderate to severe chronic small vessel ischemic disease. Vascular: The left MCA flow void is present but appears diminutive compared to the right. Otherwise normal arterial flow voids. Skull and upper cervical spine: Normal marrow signal. Sinuses/Orbits: Clear paranasal sinuses. No acute finding in the orbits. Status post bilateral lens replacements. Other: The mastoid air cells are well aerated. IMPRESSION: 1. Acute infarcts in the left putamen, anterior greater than posterior, and left caudate head. Additional small cortical infarct in the more superior left frontal lobe. 2. The left MCA flow void is present but appears diminutive compared to the right. Electronically Signed   By: Wiliam Ke M.D.   On: 06/11/2023 02:09   CT ANGIO HEAD NECK W WO CM W PERF (CODE STROKE)  Result Date: 06/10/2023 CLINICAL DATA:  89 year old female code stroke presentation with plain head CT evidence of left MCA ELVO. EXAM: CT ANGIOGRAPHY HEAD AND NECK CT PERFUSION BRAIN TECHNIQUE: Multidetector CT imaging of the head and neck was performed using the standard  protocol during bolus administration of intravenous contrast. Multiplanar CT image reconstructions and MIPs were obtained to evaluate the vascular anatomy. Carotid stenosis measurements (when applicable) are obtained utilizing NASCET criteria, using the distal internal carotid diameter as the denominator. Multiphase CT imaging of the brain was performed following IV bolus contrast injection. Subsequent parametric perfusion maps were calculated using RAPID software. RADIATION DOSE REDUCTION: This exam was performed according to the departmental dose-optimization program which includes automated exposure control, adjustment of the mA and/or kV according to patient size and/or use of iterative reconstruction technique. CONTRAST:  OMNIPAQUE IOHEXOL 350 MG/ML SOLN COMPARISON:  Plain head CT 1212 hours today. FINDINGS: CT Brain Perfusion Findings: ASPECTS: 7 CBF (<30%) Volume: 7mL (up to 13 mL using the least stringent parameters). Perfusion (  Tmax>6.0s) volume: 87mL. Hypoperfusion index of 0.4 (34 mL of T-max greater than 10 seconds) Mismatch Volume: 80mL Infarction Location:Left MCA CTA NECK Skeleton: Previous sternotomy. Age-appropriate cervical spine degeneration. No acute osseous abnormality identified. Upper chest: Prior sternotomy. Mild upper lung atelectasis and gas trapping. Other neck: Partially retropharyngeal course of both carotids, otherwise negative for age. Aortic arch: 3 vessel arch. Mild arch tortuosity. Calcified aortic atherosclerosis. Right carotid system: Brachiocephalic artery is mildly tortuous without stenosis. Mildly tortuous right CCA with a retropharyngeal course. Bulky calcified plaque at the right ICA origin and bulb, up to 50 % stenosis with respect to the distal vessel. Tortuous right ICA distal to the bulb. Left carotid system: Tortuous left CCA with no plaque or stenosis. Retropharyngeal course distally. Mild to moderate calcified plaque at the left ICA origin and bulb with 50 %  stenosis with respect to the distal vessel (series 11, image 84). Mildly tortuous left ICA distal to the bulb. Vertebral arteries: Calcified right subclavian artery origin and tortuosity without stenosis. Normal right vertebral artery origin. Tortuous right vertebral artery in the neck with no plaque or stenosis to the skull base. Proximal left subclavian artery mild plaque without stenosis. Normal left vertebral artery origin. Tortuous left vertebral artery in the neck with no plaque or stenosis to the skull base. CTA HEAD Posterior circulation: Mild bilateral V4 segment atherosclerosis with no significant stenosis. Patent vertebrobasilar junction. Normal right PICA origin. Left AICA appears to be dominant. Patent basilar artery without stenosis. Patent SCA and PCA origins. Small posterior communicating artery(s). Bilateral PCA branches are within normal limits. Anterior circulation: Both ICA siphons are patent. On the left there is mild to moderate calcified plaque primarily from the anterior genu distally. Only mild left siphon stenosis. On the right there is more moderate calcified plaque, and there is moderate to severe right supraclinoid stenosis suspected on series 9, image 103 and series 13, image 64. Patent carotid termini. Patent MCA and ACA origins. Normal anterior communicating artery. Bilateral ACA branches are within normal limits. Right MCA M1 segment and right MCA branches are within normal limits. Left MCA M1 is occluded distal to the temporal artery origins series 7, image 24. Very little early left MCA branch reconstitution. Venous sinuses: Early contrast timing, not evaluated. Anatomic variants: None significant. Review of the MIP images confirms the above findings IMPRESSION: 1. Positive for Left MCA M1 Occlusion. This was communicated to Dr. Derry Lory at 1217 hours via the [facility] messaging system. 2. CTP estimates a 7 mL infarct core concordant with ASPECTS. 87 mL estimated penumbra.  Hypoperfusion index of 0.4. 3. Generalized arterial tortuosity in the head and neck. Bilateral carotid atherosclerosis with up to 50% stenosis on the Left. Where as distal Right ICA siphon stenosis appears moderate to severe. No significant posterior circulation stenosis. 4.  Aortic Atherosclerosis (ICD10-I70.0). Electronically Signed   By: Odessa Fleming M.D.   On: 06/10/2023 12:30   CT HEAD CODE STROKE WO CONTRAST  Result Date: 06/10/2023 CLINICAL DATA:  Code stroke.  89 year old female. EXAM: CT HEAD WITHOUT CONTRAST TECHNIQUE: Contiguous axial images were obtained from the base of the skull through the vertex without intravenous contrast. RADIATION DOSE REDUCTION: This exam was performed according to the departmental dose-optimization program which includes automated exposure control, adjustment of the mA and/or kV according to patient size and/or use of iterative reconstruction technique. COMPARISON:  Head CT 10/30/2013. FINDINGS: Brain: Patchy asymmetric bilateral white matter and deep gray matter hypodensity slightly greater in the left hemisphere. But  furthermore, there is suspicious asymmetric loss of left basal ganglia gray-white differentiation on series 2, image 14. Posterior left insula also indistinct. Other left MCA territory gray-white appears preserved. No midline shift, mass effect, or evidence of intracranial mass lesion. No ventriculomegaly. No acute intracranial hemorrhage identified. Vascular: Asymmetric hyperdensity of the left MCA distal M1 on sagittal image 31. Skull: No acute osseous abnormality identified. Sinuses/Orbits: Paranasal sinuses, middle ears and mastoids remain well aerated, mild. Mild retained secretions in the right ethmoid and maxillary sinuses. Other: Rightward gaze deviation.  Stable scalp soft tissues. ASPECTS [facility] Stroke Program Early CT Score) Total score (0-10 with 10 being normal): 7 IMPRESSION: 1. Hyperdense Left MCA and evidence of Left basal ganglia cytotoxic  edema. ASPECTS 7. No acute hemorrhage identified. These results were communicated to Dr. Derry Lory at 12:16 pm on 06/10/2023 by text page via the [facility] messaging system. 2. Underlying bilateral small vessel disease. Electronically Signed   By: Odessa Fleming M.D.   On: 06/10/2023 12:17    Review Of Systems Constitutional: No fever, chills, weight loss or gain. Eyes: No vision change, wears glasses. No discharge or pain. Ears: No hearing loss, No tinnitus. Respiratory: No asthma, COPD, pneumonias. No shortness of breath. No hemoptysis. Cardiovascular: No chest pain, palpitation, leg edema. S/P AV replacement-02/2014. Gastrointestinal: No nausea, vomiting, diarrhea, constipation. No GI bleed. No hepatitis. Genitourinary: No dysuria, hematuria, kidney stone. No incontinance. Neurological: No headache, positive recent stroke, seizures.  Psychiatry: No psych facility admission for anxiety, depression, suicide. No detox. Skin: No rash. Musculoskeletal: Positive joint pain, no fibromyalgia. No neck pain, back pain. Lymphadenopathy: No lymphadenopathy. Hematology: No anemia or easy bruising.   Blood pressure 118/72, pulse (!) 108, temperature 98.6 F (37 C), temperature source Oral, resp. rate 18, weight 65.6 kg, SpO2 98%. Body mass index is 27.33 kg/m. General appearance: alert, cooperative, appears stated age and no distress Head: Normocephalic, atraumatic.  Eyes: Blue eyes, pink conjunctiva, corneas clear. PERRL, EOM's intact. Wears glasses. Neck: No adenopathy, no carotid bruit, no JVD, supple, symmetrical, trachea midline and thyroid not enlarged. Resp: Clear to auscultation bilaterally. Cardio: Regular rate and rhythm, S1, S2 normal, II/VI systolic murmur, no click, rub or gallop GI: Soft, non-tender; bowel sounds normal; no organomegaly. Extremities: No edema, cyanosis or clubbing. Skin: Warm and dry.  Neurologic: Alert and oriented X 2, Thick speech and mild right sided  weakness.  Assessment/Plan Acute left MCA stroke HTN HLD Obesity AV replacement for AV stenosis (2015)  Plan: Hold antihypertensive for now. [place] echocardiogram.  Time spent: Review of old records, Lab, x-rays, EKG, other cardiac tests, examination, discussion with patient/Nurse/Family over 70 minutes.  Ricki Rodriguez, MD  06/11/2023, 1:05 PM

## 2023-06-11 NOTE — Progress Notes (Addendum)
STROKE TEAM PROGRESS NOTE   BRIEF HPI Ms. Sara Donaldson is a 89 y.o. female with history of HTN, HLD, aortic stenosis, aortic valve repair, tachycardia, anxiety, previous concerns for Afib but no diagnosis presents after being found down with dysarthria and word salad  NIH on Admission 17   SIGNIFICANT HOSPITAL EVENTS 11/24: admitted. Underwent thrombectomy of Left M1 occlusion.  11/25: MRI brain with Acute infarcts in the left putamen, and left caudate head. EKG A fib   INTERIM HISTORY/SUBJECTIVE  Family is at the bedside. Patient is awake and alert sitting up in bed in NAD. She was on Neo gtt overnight and is now off.  EKG with A fib. HR slightly elevated and will start 12.5 mg metoprolol.  Echo and [place] LE ordered   OBJECTIVE  CBC    Component Value Date/Time   WBC 12.2 (H) 06/11/2023 0538   RBC 4.54 06/11/2023 0538   HGB 13.0 06/11/2023 0538   HCT 40.8 06/11/2023 0538   PLT 187 06/11/2023 0538   MCV 89.9 06/11/2023 0538   MCH 28.6 06/11/2023 0538   MCHC 31.9 06/11/2023 0538   RDW 13.2 06/11/2023 0538   LYMPHSABS 1.0 06/11/2023 0538   MONOABS 0.9 06/11/2023 0538   EOSABS 0.0 06/11/2023 0538   BASOSABS 0.0 06/11/2023 0538    BMET    Component Value Date/Time   NA 135 06/11/2023 0538   K 3.7 06/11/2023 0538   CL 105 06/11/2023 0538   CO2 22 06/11/2023 0538   GLUCOSE 112 (H) 06/11/2023 0538   BUN 9 06/11/2023 0538   CREATININE 0.59 06/11/2023 0538   CALCIUM 8.5 (L) 06/11/2023 0538   GFRNONAA >60 06/11/2023 0538    IMAGING past 24 hours MR BRAIN WO CONTRAST  Result Date: 06/11/2023 CLINICAL DATA:  Stroke-like symptoms, status post thrombectomy for left M1 occlusion EXAM: MRI HEAD WITHOUT CONTRAST TECHNIQUE: Multiplanar, multiecho pulse sequences of the brain and surrounding structures were obtained without intravenous contrast. COMPARISON:  06/10/2023 CT head, no prior MRI available FINDINGS: Brain: Restricted diffusion with ADC correlate in the left putamen,  anterior greater than posterior, and left caudate head (series 2, images 24-33), consistent with acute infarcts. Additional small cortical infarct more superior left frontal lobe (series 2, image 39). No acute hemorrhage, mass, mass effect, or midline shift. No hydrocephalus or extra-axial collection. Pituitary and craniocervical junction within normal limits. T2 hyperintense signal in the periventricular white matter, likely the sequela of moderate to severe chronic small vessel ischemic disease. Vascular: The left MCA flow void is present but appears diminutive compared to the right. Otherwise normal arterial flow voids. Skull and upper cervical spine: Normal marrow signal. Sinuses/Orbits: Clear paranasal sinuses. No acute finding in the orbits. Status post bilateral lens replacements. Other: The mastoid air cells are well aerated. IMPRESSION: 1. Acute infarcts in the left putamen, anterior greater than posterior, and left caudate head. Additional small cortical infarct in the more superior left frontal lobe. 2. The left MCA flow void is present but appears diminutive compared to the right. Electronically Signed   By: Wiliam Ke M.D.   On: 06/11/2023 02:09    Vitals:   06/11/23 0900 06/11/23 1000 06/11/23 1100 06/11/23 1200  BP: (!) 130/58 (!) 140/83 118/72 108/71  Pulse: (!) 104 96 (!) 108 (!) 107  Resp: 20 16 18 14   Temp:      TempSrc:      SpO2: 97% 91% 98% 93%  Weight:  PHYSICAL EXAM General:  Alert, well-nourished, well-developed patient in no acute distress Psych:  Mood and affect appropriate for situation CV: irregularly irregular  Respiratory:  Regular, unlabored respirations on room air GI: Abdomen soft and nontender   NEURO:  Mental Status: awake, alert, orientated to place, people, unable to state correct age or time with perseveration.  Speech/Language: speech is with mild dysarthria. Naming 3/4 with perseveration. Able to repeat simple but not complex sentences.    Cranial Nerves:  II: PERRL. Visual fields right hemianopia.  III, IV, VI: EOMI. Eyelids elevate symmetrically.  V: Sensation is intact to light touch and symmetrical to face.  ZOX:WRUEA facial droop VIII: hearing intact to voice. IX, X: Palate elevates symmetrically. Phonation is normal.  VW:UJWJXBJY shrug 5/5. XII: tongue is midline without fasciculations. Motor: 5/5 strength to all muscle groups tested. Right arm with slight pronator drift  Tone: is normal and bulk is normal Sensation- Intact to light touch bilaterally. Extinction on the right to light touch to DSS.   Coordination: FTN intact bilaterally  Gait- deferred   ASSESSMENT/PLAN  Stroke:  left MCA infarct with left MCA M1 occlusion s/p IR with TICI 3  Etiology:  cardioembolic in the setting of newly diagnosed A fib   Code Stroke  CT head Hyperdense Left MCA and evidence of Left basal ganglia cytotoxic edema. ASPECTS 7 CTA head & neck Positive for Left MCA M1 Occlusion  CT perfusion 7/87 Stay post IR left M1 occlusion with TICI3 Post IR CT no hemorrhage  MRI  Acute infarcts in the left putamen, anterior greater than posterior, and left caudate head Doppler LE no DVT 2D Echo ordered LDL 50 HgbA1c 5.4 VTE prophylaxis - Lovenox  aspirin 81 mg daily prior to admission, now on aspirin 81 mg daily. Will consider DOAC in a couple of days.  Therapy recommendations:  CIR Disposition:  pending  Atrial fibrillation, new diagnosis Home Meds: none  EKG 11/25 A fib with RVR Currently on ASA 325, will need to consider DOAC in 2 days Dr. Algie Coffer on board  Hx of hypertension Hypotension, improved  Home meds: Amlodipine 5 mg, Lasix 20 mg, hydralazine 25 mg, lisinopril 5 mg, losartan 100 mg, metoprolol 25 mg, Was on Neo overnight is currently off Now BP stable but soft Start 12.5 mg metoprolol  Long term BP goal normotensive  Hyperlipidemia Home meds: Lovastatin 40 mg  LDL 50, goal < 70 Now on pravastatin 40 No high  intensity of statin due to LDL at goal Continue statin at discharge  Other Stroke Risk Factors Advanced age  Other Active Problems Leukocytosis 12.2- afebrile Aortic stenosis s/p Aortic valve replacement   Hospital day # 1  This patient is critically ill due to MCA stroke with M1 occlusion, status post IR, hypotension, new diagnosed A-fib and at significant risk of neurological worsening, death form recurrent stroke, hemorrhagic transformation, heart failure, cardiogenic shock. This patient's care requires constant monitoring of vital signs, hemodynamics, respiratory and cardiac monitoring, review of multiple databases, neurological assessment, discussion with family, other specialists and medical decision making of high complexity. I spent 40 minutes of neurocritical care time in the care of this patient. I had long discussion with daughter and granddaughter at bedside, updated pt current condition, treatment plan and potential prognosis, and answered all the questions.  They expressed understanding and appreciation.   Marvel Plan, MD PhD Stroke Neurology 06/11/2023 6:36 PM      To contact Stroke Continuity provider, please refer to [email or web address]. After hours,  contact General Neurology

## 2023-06-11 NOTE — Progress Notes (Signed)
Patient stated she already had an ultrasound (venous); explained iT was a vascular ultrasound; and stated she wants to finish eating and for me to come back later.   Dondra Prader RVT RCS

## 2023-06-11 NOTE — Evaluation (Addendum)
Speech Language Pathology Evaluation Patient Details Name: Sara Donaldson MRN: [id number] DOB: Dec 16, 1929 Today's Date: 06/11/2023 Time: 1250-1311 SLP Time Calculation (min) (ACUTE ONLY): 21 min  Problem List:  Patient Active Problem List   Diagnosis Date Noted   Acute ischemic left MCA stroke (HCC) 06/10/2023   Middle cerebral artery embolism, left 06/10/2023   Thrombocytopenia (HCC) 03/05/2014   S/P AVR 03/02/2014   Aortic valve stenosis, severe 02/09/2014   Hypertension 02/09/2014   Hyperlipidemia 02/09/2014   Past Medical History:  Past Medical History:  Diagnosis Date   Aortic stenosis    Bronchitis    Heart murmur    Hypercholesterolemia    Hypertension    Thrombocytopenia (HCC) 03/05/2014   Past Surgical History:  Past Surgical History:  Procedure Laterality Date   AORTIC VALVE REPLACEMENT N/A 03/02/2014   Procedure: AORTIC VALVE REPLACEMENT (AVR);  Surgeon: Loreli Slot, MD;  Location: [facility] OR;  Service: Open Heart Surgery;  Laterality: N/A;   CARDIAC CATHETERIZATION     4/10 2015   EYE SURGERY     LEFT HEART CATHETERIZATION WITH CORONARY ANGIOGRAM N/A 10/24/2013   Procedure: LEFT HEART CATHETERIZATION WITH CORONARY ANGIOGRAM;  Surgeon: Ricki Rodriguez, MD;  Location: [facility] CATH LAB;  Service: Cardiovascular;  Laterality: N/A;   RADIOLOGY WITH ANESTHESIA N/A 06/10/2023   Procedure: IR WITH ANESTHESIA;  Surgeon: Radiologist, Medication, MD;  Location: [facility] OR;  Service: Radiology;  Laterality: N/A;   TEE WITHOUT CARDIOVERSION N/A 01/12/2014   Procedure: TRANSESOPHAGEAL ECHOCARDIOGRAM (TEE);  Surgeon: Ricki Rodriguez, MD;  Location: [facility] ENDOSCOPY;  Service: Cardiovascular;  Laterality: N/A;   HPI:  89 year old female presented to ED with aphasia. Dx left MCA CVA, complete revascularization 06/10/23. PMHx CAD, AV replacement in 02/2014, HLD and obesity.   Independent with driving, shopping, cooking, gardening, meds PTA.   Assessment / Plan / Recommendation Clinical  Impression  Pt presents with a mild aphasia, mild articulation deficits marked by subtle groping efforts and decreased clarity of consonants.  She demonstrated mild naming deficits at the level of divergent naming; otherwise, confrontation and responsive naming were WNL.  Mild dysfluency is present. She followed one, two and three step commands and yes/no reliability was good. She was able to read a menu and select food choices for lunch. Her daughter reports independence with driving, self-care, gardening, and med-management at baseline. Recommend ongoing SLP while admitted; f/u needs to be determined in the context of OT/PT evals.  Bedside swallow eval not needed - pt passed Rn swallow screened.    SLP Assessment  SLP Recommendation/Assessment: Patient needs continued Speech Lanaguage Pathology Services SLP Visit Diagnosis: Aphasia (R47.01)    Recommendations for follow up therapy are one component of a multi-disciplinary discharge planning process, led by the attending physician.  Recommendations may be updated based on patient status, additional functional criteria and insurance authorization.    Follow Up Recommendations  Other (comment) (tba)    Assistance Recommended at Discharge     Functional Status Assessment Patient has had a recent decline in their functional status and demonstrates the ability to make significant improvements in function in a reasonable and predictable amount of time.  Frequency and Duration min 2x/week  1 week      SLP Evaluation Cognition  Arousal/Alertness: Awake/alert Orientation Level: Oriented X4 Attention: Selective Selective Attention: Appears intact Memory: Appears intact       Comprehension  Auditory Comprehension Yes/No Questions: Within Functional Limits Commands: Within Functional Limits Conversation: Simple Visual Recognition/Discrimination Discrimination:  Within Function Limits Reading Comprehension Reading Status: Within funtional  limits    Expression Expression Primary Mode of Expression: Verbal Verbal Expression Overall Verbal Expression: Impaired Automatic Speech: Day of week;Month of year Level of Generative/Spontaneous Verbalization: Sentence Naming: Impairment Responsive: 76-100% accurate Convergent: 75-100% accurate Divergent: 50-74% accurate Pragmatics: No impairment Written Expression Dominant Hand: Right Written Expression: Not tested   Oral / Motor  Oral Motor/Sensory Function Overall Oral Motor/Sensory Function: Mild impairment Facial ROM: Reduced right;Suspected CN VII (facial) dysfunction Facial Symmetry: Abnormal symmetry right;Suspected CN VII (facial) dysfunction Lingual ROM: Within Functional Limits Lingual Symmetry: Within Functional Limits Velum: Within Functional Limits Mandible: Within Functional Limits Motor Speech Overall Motor Speech: Impaired Respiration: Within functional limits Phonation: Normal Resonance: Within functional limits Articulation: Impaired Level of Impairment: Sentence Intelligibility: Intelligibility reduced Word: 75-100% accurate Phrase: 75-100% accurate Sentence: 75-100% accurate Motor Planning: Impaired Level of Impairment: Sentence Motor Speech Errors: Groping for words            Blenda Mounts Laurice 06/11/2023, 1:50 PM Marchelle Folks L. Samson Frederic, MA CCC/SLP Clinical Specialist - Acute Care SLP Acute Rehabilitation Services Office number [phone]

## 2023-06-11 NOTE — Evaluation (Signed)
Occupational Therapy Evaluation Patient Details Name: Sara Donaldson MRN: [id number] DOB: 12-16-1929 Today's Date: 06/11/2023   History of Present Illness 89 y.o. female s/p L MCA stroke.  complete revascularization 06/10/23. PMHx CAD, AV replacement in 02/2014, HLD and obesity.   Clinical Impression   Pt admitted with deficits from sustaining a L MCA stroke. Pt currently with functional limitations due to the deficits listed below (see OT Problem List). Prior to admit, pt reports that she was living alone and independent with all BADL, IADL, and functional mobility. Pt was driving. No DME was used. Patient will benefit from intensive inpatient follow up therapy, >3 hours/day. Pt will benefit from acute skilled OT to increase their safety and independence with ADL and functional mobility for ADL to facilitate discharge. OT will continue to follow patient acutely.         If plan is discharge home, recommend the following: A lot of help with walking and/or transfers;A lot of help with bathing/dressing/bathroom;Assistance with cooking/housework;Direct supervision/assist for financial management;Direct supervision/assist for medications management;Assist for transportation;Supervision due to cognitive status    Functional Status Assessment  Patient has had a recent decline in their functional status and demonstrates the ability to make significant improvements in function in a reasonable and predictable amount of time.  Equipment Recommendations  Other (comment) (defer to next venue of care)    Recommendations for Other Services Rehab consult     Precautions / Restrictions Precautions Precautions: Fall;Other (comment) Precaution Comments: R inattention Restrictions Weight Bearing Restrictions: No      Mobility Bed Mobility Overal bed mobility: Needs Assistance Bed Mobility: Supine to Sit     Supine to sit: Contact guard, HOB elevated, Used rails     General bed mobility comments:  VC to initiate task. Increased time required to complete.    Transfers Overall transfer level: Needs assistance Equipment used: 1 person hand held assist Transfers: Sit to/from Stand, Bed to chair/wheelchair/BSC Sit to Stand: Min assist     Step pivot transfers: Min assist     General transfer comment: VC required for sequencing and direction due to R inattention and decreased balance.      Balance Overall balance assessment: Needs assistance Sitting-balance support: No upper extremity supported, Feet supported Sitting balance-Leahy Scale: Fair Sitting balance - Comments: sitting EOB   Standing balance support: Single extremity supported, During functional activity Standing balance-Leahy Scale: Poor Standing balance comment: Required at least 1 hand held assist to maintain balance. Pt also reaching for footboard of bed when walking by for additional support.              ADL either performed or assessed with clinical judgement   ADL Overall ADL's : Needs assistance/impaired Eating/Feeding: Set up;Sitting (VC to attend to Right side of tray. Pt did not notice drink and fruit until OT used visual cues)   Grooming: Wash/dry hands;Contact guard assist;Standing;Cueing for sequencing Grooming Details (indicate cue type and reason): VC required to locate soap and paper towels which were located on the right side of pt. Upper Body Bathing: Minimal assistance;Sitting;Cueing for sequencing   Lower Body Bathing: Maximal assistance;Sit to/from stand;Sitting/lateral leans   Upper Body Dressing : Minimal assistance;Sitting;Cueing for sequencing   Lower Body Dressing: Minimal assistance;Bed level;Sitting/lateral leans Lower Body Dressing Details (indicate cue type and reason): Pt donned one sock at bed level, HOB elevated with increased difficulty and excertion. Other sock donned while seated EOB with slightly less difficulty. Toilet Transfer: Minimal assistance;Ambulation;Regular  Toilet;Grab bars;Cueing for sequencing  Toilet Transfer Details (indicate cue type and reason): Regular toilet height too tall with pt only touching floor with tip toes. Toileting- Clothing Manipulation and Hygiene: Minimal assistance;Cueing for sequencing;Sitting/lateral lean;Sit to/from stand               Vision Baseline Vision/History: 1 Wears glasses Ability to See in Adequate Light: 0 Adequate Patient Visual Report: No change from baseline Vision Assessment?: Yes Alignment/Gaze Preference: Head turned;Head tilt;Chin down;Other (comment) Visual Fields: Impaired-to be further tested in functional context Depth Perception: Undershoots Additional Comments: R visual and physical inattention noted. While walking with PT, pt demonstrated head down and tilted position. VC provided to allow pt to self correct. While seated and eating lunch, pt demonstrated head turned to the left posture.     Perception Perception: Impaired Preception Impairment Details: Inattention/Neglect, Figure ground Perception-Other Comments: Right side inattention. Impaired depth perception- undershoots   Praxis Praxis: Impaired Praxis Impairment Details: Motor planning, Organization Praxis-Other Comments: motor apraxia   Pertinent Vitals/Pain Pain Assessment Pain Assessment: No/denies pain     Extremity/Trunk Assessment Upper Extremity Assessment Upper Extremity Assessment: Right hand dominant;RUE deficits/detail RUE Deficits / Details: A/ROM WFL. overall strength: 4/5. RUE Sensation:  (NT) RUE Coordination: decreased gross motor;decreased fine motor   Lower Extremity Assessment Lower Extremity Assessment: Defer to PT evaluation   Cervical / Trunk Assessment Cervical / Trunk Assessment: Kyphotic   Communication Communication Communication: Difficulty communicating thoughts/reduced clarity of speech Cueing Techniques: Verbal cues   Cognition Arousal: Alert Behavior During Therapy: WFL for tasks  assessed/performed Overall Cognitive Status: Impaired/Different from baseline Area of Impairment: Attention, Memory, Following commands, Safety/judgement, Awareness, Problem solving      Current Attention Level: Focused Memory: Decreased short-term memory Following Commands: Follows one step commands inconsistently, Follows one step commands with increased time Safety/Judgement: Decreased awareness of safety, Decreased awareness of deficits Awareness: Intellectual Problem Solving: Slow processing, Decreased initiation, Difficulty sequencing, Requires verbal cues General Comments: easily distracted when multiple family members in room.     General Comments  BP monitored during session: 135/72 in bed, 115/70 sitting EOB, 122/77 sitting in recliner. O2 removed and pt completed session on RA. SpO2 remaining at 91% or higher. HR in V-tach during session. Rest break provided during LB dressing task. Highest HR: 133 bpm.            Home Living Family/patient expects to be discharged to:: Private residence Living Arrangements: Alone Available Help at Discharge: Family;Available 24 hours/day (Daughter will be able to stay with pt at discharge and provide assistance) Type of Home: House Home Access: Stairs to enter [employer] of Steps: 1   Home Layout: One level     Bathroom Shower/Tub: Producer, television/film/video: Standard     Home Equipment: None   Additional Comments: wears glasses all the time  Lives With: Alone    Prior Functioning/Environment Prior Level of Function : Independent/Modified Independent;Driving      ADLs Comments: Pt completed all BADL and IADL tasks without assistance.        OT Problem List: Decreased strength;Decreased activity tolerance;Impaired balance (sitting and/or standing);Impaired vision/perception;Decreased coordination;Decreased cognition;Decreased safety awareness;Decreased knowledge of use of DME or AE;Impaired UE functional  use      OT Treatment/Interventions: Self-care/ADL training;Therapeutic exercise;Neuromuscular education;Energy conservation;DME and/or AE instruction;Manual therapy;Therapeutic activities;Visual/perceptual remediation/compensation;Balance training;Patient/family education    OT Goals(Current goals can be found in the care plan section) Acute Rehab OT Goals Patient Stated Goal: to get better OT Goal Formulation: With patient Time  For Goal Achievement: 06/25/23 Potential to Achieve Goals: Good  OT Frequency: Min 1X/week    Co-evaluation PT/OT/SLP Co-Evaluation/Treatment: Yes Reason for Co-Treatment: To address functional/ADL transfers   OT goals addressed during session: ADL's and self-care;Strengthening/ROM      AM-PAC OT "6 Clicks" Daily Activity     Outcome Measure Help from another person eating meals?: A Little Help from another person taking care of personal grooming?: A Little Help from another person toileting, which includes using toliet, bedpan, or urinal?: A Little Help from another person bathing (including washing, rinsing, drying)?: A Lot Help from another person to put on and taking off regular upper body clothing?: A Little Help from another person to put on and taking off regular lower body clothing?: A Lot 6 Click Score: 16   End of Session Equipment Utilized During Treatment: Gait belt Nurse Communication: Mobility status;Other (comment) (vitals during session)  Activity Tolerance: Patient tolerated treatment well Patient left: in chair;with call bell/phone within reach;with chair alarm set;with family/visitor present  OT Visit Diagnosis: Unsteadiness on feet (R26.81);Muscle weakness (generalized) (M62.81);Apraxia (R48.2);Other symptoms and signs involving cognitive function;Hemiplegia and hemiparesis Hemiplegia - Right/Left: Right Hemiplegia - dominant/non-dominant: Dominant Hemiplegia - caused by: Cerebral infarction                Time: 1610-9604 OT Time  Calculation (min): 36 min Charges:  OT General Charges $OT Visit: 1 Visit OT Evaluation $OT Eval High Complexity: 1 High  [employer], OTR/L,CBIS  Supplemental OT - [facility] and [facility] Secure Chat Preferred    Sara Donaldson, Charisse March 06/11/2023, 3:07 PM

## 2023-06-11 NOTE — Progress Notes (Signed)
BLE venous duplex has been completed.   Results can be found under chart review under CV PROC. 06/11/2023 3:53 PM Chanika Byland RVT, RDMS

## 2023-06-11 NOTE — Progress Notes (Signed)
Referring Physician(s): CODE STROKE  Supervising Physician: Julieanne Cotton  Patient Status:  [facility] - In-pt  Chief Complaint: Occluded left MCA mid M1 segment s/p thrombectomy with TICI 3 revascularization.    Subjective: Patient awake/alert resting in bed. Her daughter is at the bedside. Mild right sided weakness/deficits.   Allergies: Silicone, Tape, and Trazodone and nefazodone  Medications: Prior to Admission medications   Medication Sig Start Date End Date Taking? Authorizing Provider  ALPRAZolam Prudy Feeler) 0.5 MG tablet Take 0.5 mg by mouth.   Yes [provider]  hydrALAZINE (APRESOLINE) 25 MG tablet Take 25 mg by mouth 2 (two) times daily. 06/05/23  Yes [provider]  losartan (COZAAR) 100 MG tablet Take 100 mg by mouth daily. 05/17/23  Yes [provider]  amLODipine (NORVASC) 5 MG tablet Take 5 mg by mouth daily.    [provider]  aspirin EC 81 MG tablet Take 81 mg by mouth daily at 12 noon.     [provider]  Calcium Carb-Cholecalciferol (CALCIUM 600+D3) 600-800 MG-UNIT TABS Take 1 tablet by mouth 2 (two) times daily.    [provider]  Carboxymeth-Glycerin-Polysorb (REFRESH OPTIVE ADVANCED) 0.5-1-0.5 % SOLN Place 1 drop into both eyes 2 (two) times daily.    [provider]  furosemide (LASIX) 20 MG tablet Take 20 mg by mouth daily.    [provider]  lisinopril (PRINIVIL,ZESTRIL) 5 MG tablet Take 1 tablet (5 mg total) by mouth daily. 03/10/14   Barrett, Erin R, PA-C  lovastatin (MEVACOR) 20 MG tablet Take 20 mg by mouth 2 (two) times daily.    [provider]  metoprolol tartrate (LOPRESSOR) 25 MG tablet Take 1 tablet (25 mg total) by mouth 2 (two) times daily. 03/10/14   Barrett, Erin R, PA-C  Multiple Vitamin (MULTIVITAMIN WITH MINERALS) TABS tablet Take 1 tablet by mouth every evening. 4pm (Centrum Silver)    [provider]  niacin 500 MG tablet Take 500 mg by mouth 2 (two)  times daily.    [provider]  omeprazole (PRILOSEC) 20 MG capsule Take 20 mg by mouth daily.    [provider]  potassium chloride (K-DUR) 10 MEQ tablet Take 10 mEq by mouth 2 (two) times daily.    [provider]  vitamin E 400 UNIT capsule Take 400 Units by mouth every evening. 4pm    [provider]  zolpidem (AMBIEN) 5 MG tablet Take 5 mg by mouth at bedtime.    [provider]     Vital Signs: BP 118/72   Pulse (!) 108   Temp 98.6 F (37 C) (Oral)   Resp 18   Wt 144 lb 10 oz (65.6 kg)   SpO2 98%   BMI 27.33 kg/m   Physical Exam Constitutional:      General: She is not in acute distress.    Appearance: She is not ill-appearing.  Eyes:     General: Visual field deficit present.  Cardiovascular:     Rate and Rhythm: Rhythm irregular.     Comments: Right groin vascular site is clean, soft, dry and non-tender.  Pulmonary:     Effort: Pulmonary effort is normal.  Skin:    General: Skin is warm and dry.  Neurological:     Mental Status: She is alert and oriented to person, place, and time.     Cranial Nerves: Dysarthria and facial asymmetry present.     Coordination: Finger-Nose-Finger Test abnormal.  Comments: Right sided weakness, mild right arm drift. Slight delay in responses.      Imaging: MR BRAIN WO CONTRAST  Result Date: 06/11/2023 CLINICAL DATA:  Stroke-like symptoms, status post thrombectomy for left M1 occlusion EXAM: MRI HEAD WITHOUT CONTRAST TECHNIQUE: Multiplanar, multiecho pulse sequences of the brain and surrounding structures were obtained without intravenous contrast. COMPARISON:  06/10/2023 CT head, no prior MRI available FINDINGS: Brain: Restricted diffusion with ADC correlate in the left putamen, anterior greater than posterior, and left caudate head (series 2, images 24-33), consistent with acute infarcts. Additional small cortical infarct more superior left frontal lobe (series 2, image 39). No acute  hemorrhage, mass, mass effect, or midline shift. No hydrocephalus or extra-axial collection. Pituitary and craniocervical junction within normal limits. T2 hyperintense signal in the periventricular white matter, likely the sequela of moderate to severe chronic small vessel ischemic disease. Vascular: The left MCA flow void is present but appears diminutive compared to the right. Otherwise normal arterial flow voids. Skull and upper cervical spine: Normal marrow signal. Sinuses/Orbits: Clear paranasal sinuses. No acute finding in the orbits. Status post bilateral lens replacements. Other: The mastoid air cells are well aerated. IMPRESSION: 1. Acute infarcts in the left putamen, anterior greater than posterior, and left caudate head. Additional small cortical infarct in the more superior left frontal lobe. 2. The left MCA flow void is present but appears diminutive compared to the right. Electronically Signed   By: Wiliam Ke M.D.   On: 06/11/2023 02:09   CT ANGIO HEAD NECK W WO CM W PERF (CODE STROKE)  Result Date: 06/10/2023 CLINICAL DATA:  89 year old female code stroke presentation with plain head CT evidence of left MCA ELVO. EXAM: CT ANGIOGRAPHY HEAD AND NECK CT PERFUSION BRAIN TECHNIQUE: Multidetector CT imaging of the head and neck was performed using the standard protocol during bolus administration of intravenous contrast. Multiplanar CT image reconstructions and MIPs were obtained to evaluate the vascular anatomy. Carotid stenosis measurements (when applicable) are obtained utilizing NASCET criteria, using the distal internal carotid diameter as the denominator. Multiphase CT imaging of the brain was performed following IV bolus contrast injection. Subsequent parametric perfusion maps were calculated using RAPID software. RADIATION DOSE REDUCTION: This exam was performed according to the departmental dose-optimization program which includes automated exposure control, adjustment of the mA and/or kV  according to patient size and/or use of iterative reconstruction technique. CONTRAST:  OMNIPAQUE IOHEXOL 350 MG/ML SOLN COMPARISON:  Plain head CT 1212 hours today. FINDINGS: CT Brain Perfusion Findings: ASPECTS: 7 CBF (<30%) Volume: 7mL (up to 13 mL using the least stringent parameters). Perfusion (Tmax>6.0s) volume: 87mL. Hypoperfusion index of 0.4 (34 mL of T-max greater than 10 seconds) Mismatch Volume: 80mL Infarction Location:Left MCA CTA NECK Skeleton: Previous sternotomy. Age-appropriate cervical spine degeneration. No acute osseous abnormality identified. Upper chest: Prior sternotomy. Mild upper lung atelectasis and gas trapping. Other neck: Partially retropharyngeal course of both carotids, otherwise negative for age. Aortic arch: 3 vessel arch. Mild arch tortuosity. Calcified aortic atherosclerosis. Right carotid system: Brachiocephalic artery is mildly tortuous without stenosis. Mildly tortuous right CCA with a retropharyngeal course. Bulky calcified plaque at the right ICA origin and bulb, up to 50 % stenosis with respect to the distal vessel. Tortuous right ICA distal to the bulb. Left carotid system: Tortuous left CCA with no plaque or stenosis. Retropharyngeal course distally. Mild to moderate calcified plaque at the left ICA origin and bulb with 50 % stenosis with respect to the distal vessel (series 11,  image 84). Mildly tortuous left ICA distal to the bulb. Vertebral arteries: Calcified right subclavian artery origin and tortuosity without stenosis. Normal right vertebral artery origin. Tortuous right vertebral artery in the neck with no plaque or stenosis to the skull base. Proximal left subclavian artery mild plaque without stenosis. Normal left vertebral artery origin. Tortuous left vertebral artery in the neck with no plaque or stenosis to the skull base. CTA HEAD Posterior circulation: Mild bilateral V4 segment atherosclerosis with no significant stenosis. Patent vertebrobasilar  junction. Normal right PICA origin. Left AICA appears to be dominant. Patent basilar artery without stenosis. Patent SCA and PCA origins. Small posterior communicating artery(s). Bilateral PCA branches are within normal limits. Anterior circulation: Both ICA siphons are patent. On the left there is mild to moderate calcified plaque primarily from the anterior genu distally. Only mild left siphon stenosis. On the right there is more moderate calcified plaque, and there is moderate to severe right supraclinoid stenosis suspected on series 9, image 103 and series 13, image 64. Patent carotid termini. Patent MCA and ACA origins. Normal anterior communicating artery. Bilateral ACA branches are within normal limits. Right MCA M1 segment and right MCA branches are within normal limits. Left MCA M1 is occluded distal to the temporal artery origins series 7, image 24. Very little early left MCA branch reconstitution. Venous sinuses: Early contrast timing, not evaluated. Anatomic variants: None significant. Review of the MIP images confirms the above findings IMPRESSION: 1. Positive for Left MCA M1 Occlusion. This was communicated to Dr. Derry Lory at 1217 hours via the [facility] messaging system. 2. CTP estimates a 7 mL infarct core concordant with ASPECTS. 87 mL estimated penumbra. Hypoperfusion index of 0.4. 3. Generalized arterial tortuosity in the head and neck. Bilateral carotid atherosclerosis with up to 50% stenosis on the Left. Where as distal Right ICA siphon stenosis appears moderate to severe. No significant posterior circulation stenosis. 4.  Aortic Atherosclerosis (ICD10-I70.0). Electronically Signed   By: Odessa Fleming M.D.   On: 06/10/2023 12:30   CT HEAD CODE STROKE WO CONTRAST  Result Date: 06/10/2023 CLINICAL DATA:  Code stroke.  89 year old female. EXAM: CT HEAD WITHOUT CONTRAST TECHNIQUE: Contiguous axial images were obtained from the base of the skull through the vertex without intravenous contrast. RADIATION  DOSE REDUCTION: This exam was performed according to the departmental dose-optimization program which includes automated exposure control, adjustment of the mA and/or kV according to patient size and/or use of iterative reconstruction technique. COMPARISON:  Head CT 10/30/2013. FINDINGS: Brain: Patchy asymmetric bilateral white matter and deep gray matter hypodensity slightly greater in the left hemisphere. But furthermore, there is suspicious asymmetric loss of left basal ganglia gray-white differentiation on series 2, image 14. Posterior left insula also indistinct. Other left MCA territory gray-white appears preserved. No midline shift, mass effect, or evidence of intracranial mass lesion. No ventriculomegaly. No acute intracranial hemorrhage identified. Vascular: Asymmetric hyperdensity of the left MCA distal M1 on sagittal image 31. Skull: No acute osseous abnormality identified. Sinuses/Orbits: Paranasal sinuses, middle ears and mastoids remain well aerated, mild. Mild retained secretions in the right ethmoid and maxillary sinuses. Other: Rightward gaze deviation.  Stable scalp soft tissues. ASPECTS [facility] Stroke Program Early CT Score) Total score (0-10 with 10 being normal): 7 IMPRESSION: 1. Hyperdense Left MCA and evidence of Left basal ganglia cytotoxic edema. ASPECTS 7. No acute hemorrhage identified. These results were communicated to Dr. Derry Lory at 12:16 pm on 06/10/2023 by text page via the [facility] messaging system. 2. Underlying bilateral  small vessel disease. Electronically Signed   By: Odessa Fleming M.D.   On: 06/10/2023 12:17    Labs:  CBC: Recent Labs    12/07/22 1136 06/10/23 1210 06/10/23 1219 06/10/23 1616 06/11/23 0538  WBC 10.2  --  14.0* 12.2* 12.2*  HGB 15.5* 17.0* 15.4* 14.3 13.0  HCT 46.8* 50.0* 47.3* 43.0 40.8  PLT 192  --  187 180 187    COAGS: Recent Labs    06/10/23 1219  INR 1.1  APTT 24    BMP: Recent Labs    12/07/22 1136 06/10/23 1210 06/10/23 1219  06/10/23 1616 06/11/23 0538  NA 134* 140 138  --  135  K 4.1 3.8 3.7  --  3.7  CL 97* 99 99  --  105  CO2 25  --  26  --  22  GLUCOSE 116* 154* 153*  --  112*  BUN 8 15 13   --  9  CALCIUM 9.4  --  9.4  --  8.5*  CREATININE 0.59 0.60 0.66 0.66 0.59  GFRNONAA >60  --  >60 >60 >60    LIVER FUNCTION TESTS: Recent Labs    06/10/23 1219  BILITOT 0.6  AST 31  ALT 19  ALKPHOS 67  PROT 7.5  ALBUMIN 4.1    Assessment and Plan:  Occluded left MCA mid M1 segment s/p thrombectomy with TICI 3 revascularization.  Post procedure CT brain showed no hemorrhagic complications. MR Brain this morning showed: IMPRESSION: 1. Acute infarcts in the left putamen, anterior greater than posterior, and left caudate head. Additional small cortical infarct in the more superior left frontal lobe. 2. The left MCA flow void is present but appears diminutive compared to the right.  Patient received 81 mg aspirin and 40 mg lovenox. She is alert and oriented with some delayed responses and mild right-sided weakness. Right groin vascular site is clean, soft, dry and non-tender. She's been started on a heart healthy diet.   Further plans per Neurology. Please contact NIR with any questions.   Electronically Signed: Alwyn Ren, AGACNP-BC [phone] 06/11/2023, 1:17 PM   I spent a total of 15 Minutes at the the patient's bedside AND on the patient's hospital floor or unit, greater than 50% of which was counseling/coordinating care for s/p thrombectomy

## 2023-06-12 ENCOUNTER — Inpatient Hospital Stay (HOSPITAL_COMMUNITY): Payer: Medicare Other

## 2023-06-12 DIAGNOSIS — I63512 Cerebral infarction due to unspecified occlusion or stenosis of left middle cerebral artery: Secondary | ICD-10-CM | POA: Diagnosis not present

## 2023-06-12 LAB — ECHOCARDIOGRAM COMPLETE
AR max vel: 1.98 cm2
AV Area VTI: 2.19 cm2
AV Area mean vel: 1.93 cm2
AV Mean grad: 5 mm[Hg]
AV Peak grad: 9.5 mm[Hg]
Ao pk vel: 1.54 m/s
Area-P 1/2: 5.5 cm2
S' Lateral: 2.6 cm
Weight: 2313.95 [oz_av]

## 2023-06-12 LAB — CBC
HCT: 40.7 % (ref 36.0–46.0)
Hemoglobin: 13 g/dL (ref 12.0–15.0)
MCH: 29.1 pg (ref 26.0–34.0)
MCHC: 31.9 g/dL (ref 30.0–36.0)
MCV: 91.3 fL (ref 80.0–100.0)
Platelets: 130 10*3/uL — ABNORMAL LOW (ref 150–400)
RBC: 4.46 MIL/uL (ref 3.87–5.11)
RDW: 13.4 % (ref 11.5–15.5)
WBC: 8 10*3/uL (ref 4.0–10.5)
nRBC: 0 % (ref 0.0–0.2)

## 2023-06-12 LAB — BASIC METABOLIC PANEL
Anion gap: 9 (ref 5–15)
BUN: 14 mg/dL (ref 8–23)
CO2: 24 mmol/L (ref 22–32)
Calcium: 9 mg/dL (ref 8.9–10.3)
Chloride: 105 mmol/L (ref 98–111)
Creatinine, Ser: 0.65 mg/dL (ref 0.44–1.00)
GFR, Estimated: >60 mL/min (ref >60)
Glucose, Bld: 104 mg/dL — ABNORMAL HIGH (ref 70–99)
Potassium: 3.8 mmol/L (ref 3.5–5.1)
Sodium: 138 mmol/L (ref 135–145)

## 2023-06-12 MED ORDER — APIXABAN 2.5 MG PO TABS
2.5000 mg | ORAL_TABLET | Freq: Two times a day (BID) | ORAL | Status: DC
  Administered 2023-06-12 – 2023-06-13 (×2): 2.5 mg via ORAL
  Filled 2023-06-12 (×2): qty 1

## 2023-06-12 MED ORDER — ASPIRIN 81 MG PO TBEC
81.0000 mg | DELAYED_RELEASE_TABLET | Freq: Every day | ORAL | Status: DC
  Administered 2023-06-13: 81 mg via ORAL
  Filled 2023-06-12: qty 1

## 2023-06-12 MED ORDER — METOPROLOL TARTRATE 25 MG PO TABS
25.0000 mg | ORAL_TABLET | Freq: Two times a day (BID) | ORAL | Status: DC
  Administered 2023-06-12 – 2023-06-13 (×2): 25 mg via ORAL
  Filled 2023-06-12 (×2): qty 1

## 2023-06-12 MED ORDER — ALPRAZOLAM 0.25 MG PO TABS: 0.2500 mg | ORAL_TABLET | Freq: Every evening | ORAL | Status: DC | PRN

## 2023-06-12 MED ORDER — DILTIAZEM HCL 30 MG PO TABS
60.0000 mg | ORAL_TABLET | Freq: Four times a day (QID) | ORAL | Status: DC
  Administered 2023-06-12 – 2023-06-13 (×5): 60 mg via ORAL
  Filled 2023-06-12 (×4): qty 2

## 2023-06-12 NOTE — Progress Notes (Addendum)
STROKE TEAM PROGRESS NOTE   BRIEF HPI Ms. Sara Donaldson is a 89 y.o. female with history of HTN, HLD, aortic stenosis, aortic valve repair, tachycardia, anxiety, previous concerns for Afib but no diagnosis presents after being found down with dysarthria and word salad NIH on Admission 17  SIGNIFICANT HOSPITAL EVENTS 11/24: admitted. Underwent thrombectomy of Left M1 occlusion.  11/25: MRI brain with Acute infarcts in the left putamen, and left caudate head. EKG A fib   INTERIM HISTORY/SUBJECTIVE  Family at bedside. She is awake, alert and sitting up in bed. NAD.  HR has improved with metoprolol. Stable neuro exam, no acute events overnight.   Pending LE DVT US.   CIR for discharge planning.   OBJECTIVE  CBC    Component Value Date/Time   WBC 8.0 06/12/2023 0426   RBC 4.46 06/12/2023 0426   HGB 13.0 06/12/2023 0426   HCT 40.7 06/12/2023 0426   PLT 130 (L) 06/12/2023 0426   MCV 91.3 06/12/2023 0426   MCH 29.1 06/12/2023 0426   MCHC 31.9 06/12/2023 0426   RDW 13.4 06/12/2023 0426   LYMPHSABS 1.0 06/11/2023 0538   MONOABS 0.9 06/11/2023 0538   EOSABS 0.0 06/11/2023 0538   BASOSABS 0.0 06/11/2023 0538    BMET    Component Value Date/Time   NA 138 06/12/2023 0426   K 3.8 06/12/2023 0426   CL 105 06/12/2023 0426   CO2 24 06/12/2023 0426   GLUCOSE 104 (H) 06/12/2023 0426   BUN 14 06/12/2023 0426   CREATININE 0.65 06/12/2023 0426   CALCIUM 9.0 06/12/2023 0426   GFRNONAA >60 06/12/2023 0426    IMAGING past 24 hours ECHOCARDIOGRAM COMPLETE  Result Date: 06/12/2023    ECHOCARDIOGRAM REPORT   Patient Name:   TAJAE JACEK Date of Exam: 06/12/2023 Medical Rec #:  [id number]    Height:       61.0 in Accession #:    [id number]   Weight:       144.6 lb Date of Birth:  1930/02/21    BSA:          1.646 m Patient Age:    89 years     BP:           105/60 mmHg Patient Gender: F            HR:           115 bpm. Exam Location:  Inpatient Procedure: 2D Echo Indications:     I48.0  Paroxysmal atrial fibrillation  History:         Patient has prior history of Echocardiogram examinations. Prior                  Cardiac Surgery, Stroke, Aortic Valve Disease and Mitral Valve                  Disease, Arrythmias:Atrial Fibrillation,                  Signs/Symptoms:Hypertensive Heart Disease; Risk                  Factors:Hypertension.                  Aortic Valve: 21 mm Edwards pericardial valve is present in the                  aortic position.  Sonographer:     Dondra Prader RVT Referring Phys:  Terrilee Files [facility] Diagnosing Phys: Orpah Cobb MD  IMPRESSIONS  1. Left ventricular ejection fraction, by estimation, is 55 to 60%. The left ventricle has normal function. The left ventricle has no regional wall motion abnormalities. Left ventricular diastolic parameters are indeterminate.  2. Right ventricular systolic function is low normal. The right ventricular size is normal.  3. Left atrial size was moderately dilated.  4. Right atrial size was moderately dilated.  5. The mitral valve is degenerative. Mild to moderate mitral valve regurgitation. Moderate mitral annular calcification.  6. Tricuspid valve regurgitation is moderate to severe.  7. The aortic valve has been repaired/replaced. There is mild calcification of the aortic valve. There is mild thickening of the aortic valve. Aortic valve regurgitation is not visualized. Aortic valve sclerosis is present, with no evidence of aortic valve stenosis. There is a 21 mm Edwards pericardial valve present in the aortic position.  8. There is Moderate (Grade III) atheroma plaque involving the aortic root and ascending aorta.  9. The inferior vena cava is dilated in size with <50% respiratory variability, suggesting right atrial pressure of 15 mmHg. FINDINGS  Left Ventricle: Left ventricular ejection fraction, by estimation, is 55 to 60%. The left ventricle has normal function. The left ventricle has no regional wall motion abnormalities. The left  ventricular internal cavity size was normal in size. There is  borderline concentric left ventricular hypertrophy. Left ventricular diastolic parameters are indeterminate. Right Ventricle: The right ventricular size is normal. No increase in right ventricular wall thickness. Right ventricular systolic function is low normal. Left Atrium: Left atrial size was moderately dilated. Right Atrium: Right atrial size was moderately dilated. Pericardium: There is no evidence of pericardial effusion. Mitral Valve: The mitral valve is degenerative in appearance. Moderate mitral annular calcification. Mild to moderate mitral valve regurgitation. Tricuspid Valve: The tricuspid valve is normal in structure. Tricuspid valve regurgitation is moderate to severe. Aortic Valve: The aortic valve has been repaired/replaced. There is mild calcification of the aortic valve. There is mild thickening of the aortic valve. There is moderate to severe aortic valve annular calcification. Aortic valve regurgitation is not visualized. Aortic valve sclerosis is present, with no evidence of aortic valve stenosis. Aortic valve mean gradient measures 5.0 mmHg. Aortic valve peak gradient measures 9.5 mmHg. Aortic valve area, by VTI measures 2.19 cm. There is a 21 mm Edwards pericardial valve present in the aortic position. Pulmonic Valve: The pulmonic valve was normal in structure. Pulmonic valve regurgitation is not visualized. Aorta: The aortic root is normal in size and structure. There is moderate (Grade III) atheroma plaque involving the aortic root and ascending aorta. Venous: The inferior vena cava is dilated in size with less than 50% respiratory variability, suggesting right atrial pressure of 15 mmHg. IAS/Shunts: The atrial septum is grossly normal.  LEFT VENTRICLE PLAX 2D LVIDd:         3.80 cm LVIDs:         2.60 cm LV PW:         0.90 cm LV IVS:        1.10 cm LVOT diam:     1.80 cm LV SV:         43 LV SV Index:   26 LVOT Area:     2.54  cm  IVC IVC diam: 2.10 cm LEFT ATRIUM           Index        RIGHT ATRIUM           Index LA diam:  3.50 cm 2.13 cm/m   RA Area:     17.60 cm LA Vol (A4C): 68.9 ml 41.87 ml/m  RA Volume:   48.30 ml  29.35 ml/m  AORTIC VALVE                    PULMONIC VALVE AV Area (Vmax):    1.98 cm     PV Vmax:       0.88 m/s AV Area (Vmean):   1.93 cm     PV Peak grad:  3.1 mmHg AV Area (VTI):     2.19 cm AV Vmax:           154.00 cm/s AV Vmean:          99.333 cm/s AV VTI:            0.198 m AV Peak Grad:      9.5 mmHg AV Mean Grad:      5.0 mmHg LVOT Vmax:         120.00 cm/s LVOT Vmean:        75.300 cm/s LVOT VTI:          0.170 m LVOT/AV VTI ratio: 0.86  AORTA Ao Root diam: 2.30 cm Ao Asc diam:  3.70 cm MITRAL VALVE                TRICUSPID VALVE MV Area (PHT): 5.50 cm     TR Peak grad:   39.2 mmHg MV Decel Time: 138 msec     TR Vmax:        313.00 cm/s MV E velocity: 146.00 cm/s                             SHUNTS                             Systemic VTI:  0.17 m                             Systemic Diam: 1.80 cm Orpah Cobb MD Electronically signed by Orpah Cobb MD Signature Date/Time: 06/12/2023/8:58:34 AM    Final     Vitals:   06/12/23 0854 06/12/23 1007 06/12/23 1132 06/12/23 1545  BP:   126/76 124/63  Pulse:   99 (!) 102  Resp: (!) 21  18 18   Temp:   98.9 F (37.2 C) 98.8 F (37.1 C)  TempSrc:   Oral Oral  SpO2:   93% 93%  Weight:  65.6 kg    Height:  5\' 1"  (1.549 m)       PHYSICAL EXAM General:  Alert, well-nourished, well-developed patient in no acute distress Psych:  Mood and affect appropriate for situation CV: irregularly irregular  Respiratory:  Regular, unlabored respirations on room air GI: Abdomen soft and nontender   NEURO:  Mental Status: Awake, alert, oriented. She is unable to state correct month but does know her age. Speech/language:Marland Kitchen  Mild dysarthria present.  Slight perseveration present.  Able to repeat simple sentences.  Cranial Nerves:  II: PERRL.  right  hemianopia.  III, IV, VI: EOMI. Eyelids elevate symmetrically.  V: Sensation is intact and symmetrical to light touch VII: Right facial droop VIII: hearing intact to voice. IX, X: Symmetric palate, normal phonation [id number] shrug 5/5. XII: tongue is midline Motor: 5 out of 5 strength.  Right arm  with slight pronator drift. Tone: is normal and bulk is normal Sensation-intact bilaterally to light touch. Coordination: FTN intact bilaterally , no ataxia Gait- deferred  ASSESSMENT/PLAN  Stroke:  left MCA infarct with left MCA M1 occlusion s/p IR with TICI 3  Etiology:  cardioembolic in the setting of newly diagnosed A fib   Code Stroke  CT head Hyperdense Left MCA and evidence of Left basal ganglia cytotoxic edema. ASPECTS 7 CTA head & neck Positive for Left MCA M1 Occlusion  CT perfusion 7/87 Status post IR left M1 occlusion with TICI3 Post IR CT no hemorrhage  MRI  Acute infarcts in the left putamen, anterior greater than posterior, and left caudate head Doppler LE no DVT 2D Echo: LVEF 55 to 60%, moderately dilated left atria, moderately dilated right atria, mild to moderate MVR, moderate to severe TVR, pericardial aortic valve present, grade 3 atheroma plaque involving aortic root and ascending aorta.  LDL 50 HgbA1c 5.4 VTE prophylaxis - Lovenox  aspirin 81 mg daily prior to admission, will keep 81 mg Aspirin and add Eliquis, low dose for now. Will discuss with cardiology further in AM.  Therapy recommendations:  CIR Disposition:  pending  Atrial fibrillation, new diagnosis Home Meds: none  EKG 11/25 A fib with RVR Currently on ASA 81mg , add Eliquis, low-dose currently.  Dr. Algie Coffer on board (patient's outpatient cardiologist) Agrees with low-dose Eliquis  Hx of hypertension Home meds: Amlodipine 5 mg, Lasix 20 mg, hydralazine 25 mg, lisinopril 5 mg, losartan 100 mg, metoprolol 25 mg Now on 25 mg metoprolol  Long term BP goal normotensive  Hyperlipidemia Home meds:  Lovastatin 40 mg  LDL 50, goal < 70 Now on pravastatin 40 No high intensity of statin due to LDL at goal Continue statin at discharge  Other Stroke Risk Factors Advanced age  Other Active Problems Leukocytosis 12.2-> 8.0, afebrile  Hospital day # 2  Pt seen by Neuro NP/APP and later by MD. Note/plan to be edited by MD as needed.    Lynnae January, DNP, AGACNP-BC [facility] Please use AMION for contact information & EPIC for messaging.  ATTENDING NOTE: I reviewed above note and agree with the assessment and plan. Pt was seen and examined.   No family at bedside.  Patient neuro stable, right facial droop has improved, still has slurred speech.  Dr. Algie Coffer cardiology on board, agree with low-dose Eliquis.  Given aortic atherosclerosis grade 3, will keep aspirin 81.  Will discuss with Dr. Algie Coffer further regarding AC/AP regimen.  Continue pravastatin 40.  PT and OT recommend CIR.  For detailed assessment and plan, please refer to above/below as I have made changes wherever appropriate.   Marvel Plan, MD PhD Stroke Neurology 06/12/2023 6:31 PM     To contact Stroke Continuity provider, please refer to [email or web address]. After hours, contact General Neurology

## 2023-06-12 NOTE — Progress Notes (Signed)
Physical Therapy Treatment Patient Details Name: Sara Donaldson MRN: [id number] DOB: August 21, 1929 Today's Date: 06/12/2023   History of Present Illness 89 y.o. female s/p L MCA stroke.  complete revascularization 06/10/23. PMHx CAD, AV replacement in 02/2014, HLD and obesity.    PT Comments  Pt greeted resting in bed and agreeable to session with steady progress towards acute goals. Pt continues to be limited by decreased activity tolerance, impaired balance/postural reactions, R hemi-weakness and R inattention. Pt requiring light min A to come to sitting EOB as pt sitting up ion pillow in bed, min A to steady on rise from EOB and low commode and min A during gait with HHA and SPC. Pt with noted R inattention during gait, hugging rail in hall and needing max cues to avoid obstacles. Current plan remains appropriate to address deficits and maximize functional independence and decrease caregiver burden. Pt continues to benefit from skilled PT services to progress toward functional mobility goals.     If plan is discharge home, recommend the following: A little help with walking and/or transfers;A little help with bathing/dressing/bathroom;Assistance with feeding;Help with stairs or ramp for entrance;Assist for transportation;Assistance with cooking/housework   Can travel by private vehicle        Equipment Recommendations  Other (comment) (TBA)    Recommendations for Other Services       Precautions / Restrictions Precautions Precautions: Fall Precaution Comments: R inattention Restrictions Weight Bearing Restrictions: No     Mobility  Bed Mobility Overal bed mobility: Needs Assistance Bed Mobility: Supine to Sit     Supine to sit: Contact guard, HOB elevated, Used rails, Min assist     General bed mobility comments: CGA for 80%, min A for last 20% to EOB as pt sitting up on pillow    Transfers Overall transfer level: Needs assistance Equipment used: 1 person hand held assist,  Straight cane Transfers: Sit to/from Stand, Bed to chair/wheelchair/BSC Sit to Stand: Min assist           General transfer comment: VC required for sequencing and direction due to R inattention and decreased balance.    Ambulation/Gait Ambulation/Gait assistance: Min assist Gait Distance (Feet): 12 Feet (+ 65') Assistive device: 1 person hand held assist, Straight cane Gait Pattern/deviations: Step-through pattern, Decreased stride length, Trunk flexed Gait velocity: decr     General Gait Details: flexed posture assist for balance and safety with lines, walked to bathroom with HHA, out in hall with SPC on L, max dues to attend to L as pt hugging rail and with no anticapatory correction to avoid obstacles   Stairs             Wheelchair Mobility     Tilt Bed    Modified Rankin (Stroke Patients Only)       Balance Overall balance assessment: Needs assistance Sitting-balance support: Feet supported Sitting balance-Leahy Scale: Fair Sitting balance - Comments: sitting EOB and on toilet in bathroom   Standing balance support: Single extremity supported, During functional activity Standing balance-Leahy Scale: Poor Standing balance comment: Required at least 1 hand held assist to maintain balance. Pt also reaching for footboard of bed when walking by for additional support.                            Cognition Arousal: Alert Behavior During Therapy: WFL for tasks assessed/performed Overall Cognitive Status: Impaired/Different from baseline Area of Impairment: Attention, Memory, Following commands, Safety/judgement, Awareness, Problem  solving                   Current Attention Level: Focused Memory: Decreased short-term memory Following Commands: Follows one step commands inconsistently, Follows one step commands with increased time Safety/Judgement: Decreased awareness of safety, Decreased awareness of deficits Awareness:  Intellectual Problem Solving: Slow processing, Decreased initiation, Difficulty sequencing, Requires verbal cues General Comments: easily distracted, R innattention        Exercises General Exercises - Lower Extremity Long Arc Quad: AROM, Right, Left, 20 reps, Seated Hip Flexion/Marching: AROM, Right, Left, 20 reps, Seated    General Comments General comments (skin integrity, edema, etc.): VSS, daughter present and supportive      Pertinent Vitals/Pain Pain Assessment Pain Assessment: No/denies pain    Home Living                          Prior Function            PT Goals (current goals can now be found in the care plan section) Acute Rehab PT Goals Patient Stated Goal: return home after rehab PT Goal Formulation: With patient/family Time For Goal Achievement: 06/25/23 Progress towards PT goals: Progressing toward goals    Frequency    Min 1X/week      PT Plan      Co-evaluation              AM-PAC PT "6 Clicks" Mobility   Outcome Measure  Help needed turning from your back to your side while in a flat bed without using bedrails?: A Little Help needed moving from lying on your back to sitting on the side of a flat bed without using bedrails?: A Little Help needed moving to and from a bed to a chair (including a wheelchair)?: A Little Help needed standing up from a chair using your arms (e.g., wheelchair or bedside chair)?: A Little Help needed to walk in hospital room?: A Little Help needed climbing 3-5 steps with a railing? : Total 6 Click Score: 16    End of Session Equipment Utilized During Treatment: Gait belt Activity Tolerance: Patient tolerated treatment well Patient left: in chair;with call bell/phone within reach;with family/visitor present Nurse Communication: Mobility status PT Visit Diagnosis: Other abnormalities of gait and mobility (R26.89);Other symptoms and signs involving the nervous system (R29.898)     Time:  3244-0102 PT Time Calculation (min) (ACUTE ONLY): 20 min  Charges:    $Gait Training: 8-22 mins PT General Charges $$ ACUTE PT VISIT: 1 Visit                     Pahoua Schreiner R. PTA Acute Rehabilitation Services Office: [phone]   Catalina Antigua 06/12/2023, 10:16 AM

## 2023-06-12 NOTE — PMR Pre-admission (Signed)
PMR Admission Coordinator Pre-Admission Assessment  Patient: Sara Donaldson is an 89 y.o., female MRN: [id number] DOB: August 09, 1929 Height: 5\' 1"  (154.9 cm) Weight: 65.6 kg  Insurance Information HMO:     PPO:      PCP:      IPA:      80/20:      OTHER:  PRIMARY: Medicare a and b      Policy#: [id number]      Subscriber: pt Benefits:  Phone #: passport one source online     Name: 11/26 Eff. Date: 11/14/1996     Deduct: $1632      Out of Pocket Max: none      Life Max: none CIR: 100%      SNF: 20 full days Outpatient: 80%     Co-Pay: 20% Home Health: 100%      Co-Pay: none DME: 80%     Co-Pay: 20% Providers: in network  SECONDARY: BCBS supplement      Policy#: [id number]       Financial Counselor:       Phone#:   The "Data Collection Information Summary" for patients in Inpatient Rehabilitation Facilities with attached "Privacy Act Statement-Health Care Records" was provided and verbally reviewed with: Patient and Family  Emergency Contact Information Contact Information     Name Relation Home Work Mobile   Munjor Daughter   [phone]   [name] [phone]        Other Contacts   None on File     Current Medical History  Patient Admitting Diagnosis: CVA  History of Present Illness: 89 year old right-handed female with history significant for hypertension, obesity with BMI 27.33, tachycardia, anxiety, aortic stenosis with aortic valve repair 03/02/2014 maintained on aspirin daily.  Presented 06/10/2023 after being found down by her daughter with right sided weakness as well as right gaze preference with dysarthria.   Admission chemistries unremarkable except WBC 14,000, glucose 153, hemoglobin A1c 5.4.  Cranial CT scan showed hyperdense left MCA and evidence of left basal ganglia cytotoxic edema.  No acute hemorrhage identified.  CTA head and neck positive for left MCA M1 occlusion.  Generalized arterial tortuosity in the head and neck.  Bilateral  carotid atherosclerosis with up to 50% stenosis on the left.  No significant posterior circulation stenosis.  Underwent thrombectomy of left M1 occlusion per interventional radiology 11/24.  MRI follow-up 06/11/2023 shows acute infarct in the left putamen, anterior greater than posterior and left caudate head.  Additional small cortical infarct in the more superior left frontal lobe.  Echocardiogram with ejection fraction of 55 to 60% no wall motion abnormalities.  Hospital course cardiology services consulted 11/26 after telemetry monitor showed atrial fibrillation with moderate ventricular response.  She was placed on Cardizem as well as low-dose beta-blocker.  Patient currently maintained on low-dose aspirin and Eliquis awaiting plan by cardiology services.  Tolerating a regular consistency diet.   Complete NIHSS TOTAL: 1  Patient's medical record from [facility] has been reviewed by the rehabilitation admission coordinator and physician.  Past Medical History  Past Medical History:  Diagnosis Date   Aortic stenosis    Bronchitis    Heart murmur    Hypercholesterolemia    Hypertension    Thrombocytopenia (HCC) 03/05/2014   Has the patient had major surgery during 100 days prior to admission? Yes  Family History   family history includes Heart disease in her father and mother; Hypertension in an other family member.  Current Medications  Current  Facility-Administered Medications:    acetaminophen (TYLENOL) tablet 650 mg, 650 mg, Oral, Q4H PRN **OR** acetaminophen (TYLENOL) 160 MG/5ML solution 650 mg, 650 mg, Per Tube, Q4H PRN **OR** acetaminophen (TYLENOL) suppository 650 mg, 650 mg, Rectal, Q4H PRN, Deveshwar, Sanjeev, MD   ALPRAZolam Prudy Feeler) tablet 0.25 mg, 0.25 mg, Oral, QHS PRN, Orpah Cobb, MD   apixaban (ELIQUIS) tablet 2.5 mg, 2.5 mg, Oral, BID, Rollene Fare, RPH, 2.5 mg at 06/13/23 4098   aspirin EC tablet 81 mg, 81 mg, Oral, Daily, Hetty Blend C, NP, 81 mg at  06/13/23 1191   Chlorhexidine Gluconate Cloth 2 % PADS 6 each, 6 each, Topical, Daily, Erick Blinks, MD, 6 each at 06/13/23 0823   diltiazem (CARDIZEM) tablet 60 mg, 60 mg, Oral, QID, Orpah Cobb, MD, 60 mg at 06/13/23 4782   metoprolol tartrate (LOPRESSOR) tablet 25 mg, 25 mg, Oral, BID, Orpah Cobb, MD, 25 mg at 06/13/23 9562   Oral care mouth rinse, 15 mL, Mouth Rinse, PRN, Erick Blinks, MD   pravastatin (PRAVACHOL) tablet 40 mg, 40 mg, Oral, q1800, Marvel Plan, MD, 40 mg at 06/12/23 1705   senna-docusate (Senokot-S) tablet 1 tablet, 1 tablet, Oral, QHS PRN, Erick Blinks, MD   sodium chloride flush (NS) 0.9 % injection 3 mL, 3 mL, Intravenous, Once, Linwood Dibbles, MD  Patients Current Diet:  Diet Order             Diet heart healthy/carb modified Room service appropriate? Yes with Assist; Fluid consistency: Thin  Diet effective now                  Precautions / Restrictions Precautions Precautions: Fall Precaution Comments: R inattention Restrictions Weight Bearing Restrictions: No   Has the patient had 2 or more falls or a fall with injury in the past year? No  Prior Activity Level Community (5-7x/wk): indepenednt, living alone and driving  Prior Functional Level Self Care: Did the patient need help bathing, dressing, using the toilet or eating? Independent  Indoor Mobility: Did the patient need assistance with walking from room to room (with or without device)? Independent  Stairs: Did the patient need assistance with internal or external stairs (with or without device)? Independent  Functional Cognition: Did the patient need help planning regular tasks such as shopping or remembering to take medications? Independent  Patient Information Are you of Hispanic, Latino/a,or Spanish origin?: A. No, not of Hispanic, Latino/a, or Spanish origin What is your race?: A. White Do you need or want an interpreter to communicate with a doctor or health care  staff?: 0. No  Patient's Response To:  Health Literacy and Transportation Is the patient able to respond to health literacy and transportation needs?: Yes Health Literacy - How often do you need to have someone help you when you read instructions, pamphlets, or other written material from your doctor or pharmacy?: Never In the past 12 months, has lack of transportation kept you from medical appointments or from getting medications?: No In the past 12 months, has lack of transportation kept you from meetings, work, or from getting things needed for daily living?: No  Home Assistive Devices / Equipment Home Equipment: None  Prior Device Use: Indicate devices/aids used by the patient prior to current illness, exacerbation or injury?  cane  Current Functional Level Cognition  Arousal/Alertness: Awake/alert Overall Cognitive Status: Impaired/Different from baseline Current Attention Level: Focused Orientation Level: Oriented to person, Oriented to place, Oriented to situation Following Commands: Follows one step commands inconsistently,  Follows one step commands with increased time Safety/Judgement: Decreased awareness of safety, Decreased awareness of deficits General Comments: easily distracted, R innattention Attention: Selective Selective Attention: Appears intact Memory: Appears intact    Extremity Assessment (includes Sensation/Coordination)  Upper Extremity Assessment: Defer to OT evaluation RUE Deficits / Details: A/ROM [facility]. overall strength: 4/5. RUE Sensation:  (NT) RUE Coordination: decreased gross motor, decreased fine motor  Lower Extremity Assessment: Generalized weakness    ADLs  Overall ADL's : Needs assistance/impaired Eating/Feeding: Set up, Sitting (VC to attend to Right side of tray. Pt did not notice drink and fruit until OT used visual cues) Grooming: Wash/dry hands, Contact guard assist, Standing, Cueing for sequencing Grooming Details (indicate cue type and  reason): VC required to locate soap and paper towels which were located on the right side of pt. Upper Body Bathing: Minimal assistance, Sitting, Cueing for sequencing Lower Body Bathing: Maximal assistance, Sit to/from stand, Sitting/lateral leans Upper Body Dressing : Minimal assistance, Sitting, Cueing for sequencing Lower Body Dressing: Minimal assistance, Bed level, Sitting/lateral leans Lower Body Dressing Details (indicate cue type and reason): Pt donned one sock at bed level, HOB elevated with increased difficulty and excertion. Other sock donned while seated EOB with slightly less difficulty. Toilet Transfer: Minimal assistance, Ambulation, Regular Toilet, Grab bars, Cueing for sequencing Toilet Transfer Details (indicate cue type and reason): Regular toilet height too tall with pt only touching floor with tip toes. Toileting- Clothing Manipulation and Hygiene: Minimal assistance, Cueing for sequencing, Sitting/lateral lean, Sit to/from stand    Mobility  Overal bed mobility: Needs Assistance Bed Mobility: Sit to Supine Supine to sit: Contact guard, HOB elevated, Used rails, Min assist Sit to supine: Min assist, Contact guard assist General bed mobility comments: CGA to return to supine, min A to position in bed    Transfers  Overall transfer level: Needs assistance Equipment used: 1 person hand held assist, Straight cane Transfers: Sit to/from Stand, Bed to chair/wheelchair/BSC Sit to Stand: Min assist Bed to/from chair/wheelchair/BSC transfer type:: Step pivot Step pivot transfers: Min assist General transfer comment: light min A to steady on rise    Ambulation / Gait / Stairs / Psychologist, prison and probation services  Ambulation/Gait Ambulation/Gait assistance: Editor, commissioning (Feet): 125 Feet Assistive device: Straight cane Gait Pattern/deviations: Step-through pattern, Decreased stride length, Trunk flexed General Gait Details: improved attention to R with cues at start, with  increased distance and dual tasking pt bumping door frame on return to room Gait velocity: decr    Posture / Balance Dynamic Sitting Balance Sitting balance - Comments: sitting EOB and on toilet in bathroom Balance Overall balance assessment: Needs assistance Sitting-balance support: Feet supported Sitting balance-Leahy Scale: Fair Sitting balance - Comments: sitting EOB and on toilet in bathroom Standing balance support: Single extremity supported, During functional activity Standing balance-Leahy Scale: Poor Standing balance comment: Required at least 1 hand held assist to maintain balance. Pt also reaching for footboard of bed when walking by for additional support.    Special needs/care consideration    Previous Home Environment  Living Arrangements: Alone  Lives With: Alone Available Help at Discharge:  (daughter and grand daughter can provide 24/7 supervision) Type of Home: House Home Layout: One level Home Access: Stairs to enter [employer] of Steps: 1 Bathroom Shower/Tub: Health visitor: Standard Bathroom Accessibility: Yes How Accessible: Accessible via walker Home Care Services: No Additional Comments: wears glasses all the time  Discharge Living Setting Plans for Discharge Living Setting: Patient's home, Alone,  House Type of Home at Discharge: House Discharge Home Layout: One level Discharge Home Access: Stairs to enter Entrance Stairs-Rails: None Entrance Stairs-Number of Steps: 1 Discharge Bathroom Shower/Tub: Garment/textile technologist: Standard Discharge Bathroom Accessibility: Yes How Accessible: Accessible via walker Does the patient have any problems obtaining your medications?: No  Social/Family/Support Systems Contact Information: dtr, Kendal Hymen and grand dtr, Oceanographer Anticipated Caregiver: dtr and grand daughter Anticipated Caregiver's Contact Information: see contacts Ability/Limitations of Caregiver: no  limitations Caregiver Availability: 24/7 Discharge Plan Discussed with Primary Caregiver: Yes Is Caregiver In Agreement with Plan?: Yes Does Caregiver/Family have Issues with Lodging/Transportation while Pt is in Rehab?: No  Goals Patient/Family Goal for Rehab: Mod I to superivsion with PT, OT and SLP Expected length of stay: ELOS 10 to 14 days Pt/Family Agrees to Admission and willing to participate: Yes Program Orientation Provided & Reviewed with Pt/Caregiver Including Roles  & Responsibilities: Yes  Decrease burden of Care through IP rehab admission: n/a  Possible need for SNF placement upon discharge: not anticipated  Patient Condition: I have reviewed medical records from [facility], spoken with  patient and family member. I met with patient at the bedside for inpatient rehabilitation assessment.  Patient will benefit from ongoing PT, OT, and SLP, can actively participate in 3 hours of therapy a day 5 days of the week, and can make measurable gains during the admission.  Patient will also benefit from the coordinated team approach during an Inpatient Acute Rehabilitation admission.  The patient will receive intensive therapy as well as Rehabilitation physician, nursing, social worker, and care management interventions.  Due to bladder management, bowel management, safety, skin/wound care, disease management, medication administration, pain management, and patient education the patient requires 24 hour a day rehabilitation nursing.  The patient is currently min assist overall with mobility and basic ADLs.  Discharge setting and therapy post discharge at home with home health is anticipated.  Patient has agreed to participate in the Acute Inpatient Rehabilitation Program and will admit today.  Preadmission Screen Completed By:  Clois Dupes RN MSN 06/13/2023 11:50 AM ______________________________________________________________________   Discussed status with Dr.  Natale Lay on 06/13/23 at 1150 and received approval for admission today.  Admission Coordinator:  Clois Dupes, RN MSN time 1150 Date 06/13/23   Assessment/Plan: Diagnosis: left MCA infarct with left MCA M1 occlusion status post thrombectomy  Does the need for close, 24 hr/day Medical supervision in concert with the patient's rehab needs make it unreasonable for this patient to be served in a less intensive setting? Yes Co-Morbidities requiring supervision/potential complications: A-fib, hypertension, hyperlipidemia,\\ Due to bladder management, bowel management, safety, skin/wound care, disease management, medication administration, pain management, and patient education, does the patient require 24 hr/day rehab nursing? Yes Does the patient require coordinated care of a physician, rehab nurse, PT, OT, and SLP to address physical and functional deficits in the context of the above medical diagnosis(es)? Yes Addressing deficits in the following areas: balance, endurance, locomotion, strength, transferring, bowel/bladder control, bathing, dressing, feeding, grooming, toileting, cognition, speech, language, swallowing, and psychosocial support Can the patient actively participate in an intensive therapy program of at least 3 hrs of therapy 5 days a week? Yes The potential for patient to make measurable gains while on inpatient rehab is excellent Anticipated functional outcomes upon discharge from inpatient rehab: modified independent and supervision PT, modified independent and supervision OT, modified independent and supervision SLP Estimated rehab length of stay to reach the above functional goals is:  10-14 Anticipated discharge destination: Home 10. Overall Rehab/Functional Prognosis: excellent   MD Signature: [name]

## 2023-06-12 NOTE — Progress Notes (Signed)
*  PRELIMINARY RESULTS* Echocardiogram 2D Echocardiogram has been performed.  Sara Donaldson 06/12/2023, 8:47 AM

## 2023-06-12 NOTE — Progress Notes (Signed)
  Inpatient Rehabilitation Admissions Coordinator   Met with patient and granddaughter at bedside for rehab assessment. We discussed goals and expectations of a possible CIR admit. I also spoke with grandson by phone.They prefer CIR for rehab. Family can provide expected caregiver support that is recommended of supervision level. I await medical workup completion and then could pursue admit to CIR.They are in agreement. Please call me with any questions.   Ottie Glazier, RN, MSN Rehab Admissions Coordinator [phone]

## 2023-06-12 NOTE — Consult Note (Signed)
Ref: Orpah Cobb, MD   Subjective:  Awake. VS stable. Monitor shows atrial fibrillation with moderate ventricular response. Echocardiogram shows preserved LV systolic function with mild MR and moderate TR.  Objective:  Vital Signs in the last 24 hours: Temp:  [98 F (36.7 C)-99.2 F (37.3 C)] 98.3 F (36.8 C) (11/26 0754) Pulse Rate:  [68-120] 68 (11/26 0754) Cardiac Rhythm: Atrial fibrillation (11/25 2005) Resp:  [12-25] 21 (11/26 0854) BP: (90-143)/(54-100) 143/87 (11/26 0754) SpO2:  [91 %-98 %] 94 % (11/26 0754)  Physical Exam: BP Readings from Last 1 Encounters:  06/12/23 (!) 143/87     Wt Readings from Last 1 Encounters:  06/10/23 65.6 kg    Weight change:  Body mass index is 27.33 kg/m. HEENT: [place]/AT, Eyes-Blue, Conjunctiva-Pink, Sclera-Non-icteric Neck: No JVD, No bruit, Trachea midline. Lungs:  Clear, Bilateral. Cardiac:  Irregular rhythm, normal S1 and S2, no S3. II/VI systolic murmur. Abdomen:  Soft, non-tender. BS present. Extremities:  No edema present. No cyanosis. No clubbing. CNS: AxOx3, Cranial nerves grossly intact, moves all 4 extremities. Right sided weakness and thick speech. Skin: Warm and dry.   Intake/Output from previous day: 11/25 0701 - 11/26 0700 In: 362.9 [I.V.:362.9] Out: 200 [Urine:200]    Lab Results: BMET    Component Value Date/Time   NA 138 06/12/2023 0426   NA 135 06/11/2023 0538   NA 138 06/10/2023 1219   K 3.8 06/12/2023 0426   K 3.7 06/11/2023 0538   K 3.7 06/10/2023 1219   CL 105 06/12/2023 0426   CL 105 06/11/2023 0538   CL 99 06/10/2023 1219   CO2 24 06/12/2023 0426   CO2 22 06/11/2023 0538   CO2 26 06/10/2023 1219   GLUCOSE 104 (H) 06/12/2023 0426   GLUCOSE 112 (H) 06/11/2023 0538   GLUCOSE 153 (H) 06/10/2023 1219   BUN 14 06/12/2023 0426   BUN 9 06/11/2023 0538   BUN 13 06/10/2023 1219   CREATININE 0.65 06/12/2023 0426   CREATININE 0.59 06/11/2023 0538   CREATININE 0.66 06/10/2023 1616   CALCIUM 9.0  06/12/2023 0426   CALCIUM 8.5 (L) 06/11/2023 0538   CALCIUM 9.4 06/10/2023 1219   GFRNONAA >60 06/12/2023 0426   GFRNONAA >60 06/11/2023 0538   GFRNONAA >60 06/10/2023 1616   GFRAA >60 06/08/2018 1632   GFRAA >60 05/27/2018 0925   GFRAA >90 03/07/2014 0410   CBC    Component Value Date/Time   WBC 8.0 06/12/2023 0426   RBC 4.46 06/12/2023 0426   HGB 13.0 06/12/2023 0426   HCT 40.7 06/12/2023 0426   PLT 130 (L) 06/12/2023 0426   MCV 91.3 06/12/2023 0426   MCH 29.1 06/12/2023 0426   MCHC 31.9 06/12/2023 0426   RDW 13.4 06/12/2023 0426   LYMPHSABS 1.0 06/11/2023 0538   MONOABS 0.9 06/11/2023 0538   EOSABS 0.0 06/11/2023 0538   BASOSABS 0.0 06/11/2023 0538   HEPATIC Function Panel Recent Labs    06/10/23 1219  PROT 7.5  ALBUMIN 4.1  AST 31  ALT 19  ALKPHOS 67   HEMOGLOBIN A1C Lab Results  Component Value Date   MPG 108.28 06/10/2023   CARDIAC ENZYMES No results found for: "CKTOTAL", "CKMB", "CKMBINDEX", "TROPONINI" BNP No results for input(s): "PROBNP" in the last 8760 hours. TSH No results for input(s): "TSH" in the last 8760 hours. CHOLESTEROL Recent Labs    06/11/23 0538  CHOL 117    Scheduled Meds:  aspirin EC  325 mg Oral Daily   Chlorhexidine Gluconate Cloth  6 each Topical Daily   diltiazem  60 mg Oral QID   enoxaparin (LOVENOX) injection  40 mg Subcutaneous Q24H   metoprolol tartrate  25 mg Oral BID   pravastatin  40 mg Oral q1800   sodium chloride flush  3 mL Intravenous Once   Continuous Infusions: PRN Meds:.acetaminophen **OR** acetaminophen (TYLENOL) oral liquid 160 mg/5 mL **OR** acetaminophen, ALPRAZolam, mouth rinse, senna-docusate  Assessment/Plan: Acute left MCA stroke, embolic Atrial fibrillation, [id number] score of 6 HTN HLD Obesity S/P AV replacement, 21 mm Edwards Pericardial  Plan: Decrease heart rate with metoprolol and change amlodipine to diltiazem. Consider low dose Eliquis when Okay with neurology.   LOS: 2 days    Time spent including chart review, lab review, examination, discussion with patient/Family/Nurse : 30 min   Orpah Cobb  MD  06/12/2023, 9:29 AM

## 2023-06-12 NOTE — TOC CM/SW Note (Signed)
Transition of Care [facility]) - Inpatient Brief Assessment   Patient Details  Name: RHAYNE LIMES MRN: [id number] Date of Birth: [birth year]  Transition of Care [facility]) CM/SW Contact:    Kermit Balo, RN Phone Number: 06/12/2023, 2:00 PM   Clinical Narrative:  CIR following for medical readiness.  TOC following.  Transition of Care Asessment: Insurance and Status: Insurance coverage has been reviewed Patient has primary care physician: Yes Home environment has been reviewed: home alone   Prior/Current Home Services: No current home services Social Determinants of Health Reivew: SDOH reviewed no interventions necessary Readmission risk has been reviewed: Yes Transition of care needs: transition of care needs identified, TOC will continue to follow

## 2023-06-12 NOTE — Progress Notes (Signed)
PHARMACY - ANTICOAGULATION CONSULT NOTE  Pharmacy Consult for Eliquis Indication: atrial fibrillation  Allergies  Allergen Reactions   Silicone Other (See Comments)    Adhesive tape causes blisters; use paper tape only   Tape Other (See Comments)    Adhesive tape causes blisters; use paper tape only   Trazodone And Nefazodone Hives    Patient Measurements: Height: 5\' 1"  (154.9 cm) Weight: 65.6 kg (144 lb 10 oz) IBW/kg (Calculated) : 47.8  Vital Signs: Temp: 98.8 F (37.1 C) (11/26 1545) Temp Source: Oral (11/26 1545) BP: 124/63 (11/26 1545) Pulse Rate: 102 (11/26 1545)  Labs: Recent Labs    06/10/23 1219 06/10/23 1616 06/11/23 0538 06/12/23 0426  HGB 15.4* 14.3 13.0 13.0  HCT 47.3* 43.0 40.8 40.7  PLT 187 180 187 130*  APTT 24  --   --   --   LABPROT 14.0  --   --   --   INR 1.1  --   --   --   CREATININE 0.66 0.66 0.59 0.65    Estimated Creatinine Clearance: 39.7 mL/min (by C-G formula based on SCr of 0.65 mg/dL).   Medical History: Past Medical History:  Diagnosis Date   Aortic stenosis    Bronchitis    Heart murmur    Hypercholesterolemia    Hypertension    Thrombocytopenia (HCC) 03/05/2014    Medications:  Scheduled:   aspirin EC  325 mg Oral Daily   Chlorhexidine Gluconate Cloth  6 each Topical Daily   diltiazem  60 mg Oral QID   enoxaparin (LOVENOX) injection  40 mg Subcutaneous Q24H   metoprolol tartrate  25 mg Oral BID   pravastatin  40 mg Oral q1800   sodium chloride flush  3 mL Intravenous Once   Infusions:  PRN: acetaminophen **OR** acetaminophen (TYLENOL) oral liquid 160 mg/5 mL **OR** acetaminophen, ALPRAZolam, mouth rinse, senna-docusate  Assessment: 89 yo female admitted with stroke s/p MT TICI3. Pharmacy consulted to start apixaban for newly diagnosed afib.   Goal of Therapy:  Therapeutic anticoagulation   Plan:  Start Eliquis 2.5mg  PO BID - low dose specified by Cards/Neuro Monitor CBC, signs/symptoms of bleeding Will  provide education and co-pay check prior to discharge  Loralee Pacas, PharmD, BCPS 06/12/2023,4:28 PM  Please check AMION for all [facility] Pharmacy phone numbers After 10:00 PM, call Main Pharmacy [phone]

## 2023-06-12 NOTE — Plan of Care (Signed)
Problem: Ischemic Stroke/TIA Tissue Perfusion: Goal: Complications of ischemic stroke/TIA will be minimized Outcome: Progressing   Problem: Coping: Goal: Will verbalize positive feelings about self Outcome: Progressing Goal: Will identify appropriate support needs Outcome: Progressing   Problem: Health Behavior/Discharge Planning: Goal: Ability to manage health-related needs will improve Outcome: Progressing Goal: Goals will be collaboratively established with patient/family Outcome: Progressing   Problem: Self-Care: Goal: Ability to participate in self-care as condition permits will improve Outcome: Progressing Goal: Verbalization of feelings and concerns over difficulty with self-care will improve Outcome: Progressing Goal: Ability to communicate needs accurately will improve Outcome: Progressing   Problem: Nutrition: Goal: Risk of aspiration will decrease Outcome: Progressing Goal: Dietary intake will improve Outcome: Progressing

## 2023-06-13 ENCOUNTER — Other Ambulatory Visit: Payer: Self-pay

## 2023-06-13 ENCOUNTER — Other Ambulatory Visit (HOSPITAL_COMMUNITY): Payer: Self-pay

## 2023-06-13 ENCOUNTER — Encounter (HOSPITAL_COMMUNITY): Payer: Self-pay | Admitting: Physical Medicine & Rehabilitation

## 2023-06-13 ENCOUNTER — Inpatient Hospital Stay (HOSPITAL_COMMUNITY)
Admission: AD | Admit: 2023-06-13 | Discharge: 2023-06-22 | DRG: 057 | Disposition: A | Discharge status: Home Health | Payer: Medicare Other | Source: Intra-hospital | Attending: Physical Medicine & Rehabilitation | Admitting: Physical Medicine & Rehabilitation

## 2023-06-13 DIAGNOSIS — I4891 Unspecified atrial fibrillation: Secondary | ICD-10-CM | POA: Diagnosis not present

## 2023-06-13 DIAGNOSIS — E785 Hyperlipidemia, unspecified: Secondary | ICD-10-CM

## 2023-06-13 DIAGNOSIS — Z6827 Body mass index (BMI) 27.0-27.9, adult: Secondary | ICD-10-CM | POA: Diagnosis not present

## 2023-06-13 DIAGNOSIS — Z888 Allergy status to other drugs, medicaments and biological substances status: Secondary | ICD-10-CM

## 2023-06-13 DIAGNOSIS — Z7901 Long term (current) use of anticoagulants: Secondary | ICD-10-CM

## 2023-06-13 DIAGNOSIS — I480 Paroxysmal atrial fibrillation: Secondary | ICD-10-CM | POA: Diagnosis present

## 2023-06-13 DIAGNOSIS — I10 Essential (primary) hypertension: Secondary | ICD-10-CM

## 2023-06-13 DIAGNOSIS — Z952 Presence of prosthetic heart valve: Secondary | ICD-10-CM | POA: Diagnosis not present

## 2023-06-13 DIAGNOSIS — I69322 Dysarthria following cerebral infarction: Secondary | ICD-10-CM | POA: Diagnosis not present

## 2023-06-13 DIAGNOSIS — I4819 Other persistent atrial fibrillation: Secondary | ICD-10-CM | POA: Diagnosis not present

## 2023-06-13 DIAGNOSIS — I7090 Unspecified atherosclerosis: Secondary | ICD-10-CM | POA: Insufficient documentation

## 2023-06-13 DIAGNOSIS — I63512 Cerebral infarction due to unspecified occlusion or stenosis of left middle cerebral artery: Principal | ICD-10-CM | POA: Diagnosis present

## 2023-06-13 DIAGNOSIS — I69392 Facial weakness following cerebral infarction: Secondary | ICD-10-CM | POA: Diagnosis not present

## 2023-06-13 DIAGNOSIS — F5104 Psychophysiologic insomnia: Secondary | ICD-10-CM | POA: Diagnosis present

## 2023-06-13 DIAGNOSIS — E669 Obesity, unspecified: Secondary | ICD-10-CM | POA: Diagnosis present

## 2023-06-13 DIAGNOSIS — I482 Chronic atrial fibrillation, unspecified: Secondary | ICD-10-CM | POA: Diagnosis not present

## 2023-06-13 DIAGNOSIS — I6523 Occlusion and stenosis of bilateral carotid arteries: Secondary | ICD-10-CM | POA: Diagnosis present

## 2023-06-13 DIAGNOSIS — Z8249 Family history of ischemic heart disease and other diseases of the circulatory system: Secondary | ICD-10-CM

## 2023-06-13 DIAGNOSIS — Z7982 Long term (current) use of aspirin: Secondary | ICD-10-CM

## 2023-06-13 DIAGNOSIS — I69351 Hemiplegia and hemiparesis following cerebral infarction affecting right dominant side: Principal | ICD-10-CM

## 2023-06-13 DIAGNOSIS — F419 Anxiety disorder, unspecified: Secondary | ICD-10-CM | POA: Diagnosis present

## 2023-06-13 DIAGNOSIS — I69320 Aphasia following cerebral infarction: Secondary | ICD-10-CM

## 2023-06-13 DIAGNOSIS — Z79899 Other long term (current) drug therapy: Secondary | ICD-10-CM | POA: Diagnosis not present

## 2023-06-13 DIAGNOSIS — E7800 Pure hypercholesterolemia, unspecified: Secondary | ICD-10-CM | POA: Diagnosis present

## 2023-06-13 DIAGNOSIS — H53461 Homonymous bilateral field defects, right side: Secondary | ICD-10-CM | POA: Diagnosis present

## 2023-06-13 LAB — CBC
HCT: 39.9 % (ref 36.0–46.0)
Hemoglobin: 12.9 g/dL (ref 12.0–15.0)
MCH: 29.3 pg (ref 26.0–34.0)
MCHC: 32.3 g/dL (ref 30.0–36.0)
MCV: 90.7 fL (ref 80.0–100.0)
Platelets: 156 10*3/uL (ref 150–400)
RBC: 4.4 MIL/uL (ref 3.87–5.11)
RDW: 13.4 % (ref 11.5–15.5)
WBC: 6.4 10*3/uL (ref 4.0–10.5)
nRBC: 0 % (ref 0.0–0.2)

## 2023-06-13 LAB — BASIC METABOLIC PANEL
Anion gap: 10 (ref 5–15)
BUN: 9 mg/dL (ref 8–23)
CO2: 24 mmol/L (ref 22–32)
Calcium: 9 mg/dL (ref 8.9–10.3)
Chloride: 104 mmol/L (ref 98–111)
Creatinine, Ser: 0.61 mg/dL (ref 0.44–1.00)
GFR, Estimated: >60 mL/min (ref >60)
Glucose, Bld: 112 mg/dL — ABNORMAL HIGH (ref 70–99)
Potassium: 3.9 mmol/L (ref 3.5–5.1)
Sodium: 138 mmol/L (ref 135–145)

## 2023-06-13 MED ORDER — SENNOSIDES-DOCUSATE SODIUM 8.6-50 MG PO TABS: 1.0000 | ORAL_TABLET | Freq: Every evening | ORAL | Status: DC | PRN

## 2023-06-13 MED ORDER — PRAVASTATIN SODIUM 40 MG PO TABS
40.0000 mg | ORAL_TABLET | Freq: Every day | ORAL | Status: DC
  Administered 2023-06-13 – 2023-06-21 (×9): 40 mg via ORAL
  Filled 2023-06-13 (×9): qty 1

## 2023-06-13 MED ORDER — METOPROLOL TARTRATE 12.5 MG HALF TABLET
25.0000 mg | ORAL_TABLET | Freq: Two times a day (BID) | ORAL | Status: DC
  Administered 2023-06-13 – 2023-06-14 (×2): 25 mg via ORAL
  Filled 2023-06-13 (×2): qty 2

## 2023-06-13 MED ORDER — ACETAMINOPHEN 160 MG/5ML PO SOLN: 650.0000 mg | ORAL | Status: DC | PRN

## 2023-06-13 MED ORDER — APIXABAN 2.5 MG PO TABS
2.5000 mg | ORAL_TABLET | Freq: Two times a day (BID) | ORAL | Status: DC
  Administered 2023-06-13 – 2023-06-22 (×18): 2.5 mg via ORAL
  Filled 2023-06-13 (×18): qty 1

## 2023-06-13 MED ORDER — ASPIRIN 81 MG PO TBEC: 81.0000 mg | DELAYED_RELEASE_TABLET | Freq: Every day | ORAL | Status: AC

## 2023-06-13 MED ORDER — ALPRAZOLAM 0.25 MG PO TABS
0.2500 mg | ORAL_TABLET | Freq: Every evening | ORAL | Status: DC | PRN
  Administered 2023-06-13: 0.25 mg via ORAL
  Filled 2023-06-13: qty 1

## 2023-06-13 MED ORDER — APIXABAN 2.5 MG PO TABS: 2.5000 mg | ORAL_TABLET | Freq: Two times a day (BID) | ORAL | Status: DC

## 2023-06-13 MED ORDER — DILTIAZEM HCL 60 MG PO TABS: 60.0000 mg | ORAL_TABLET | Freq: Four times a day (QID) | ORAL | Status: DC

## 2023-06-13 MED ORDER — ASPIRIN 81 MG PO TBEC
81.0000 mg | DELAYED_RELEASE_TABLET | Freq: Every day | ORAL | Status: DC
  Administered 2023-06-14 – 2023-06-22 (×9): 81 mg via ORAL
  Filled 2023-06-13 (×9): qty 1

## 2023-06-13 MED ORDER — ACETAMINOPHEN 325 MG PO TABS: 650.0000 mg | ORAL_TABLET | ORAL | Status: DC | PRN

## 2023-06-13 MED ORDER — PRAVASTATIN SODIUM 40 MG PO TABS: 40.0000 mg | ORAL_TABLET | Freq: Every day | ORAL | Status: DC

## 2023-06-13 MED ORDER — ACETAMINOPHEN 650 MG RE SUPP: 650.0000 mg | RECTAL | Status: DC | PRN

## 2023-06-13 MED ORDER — DILTIAZEM HCL 30 MG PO TABS
60.0000 mg | ORAL_TABLET | Freq: Four times a day (QID) | ORAL | Status: DC
  Administered 2023-06-13 – 2023-06-14 (×2): 60 mg via ORAL
  Filled 2023-06-13 (×3): qty 2

## 2023-06-13 MED ORDER — DILTIAZEM HCL 30 MG PO TABS: 60.0000 mg | ORAL_TABLET | Freq: Four times a day (QID) | ORAL | Status: DC

## 2023-06-13 NOTE — Progress Notes (Signed)
Physical Therapy Treatment Patient Details Name: Sara Donaldson MRN: [id number] DOB: [birth year] Today's Date: 06/13/2023   History of Present Illness 89 y.o. female s/p L MCA stroke.  complete revascularization 06/10/23. PMHx CAD, AV replacement in 02/2014, HLD and obesity.    PT Comments  Pt greeted up in chair on arrival and demonstrating continued progress towards acute goals, however continues to be limited by R inattention, decreased activity tolerance and impaired balance/postural reactions. Pt with improved attention to R environment during gait at start of session however with increased distance and fatigue pt drifting R and bumping door frame to room on return. Pt with noted increase in R drift with dual tasking. Educated pt on limiting distractions and maintaining focus during gait for safety, with pt verbalizing understanding. Current plan remains appropriate to address deficits and maximize functional independence and decrease caregiver burden. Pt continues to benefit from skilled PT services to progress toward functional mobility goals.      If plan is discharge home, recommend the following: A little help with walking and/or transfers;A little help with bathing/dressing/bathroom;Assistance with feeding;Help with stairs or ramp for entrance;Assist for transportation;Assistance with cooking/housework   Can travel by private vehicle        Equipment Recommendations  Other (comment) (TBA)    Recommendations for Other Services       Precautions / Restrictions Precautions Precautions: Fall Precaution Comments: R inattention Restrictions Weight Bearing Restrictions: No     Mobility  Bed Mobility Overal bed mobility: Needs Assistance Bed Mobility: Sit to Supine       Sit to supine: Min assist, Contact guard assist   General bed mobility comments: CGA to return to supine, min A to position in bed    Transfers Overall transfer level: Needs assistance Equipment used: 1  person hand held assist, Straight cane Transfers: Sit to/from Stand, Bed to chair/wheelchair/BSC Sit to Stand: Min assist           General transfer comment: light min A to steady on rise    Ambulation/Gait Ambulation/Gait assistance: Min assist Gait Distance (Feet): 125 Feet Assistive device: Straight cane Gait Pattern/deviations: Step-through pattern, Decreased stride length, Trunk flexed Gait velocity: decr     General Gait Details: improved attention to R with cues at start, with increased distance and dual tasking pt bumping door frame on return to room   Stairs             Wheelchair Mobility     Tilt Bed    Modified Rankin (Stroke Patients Only)       Balance Overall balance assessment: Needs assistance Sitting-balance support: Feet supported Sitting balance-Leahy Scale: Fair Sitting balance - Comments: sitting EOB and on toilet in bathroom   Standing balance support: Single extremity supported, During functional activity Standing balance-Leahy Scale: Poor Standing balance comment: Required at least 1 hand held assist to maintain balance. Pt also reaching for footboard of bed when walking by for additional support.                            Cognition Arousal: Alert Behavior During Therapy: WFL for tasks assessed/performed Overall Cognitive Status: Impaired/Different from baseline Area of Impairment: Attention, Memory, Following commands, Safety/judgement, Awareness, Problem solving                   Current Attention Level: Focused Memory: Decreased short-term memory Following Commands: Follows one step commands inconsistently, Follows one step commands with increased  time Safety/Judgement: Decreased awareness of safety, Decreased awareness of deficits Awareness: Intellectual Problem Solving: Slow processing, Decreased initiation, Difficulty sequencing, Requires verbal cues General Comments: easily distracted, R innattention         Exercises      General Comments General comments (skin integrity, edema, etc.): VSS      Pertinent Vitals/Pain Pain Assessment Pain Assessment: No/denies pain    Home Living                          Prior Function            PT Goals (current goals can now be found in the care plan section) Acute Rehab PT Goals Patient Stated Goal: return home after rehab PT Goal Formulation: With patient/family Time For Goal Achievement: 06/25/23 Progress towards PT goals: Progressing toward goals    Frequency    Min 1X/week      PT Plan      Co-evaluation              AM-PAC PT "6 Clicks" Mobility   Outcome Measure  Help needed turning from your back to your side while in a flat bed without using bedrails?: A Little Help needed moving from lying on your back to sitting on the side of a flat bed without using bedrails?: A Little Help needed moving to and from a bed to a chair (including a wheelchair)?: A Little Help needed standing up from a chair using your arms (e.g., wheelchair or bedside chair)?: A Little Help needed to walk in hospital room?: A Little Help needed climbing 3-5 steps with a railing? : Total 6 Click Score: 16    End of Session Equipment Utilized During Treatment: Gait belt Activity Tolerance: Patient tolerated treatment well;Patient limited by fatigue Patient left: with call bell/phone within reach;in bed;with bed alarm set Nurse Communication: Mobility status PT Visit Diagnosis: Other abnormalities of gait and mobility (R26.89);Other symptoms and signs involving the nervous system (R29.898)     Time: 1914-7829 PT Time Calculation (min) (ACUTE ONLY): 20 min  Charges:    $Gait Training: 8-22 mins PT General Charges $$ ACUTE PT VISIT: 1 Visit                     Willett Lefeber R. PTA Acute Rehabilitation Services Office: [phone]   Catalina Antigua 06/13/2023, 11:21 AM

## 2023-06-13 NOTE — Progress Notes (Signed)
Occupational Therapy Treatment Patient Details Name: Sara Donaldson MRN: [id number] DOB: 1929-09-14 Today's Date: 06/13/2023   History of present illness 89 y.o. female s/p L MCA stroke.  complete revascularization 06/10/23. PMHx CAD, AV replacement in 02/2014, HLD and obesity.   OT comments  Pt is making good progress towards their acute OT goals. Pt continues to be limited by R inattention, R hemibody weakness and impaired cognition. Overall pt needed up to min A for mobility with a SPC due to R drift with pt running into objects in R environment. Scanning activities utilized to facilitate R awareness with notable improvement. OT to continue to follow acutely to facilitate progress towards established goals. Pt will continue to benefit from intensive inpatient follow up therapy, >3 hours/day after discharge.       If plan is discharge home, recommend the following:  A lot of help with walking and/or transfers;A lot of help with bathing/dressing/bathroom;Assistance with cooking/housework;Direct supervision/assist for financial management;Direct supervision/assist for medications management;Assist for transportation;Supervision due to cognitive status   Equipment Recommendations  Other (comment)    Recommendations for Other Services Rehab consult    Precautions / Restrictions Precautions Precautions: Fall Precaution Comments: R inattention Restrictions Weight Bearing Restrictions: No       Mobility Bed Mobility Overal bed mobility: Needs Assistance Bed Mobility: Sit to Supine, Supine to Sit     Supine to sit: Contact guard Sit to supine: Min assist        Transfers Overall transfer level: Needs assistance Equipment used: 1 person hand held assist, Straight cane Transfers: Sit to/from Stand, Bed to chair/wheelchair/BSC Sit to Stand: Min assist                 Balance Overall balance assessment: Needs assistance Sitting-balance support: Feet supported Sitting  balance-Leahy Scale: Fair     Standing balance support: Single extremity supported, During functional activity Standing balance-Leahy Scale: Poor                             ADL either performed or assessed with clinical judgement   ADL Overall ADL's : Needs assistance/impaired                         Toilet Transfer: Minimal assistance;Ambulation Toilet Transfer Details (indicate cue type and reason): with SPC, needs min A for R inattention         Functional mobility during ADLs: Minimal assistance;Cane General ADL Comments: cues for safety and R environmnet    Extremity/Trunk Assessment Upper Extremity Assessment Upper Extremity Assessment: RUE deficits/detail RUE Deficits / Details: A/ROM WFL. overall strength: 4/5. poor coordination RUE Coordination: decreased gross motor;decreased fine motor   Lower Extremity Assessment Lower Extremity Assessment: Defer to PT evaluation        Vision   Vision Assessment?: Yes Additional Comments: R inattention   Perception Perception Perception: Impaired Perception-Other Comments: R inattention   Praxis Praxis Praxis: Impaired    Cognition Arousal: Alert Behavior During Therapy: WFL for tasks assessed/performed Overall Cognitive Status: Impaired/Different from baseline Area of Impairment: Attention, Memory, Following commands, Safety/judgement, Awareness, Problem solving                   Current Attention Level: Focused Memory: Decreased short-term memory Following Commands: Follows one step commands inconsistently, Follows one step commands with increased time Safety/Judgement: Decreased awareness of safety, Decreased awareness of deficits Awareness: Intellectual Problem Solving: Slow processing,  Decreased initiation, Difficulty sequencing, Requires verbal cues General Comments: easily distracted, R inattention, limited insight              General Comments VSS    Pertinent Vitals/  Pain       Pain Assessment Pain Assessment: No/denies pain   Frequency  Min 1X/week        Progress Toward Goals  OT Goals(current goals can now be found in the care plan section)  Progress towards OT goals: Progressing toward goals  Acute Rehab OT Goals Patient Stated Goal: to go to [facility] OT Goal Formulation: With patient Time For Goal Achievement: 06/25/23 Potential to Achieve Goals: Good ADL Goals Pt Will Perform Eating: with modified independence;sitting Pt Will Perform Grooming: with supervision;standing Pt Will Perform Upper Body Bathing: with supervision;sitting Pt Will Perform Lower Body Dressing: with supervision;sit to/from stand;sitting/lateral leans Pt Will Transfer to Toilet: with supervision;ambulating;regular height toilet;grab bars Pt Will Perform Toileting - Clothing Manipulation and hygiene: with supervision;sit to/from stand;sitting/lateral leans Pt/caregiver will Perform Home Exercise Program: Increased strength;Right Upper extremity;With theraputty;With Supervision;With written HEP provided Additional ADL Goal #1: Pt will demonstrate improved right-side awareness by turning their head and visually scanning to locate items placed on their right side in 3 out 5 trials with minimal verbal or tactile cues, to support engagement in self care tasks.   AM-PAC OT "6 Clicks" Daily Activity     Outcome Measure   Help from another person eating meals?: A Little Help from another person taking care of personal grooming?: A Little Help from another person toileting, which includes using toliet, bedpan, or urinal?: A Little Help from another person bathing (including washing, rinsing, drying)?: A Lot Help from another person to put on and taking off regular upper body clothing?: A Little Help from another person to put on and taking off regular lower body clothing?: A Lot 6 Click Score: 16    End of Session Equipment Utilized During Treatment: Gait belt  OT Visit  Diagnosis: Unsteadiness on feet (R26.81);Muscle weakness (generalized) (M62.81);Apraxia (R48.2);Other symptoms and signs involving cognitive function;Hemiplegia and hemiparesis Hemiplegia - Right/Left: Right Hemiplegia - dominant/non-dominant: Dominant Hemiplegia - caused by: Cerebral infarction   Activity Tolerance Patient tolerated treatment well   Patient Left with family/visitor present;in bed;with bed alarm set   Nurse Communication Mobility status;Other (comment)        Time: 5284-1324 OT Time Calculation (min): 27 min  Charges: OT General Charges $OT Visit: 1 Visit OT Treatments $Therapeutic Activity: 23-37 mins  Derenda Mis, OTR/L Acute Rehabilitation Services Office [phone] Secure Chat Communication Preferred   Donia Pounds 06/13/2023, 2:53 PM

## 2023-06-13 NOTE — Discharge Summary (Addendum)
Stroke Discharge Summary  Patient ID: Sara Donaldson   MRN: [id number]      DOB: May 04, 1930  Date of Admission: 06/10/2023 Date of Discharge: 06/13/2023  Attending Physician:  Stroke, Md, MD, Stroke MD Consultant(s):  Treatment Team:  Orpah Cobb, MD cardiology Patient's PCP:  Orpah Cobb, MD  Discharge Diagnoses:  Stroke:  left MCA infarct with left MCA M1 occlusion s/p IR with TICI 3, etiology:  cardioembolic in the setting of newly diagnosed A fib    Active Problems:   HTN   Atrial fibrillation (HCC), new diagnosis   HLD   Atheromatous plaque   Medications to be continued on Rehab Allergies as of 06/13/2023       Reactions   Silicone Other (See Comments)   Adhesive tape causes blisters; use paper tape only   Tape Other (See Comments)   Adhesive tape causes blisters; use paper tape only   Trazodone And Nefazodone Hives        Medication List     STOP taking these medications    ascorbic acid 250 MG Chew Commonly known as: VITAMIN C   CALCIUM 500 + D3 PO   doxylamine (Sleep) 25 MG tablet Commonly known as: UNISOM   furosemide 20 MG tablet Commonly known as: LASIX   ibuprofen 200 MG tablet Commonly known as: ADVIL   losartan 100 MG tablet Commonly known as: COZAAR   lovastatin 40 MG tablet Commonly known as: MEVACOR Replaced by: pravastatin 40 MG tablet   omeprazole 20 MG capsule Commonly known as: PRILOSEC   potassium chloride 10 MEQ tablet Commonly known as: KLOR-CON   VITAMIN E PO   ZZZQUIL PO       TAKE these medications    acetaminophen 500 MG tablet Commonly known as: TYLENOL Take 1,000 mg by mouth 2 (two) times daily as needed for moderate pain (pain score 4-6), fever or headache.   ALPRAZolam 0.5 MG tablet Commonly known as: XANAX Take 0.25 mg by mouth at bedtime as needed.   apixaban 2.5 MG Tabs tablet Commonly known as: ELIQUIS Take 1 tablet (2.5 mg total) by mouth 2 (two) times daily.   aspirin EC 81 MG  tablet Take 1 tablet (81 mg total) by mouth daily. Swallow whole. Start taking on: June 14, 2023 What changed: additional instructions   diltiazem 60 MG tablet Commonly known as: CARDIZEM Take 1 tablet (60 mg total) by mouth 4 (four) times daily.   HAIR SKIN & NAILS GUMMIES PO Take 1 each by mouth daily.   metoprolol tartrate 50 MG tablet Commonly known as: LOPRESSOR Take 50 mg by mouth 2 (two) times daily.   pravastatin 40 MG tablet Commonly known as: PRAVACHOL Take 1 tablet (40 mg total) by mouth daily at 6 PM. Replaces: lovastatin 40 MG tablet   senna-docusate 8.6-50 MG tablet Commonly known as: Senokot-S Take 1 tablet by mouth at bedtime as needed for mild constipation or moderate constipation.   Womens 50+ Multi Vitamin Tabs Take 1 tablet by mouth daily.        LABORATORY STUDIES CBC    Component Value Date/Time   WBC 6.4 06/13/2023 0519   RBC 4.40 06/13/2023 0519   HGB 12.9 06/13/2023 0519   HCT 39.9 06/13/2023 0519   PLT 156 06/13/2023 0519   MCV 90.7 06/13/2023 0519   MCH 29.3 06/13/2023 0519   MCHC 32.3 06/13/2023 0519   RDW 13.4 06/13/2023 0519   LYMPHSABS 1.0 06/11/2023 0538  MONOABS 0.9 06/11/2023 0538   EOSABS 0.0 06/11/2023 0538   BASOSABS 0.0 06/11/2023 0538   CMP    Component Value Date/Time   NA 138 06/13/2023 0519   K 3.9 06/13/2023 0519   CL 104 06/13/2023 0519   CO2 24 06/13/2023 0519   GLUCOSE 112 (H) 06/13/2023 0519   BUN 9 06/13/2023 0519   CREATININE 0.61 06/13/2023 0519   CALCIUM 9.0 06/13/2023 0519   PROT 7.5 06/10/2023 1219   ALBUMIN 4.1 06/10/2023 1219   AST 31 06/10/2023 1219   ALT 19 06/10/2023 1219   ALKPHOS 67 06/10/2023 1219   BILITOT 0.6 06/10/2023 1219   GFRNONAA >60 06/13/2023 0519   GFRAA >60 06/08/2018 1632   COAGS Lab Results  Component Value Date   INR 1.1 06/10/2023   INR 1.16 03/05/2014   INR 1.93 (H) 03/02/2014   Lipid Panel    Component Value Date/Time   CHOL 117 06/11/2023 0538   TRIG 75  06/11/2023 0538   HDL 52 06/11/2023 0538   CHOLHDL 2.3 06/11/2023 0538   VLDL 15 06/11/2023 0538   LDLCALC 50 06/11/2023 0538   HgbA1C  Lab Results  Component Value Date   HGBA1C 5.4 06/10/2023   Urinalysis    Component Value Date/Time   COLORURINE YELLOW (A) 06/08/2018 1632   APPEARANCEUR CLEAR (A) 06/08/2018 1632   LABSPEC 1.019 06/08/2018 1632   PHURINE 5.0 06/08/2018 1632   GLUCOSEU NEGATIVE 06/08/2018 1632   HGBUR NEGATIVE 06/08/2018 1632   BILIRUBINUR NEGATIVE 06/08/2018 1632   KETONESUR 5 (A) 06/08/2018 1632   PROTEINUR NEGATIVE 06/08/2018 1632   UROBILINOGEN 0.2 02/26/2014 1546   NITRITE NEGATIVE 06/08/2018 1632   LEUKOCYTESUR TRACE (A) 06/08/2018 1632   Urine Drug Screen No results found for: "LABOPIA", "COCAINSCRNUR", "LABBENZ", "AMPHETMU", "THCU", "LABBARB"  Alcohol Level    Component Value Date/Time   ETH <10 06/10/2023 1220     SIGNIFICANT DIAGNOSTIC STUDIES ECHOCARDIOGRAM COMPLETE  Result Date: 06/12/2023    ECHOCARDIOGRAM REPORT   Patient Name:   CHAZMIN GANA Date of Exam: 06/12/2023 Medical Rec #:  [id number]    Height:       61.0 in Accession #:    [id number]   Weight:       144.6 lb Date of Birth:  [birth year]    BSA:          1.646 m Patient Age:    89 years     BP:           105/60 mmHg Patient Gender: F            HR:           115 bpm. Exam Location:  Inpatient Procedure: 2D Echo Indications:     I48.0 Paroxysmal atrial fibrillation  History:         Patient has prior history of Echocardiogram examinations. Prior                  Cardiac Surgery, Stroke, Aortic Valve Disease and Mitral Valve                  Disease, Arrythmias:Atrial Fibrillation,                  Signs/Symptoms:Hypertensive Heart Disease; Risk                  Factors:Hypertension.                  Aortic Valve: 21 mm  Edwards pericardial valve is present in the                  aortic position.  Sonographer:     Dondra Prader RVT Referring Phys:  Terrilee Files [facility] Diagnosing Phys: Orpah Cobb MD IMPRESSIONS  1. Left ventricular ejection fraction, by estimation, is 55 to 60%. The left ventricle has normal function. The left ventricle has no regional wall motion abnormalities. Left ventricular diastolic parameters are indeterminate.  2. Right ventricular systolic function is low normal. The right ventricular size is normal.  3. Left atrial size was moderately dilated.  4. Right atrial size was moderately dilated.  5. The mitral valve is degenerative. Mild to moderate mitral valve regurgitation. Moderate mitral annular calcification.  6. Tricuspid valve regurgitation is moderate to severe.  7. The aortic valve has been repaired/replaced. There is mild calcification of the aortic valve. There is mild thickening of the aortic valve. Aortic valve regurgitation is not visualized. Aortic valve sclerosis is present, with no evidence of aortic valve stenosis. There is a 21 mm Edwards pericardial valve present in the aortic position.  8. There is Moderate (Grade III) atheroma plaque involving the aortic root and ascending aorta.  9. The inferior vena cava is dilated in size with <50% respiratory variability, suggesting right atrial pressure of 15 mmHg. FINDINGS  Left Ventricle: Left ventricular ejection fraction, by estimation, is 55 to 60%. The left ventricle has normal function. The left ventricle has no regional wall motion abnormalities. The left ventricular internal cavity size was normal in size. There is  borderline concentric left ventricular hypertrophy. Left ventricular diastolic parameters are indeterminate. Right Ventricle: The right ventricular size is normal. No increase in right ventricular wall thickness. Right ventricular systolic function is low normal. Left Atrium: Left atrial size was moderately dilated. Right Atrium: Right atrial size was moderately dilated. Pericardium: There is no evidence of pericardial effusion. Mitral Valve: The mitral valve is degenerative in appearance. Moderate  mitral annular calcification. Mild to moderate mitral valve regurgitation. Tricuspid Valve: The tricuspid valve is normal in structure. Tricuspid valve regurgitation is moderate to severe. Aortic Valve: The aortic valve has been repaired/replaced. There is mild calcification of the aortic valve. There is mild thickening of the aortic valve. There is moderate to severe aortic valve annular calcification. Aortic valve regurgitation is not visualized. Aortic valve sclerosis is present, with no evidence of aortic valve stenosis. Aortic valve mean gradient measures 5.0 mmHg. Aortic valve peak gradient measures 9.5 mmHg. Aortic valve area, by VTI measures 2.19 cm. There is a 21 mm Edwards pericardial valve present in the aortic position. Pulmonic Valve: The pulmonic valve was normal in structure. Pulmonic valve regurgitation is not visualized. Aorta: The aortic root is normal in size and structure. There is moderate (Grade III) atheroma plaque involving the aortic root and ascending aorta. Venous: The inferior vena cava is dilated in size with less than 50% respiratory variability, suggesting right atrial pressure of 15 mmHg. IAS/Shunts: The atrial septum is grossly normal.  LEFT VENTRICLE PLAX 2D LVIDd:         3.80 cm LVIDs:         2.60 cm LV PW:         0.90 cm LV IVS:        1.10 cm LVOT diam:     1.80 cm LV SV:         43 LV SV Index:   26 LVOT Area:     2.54  cm  IVC IVC diam: 2.10 cm LEFT ATRIUM           Index        RIGHT ATRIUM           Index LA diam:      3.50 cm 2.13 cm/m   RA Area:     17.60 cm LA Vol (A4C): 68.9 ml 41.87 ml/m  RA Volume:   48.30 ml  29.35 ml/m  AORTIC VALVE                    PULMONIC VALVE AV Area (Vmax):    1.98 cm     PV Vmax:       0.88 m/s AV Area (Vmean):   1.93 cm     PV Peak grad:  3.1 mmHg AV Area (VTI):     2.19 cm AV Vmax:           154.00 cm/s AV Vmean:          99.333 cm/s AV VTI:            0.198 m AV Peak Grad:      9.5 mmHg AV Mean Grad:      5.0 mmHg LVOT Vmax:          120.00 cm/s LVOT Vmean:        75.300 cm/s LVOT VTI:          0.170 m LVOT/AV VTI ratio: 0.86  AORTA Ao Root diam: 2.30 cm Ao Asc diam:  3.70 cm MITRAL VALVE                TRICUSPID VALVE MV Area (PHT): 5.50 cm     TR Peak grad:   39.2 mmHg MV Decel Time: 138 msec     TR Vmax:        313.00 cm/s MV E velocity: 146.00 cm/s                             SHUNTS                             Systemic VTI:  0.17 m                             Systemic Diam: 1.80 cm Orpah Cobb MD Electronically signed by Orpah Cobb MD Signature Date/Time: 06/12/2023/8:58:34 AM    Final    IR PERCUTANEOUS ART THROMBECTOMY/INFUSION INTRACRANIAL INC DIAG ANGIO  Result Date: 06/12/2023 INDICATION: New onset aphasia, left gaze deviation and right-sided weakness. Occluded left middle cerebral artery M1 segment on CT angiogram of the head and neck. EXAM: 1. EMERGENT LARGE VESSEL OCCLUSION THROMBOLYSIS (anterior CIRCULATION) COMPARISON:  CT angiogram of the head and neck June 10, 2023. MEDICATIONS: Ancef 2 g IV antibiotic was administered within 1 hour of the procedure. ANESTHESIA/SEDATION: General anesthesia. CONTRAST:  Omnipaque 300 approximately 70 mL. FLUOROSCOPY TIME:  Fluoroscopy Time: 17 minutes 42 seconds (830 mGy). COMPLICATIONS: None immediate. TECHNIQUE: Following a full explanation of the procedure along with the potential associated complications, an informed witnessed consent was obtained. The risks of intracranial hemorrhage of 10%, worsening neurological deficit, ventilator dependency, death and inability to revascularize were all reviewed in detail with the patient's family. The patient was then put under general anesthesia by the Department of Anesthesiology at [facility]. The  right groin was prepped and draped in the usual sterile fashion. Thereafter using modified Seldinger technique, transfemoral access into the right common femoral artery was obtained without difficulty. Over an 0.035 inch guidewire an  8 French 25 cm Pinnacle sheath was inserted. Through this, and also over an 0.035 inch guidewire a combination of a 125 cm 6 [place] Berenstein support catheter inside of an 087 95 cm balloon guide catheter was advanced and positioned in the left common carotid artery and then distally in the left internal carotid artery. FINDINGS: The left common carotid arteriogram demonstrates the left external carotid artery, and its major branches to be widely patent. The left internal carotid artery at the bulb to the cranial skull base is widely patent with moderate tortuosity in its proximal 1/3. The petrous, the cavernous and the supraclinoid left ICA are widely patent. Left anterior cerebral artery opacifies into the capillary and venous phases. Left middle cerebral artery demonstrates occlusion in its mid 1/3. Opacification of the anterior temporal branch is noted. PROCEDURE: Through the balloon guide catheter in the mid cervical left ICA, a combination of an 021 162 Trevo ProVue microcatheter inside of an 071 132 cm Zoom aspiration catheter was advanced over an 018 inch micro guidewire with a moderate J configuration to the supraclinoid left ICA. The micro guidewire was then navigated without difficulty using a torque device through the occluded left M1 segment into the M2 region of the inferior division followed by the microcatheter. The micro guidewire was removed. Good aspiration was obtained from the hub of the microcatheter. A gentle control arteriogram through the microcatheter demonstrated safe positioning of the tip of the microcatheter which was then connected to continuous heparinized saline infusion. A 4 mm x 40 mm Solitaire X retrieval device was then advanced and positioned in the usual manner such that the proximal portion of the device was just proximal to the occluded segment. At this time, the Zoom aspiration catheter was advanced to engage the proximal portion of the occluded segment of the left M1.  Constant aspiration was then applied at the hub of the balloon guide catheter with a 20 mL syringe, and at the hub of the Zoom aspiration catheter with the Penumbra aspiration pump for a minute and a half. Thereafter, the combination of the retrieval device the aspiration catheter, and the microcatheter were removed. A control arteriogram performed through the balloon guide catheter in the distal left ICA demonstrated complete revascularization of the left middle cerebral artery distribution achieving a TICI 3 revascularization. The left anterior cerebral artery remained widely patent. Control arteriograms were then performed at 10 and 20 minutes post revascularization. This continued to demonstrate patency of the left middle cerebral artery unchanged. A small non flow-limiting dissection was evident in the mid left ICA without evidence of filling defects. A final control arteriogram performed through the balloon guide catheter in the left common carotid artery demonstrated wide patency of the left internal carotid artery proximally and distally. The left middle and the left anterior cerebral arteries continued to demonstrate wide patency with TICI 3 revascularization. The balloon guide was removed. A flat panel CT of the brain demonstrated no evidence of hemorrhagic complications. An 8 French closure device was deployed at the right groin puncture site for hemostasis. Distal pulses remained palpable in both feet unchanged. The patient was extubated. Upon recovery, the patient was able to follow simple commands. No facial asymmetry was apparent. Tongue was midline. Patient was able to raise her right arm and leg  against gravity. The patient was then transferred to the neuro ICU for post revascularization care. IMPRESSION: Status post complete revascularization of left middle cerebral M1 segment with 1 pass with a 4 mm x 40 mm Solitaire X retrieval device, and contact aspiration achieving a TICI 3 revascularization.  PLAN: As per referring MD. Electronically Signed   By: Julieanne Cotton M.D.   On: 06/12/2023 08:07   IR CT Head Ltd  Result Date: 06/12/2023 INDICATION: New onset aphasia, left gaze deviation and right-sided weakness. Occluded left middle cerebral artery M1 segment on CT angiogram of the head and neck. EXAM: 1. EMERGENT LARGE VESSEL OCCLUSION THROMBOLYSIS (anterior CIRCULATION) COMPARISON:  CT angiogram of the head and neck June 10, 2023. MEDICATIONS: Ancef 2 g IV antibiotic was administered within 1 hour of the procedure. ANESTHESIA/SEDATION: General anesthesia. CONTRAST:  Omnipaque 300 approximately 70 mL. FLUOROSCOPY TIME:  Fluoroscopy Time: 17 minutes 42 seconds (830 mGy). COMPLICATIONS: None immediate. TECHNIQUE: Following a full explanation of the procedure along with the potential associated complications, an informed witnessed consent was obtained. The risks of intracranial hemorrhage of 10%, worsening neurological deficit, ventilator dependency, death and inability to revascularize were all reviewed in detail with the patient's family. The patient was then put under general anesthesia by the Department of Anesthesiology at [facility]. The right groin was prepped and draped in the usual sterile fashion. Thereafter using modified Seldinger technique, transfemoral access into the right common femoral artery was obtained without difficulty. Over an 0.035 inch guidewire an 8 French 25 cm Pinnacle sheath was inserted. Through this, and also over an 0.035 inch guidewire a combination of a 125 cm 6 [place] Berenstein support catheter inside of an 087 95 cm balloon guide catheter was advanced and positioned in the left common carotid artery and then distally in the left internal carotid artery. FINDINGS: The left common carotid arteriogram demonstrates the left external carotid artery, and its major branches to be widely patent. The left internal carotid artery at the bulb to the cranial skull  base is widely patent with moderate tortuosity in its proximal 1/3. The petrous, the cavernous and the supraclinoid left ICA are widely patent. Left anterior cerebral artery opacifies into the capillary and venous phases. Left middle cerebral artery demonstrates occlusion in its mid 1/3. Opacification of the anterior temporal branch is noted. PROCEDURE: Through the balloon guide catheter in the mid cervical left ICA, a combination of an 021 162 Trevo ProVue microcatheter inside of an 071 132 cm Zoom aspiration catheter was advanced over an 018 inch micro guidewire with a moderate J configuration to the supraclinoid left ICA. The micro guidewire was then navigated without difficulty using a torque device through the occluded left M1 segment into the M2 region of the inferior division followed by the microcatheter. The micro guidewire was removed. Good aspiration was obtained from the hub of the microcatheter. A gentle control arteriogram through the microcatheter demonstrated safe positioning of the tip of the microcatheter which was then connected to continuous heparinized saline infusion. A 4 mm x 40 mm Solitaire X retrieval device was then advanced and positioned in the usual manner such that the proximal portion of the device was just proximal to the occluded segment. At this time, the Zoom aspiration catheter was advanced to engage the proximal portion of the occluded segment of the left M1. Constant aspiration was then applied at the hub of the balloon guide catheter with a 20 mL syringe, and at the hub of the Zoom  aspiration catheter with the Penumbra aspiration pump for a minute and a half. Thereafter, the combination of the retrieval device the aspiration catheter, and the microcatheter were removed. A control arteriogram performed through the balloon guide catheter in the distal left ICA demonstrated complete revascularization of the left middle cerebral artery distribution achieving a TICI 3  revascularization. The left anterior cerebral artery remained widely patent. Control arteriograms were then performed at 10 and 20 minutes post revascularization. This continued to demonstrate patency of the left middle cerebral artery unchanged. A small non flow-limiting dissection was evident in the mid left ICA without evidence of filling defects. A final control arteriogram performed through the balloon guide catheter in the left common carotid artery demonstrated wide patency of the left internal carotid artery proximally and distally. The left middle and the left anterior cerebral arteries continued to demonstrate wide patency with TICI 3 revascularization. The balloon guide was removed. A flat panel CT of the brain demonstrated no evidence of hemorrhagic complications. An 8 French closure device was deployed at the right groin puncture site for hemostasis. Distal pulses remained palpable in both feet unchanged. The patient was extubated. Upon recovery, the patient was able to follow simple commands. No facial asymmetry was apparent. Tongue was midline. Patient was able to raise her right arm and leg against gravity. The patient was then transferred to the neuro ICU for post revascularization care. IMPRESSION: Status post complete revascularization of left middle cerebral M1 segment with 1 pass with a 4 mm x 40 mm Solitaire X retrieval device, and contact aspiration achieving a TICI 3 revascularization. PLAN: As per referring MD. Electronically Signed   By: Julieanne Cotton M.D.   On: 06/12/2023 08:07   VAS [place] LOWER EXTREMITY VENOUS (DVT)  Result Date: 06/11/2023  Lower Venous DVT Study Patient Name:  OLIVEAH WOLFGRAM  Date of Exam:   06/11/2023 Medical Rec #: [id number]     Accession #:    [id number] Date of Birth: 28-Jul-1929     Patient Gender: F Patient Age:   81 years Exam Location:  [facility] Procedure:      VAS [place] LOWER EXTREMITY VENOUS (DVT) Referring Phys: Scheryl Marten Deveney Bayon  --------------------------------------------------------------------------------  Indications: Stroke.  Comparison Study: No previous exams Performing Technologist: Jody Hill RVT, RDMS  Examination Guidelines: A complete evaluation includes B-mode imaging, spectral Doppler, color Doppler, and power Doppler as needed of all accessible portions of each vessel. Bilateral testing is considered an integral part of a complete examination. Limited examinations for reoccurring indications may be performed as noted. The reflux portion of the exam is performed with the patient in reverse Trendelenburg.    *See table(s) above for measurements and observations.    Preliminary    MR BRAIN WO CONTRAST  Result Date: 06/11/2023 CLINICAL DATA:  Stroke-like symptoms, status post thrombectomy for left M1 occlusion EXAM: MRI HEAD WITHOUT CONTRAST TECHNIQUE: Multiplanar, multiecho pulse sequences of the brain and surrounding structures were obtained without intravenous contrast. COMPARISON:  06/10/2023 CT head, no prior MRI available FINDINGS: Brain: Restricted diffusion with ADC correlate in the left putamen, anterior greater than posterior, and left caudate head (series 2, images 24-33), consistent with acute infarcts. Additional small cortical infarct more superior left frontal lobe (series 2, image 39). No acute hemorrhage, mass, mass effect, or midline shift. No hydrocephalus or extra-axial collection. Pituitary and craniocervical junction within normal limits. T2 hyperintense signal in the periventricular white matter, likely the sequela of moderate to severe chronic small vessel ischemic  disease. Vascular: The left MCA flow void is present but appears diminutive compared to the right. Otherwise normal arterial flow voids. Skull and upper cervical spine: Normal marrow signal. Sinuses/Orbits: Clear paranasal sinuses. No acute finding in the orbits. Status post bilateral lens replacements. Other: The mastoid air cells are well  aerated. IMPRESSION: 1. Acute infarcts in the left putamen, anterior greater than posterior, and left caudate head. Additional small cortical infarct in the more superior left frontal lobe. 2. The left MCA flow void is present but appears diminutive compared to the right. Electronically Signed   By: Wiliam Ke M.D.   On: 06/11/2023 02:09   CT ANGIO HEAD NECK W WO CM W PERF (CODE STROKE)  Result Date: 06/10/2023 CLINICAL DATA:  89 year old female code stroke presentation with plain head CT evidence of left MCA ELVO. EXAM: CT ANGIOGRAPHY HEAD AND NECK CT PERFUSION BRAIN TECHNIQUE: Multidetector CT imaging of the head and neck was performed using the standard protocol during bolus administration of intravenous contrast. Multiplanar CT image reconstructions and MIPs were obtained to evaluate the vascular anatomy. Carotid stenosis measurements (when applicable) are obtained utilizing NASCET criteria, using the distal internal carotid diameter as the denominator. Multiphase CT imaging of the brain was performed following IV bolus contrast injection. Subsequent parametric perfusion maps were calculated using RAPID software. RADIATION DOSE REDUCTION: This exam was performed according to the departmental dose-optimization program which includes automated exposure control, adjustment of the mA and/or kV according to patient size and/or use of iterative reconstruction technique. CONTRAST:  OMNIPAQUE IOHEXOL 350 MG/ML SOLN COMPARISON:  Plain head CT 1212 hours today. FINDINGS: CT Brain Perfusion Findings: ASPECTS: 7 CBF (<30%) Volume: 7mL (up to 13 mL using the least stringent parameters). Perfusion (Tmax>6.0s) volume: 87mL. Hypoperfusion index of 0.4 (34 mL of T-max greater than 10 seconds) Mismatch Volume: 80mL Infarction Location:Left MCA CTA NECK Skeleton: Previous sternotomy. Age-appropriate cervical spine degeneration. No acute osseous abnormality identified. Upper chest: Prior sternotomy. Mild upper lung  atelectasis and gas trapping. Other neck: Partially retropharyngeal course of both carotids, otherwise negative for age. Aortic arch: 3 vessel arch. Mild arch tortuosity. Calcified aortic atherosclerosis. Right carotid system: Brachiocephalic artery is mildly tortuous without stenosis. Mildly tortuous right CCA with a retropharyngeal course. Bulky calcified plaque at the right ICA origin and bulb, up to 50 % stenosis with respect to the distal vessel. Tortuous right ICA distal to the bulb. Left carotid system: Tortuous left CCA with no plaque or stenosis. Retropharyngeal course distally. Mild to moderate calcified plaque at the left ICA origin and bulb with 50 % stenosis with respect to the distal vessel (series 11, image 84). Mildly tortuous left ICA distal to the bulb. Vertebral arteries: Calcified right subclavian artery origin and tortuosity without stenosis. Normal right vertebral artery origin. Tortuous right vertebral artery in the neck with no plaque or stenosis to the skull base. Proximal left subclavian artery mild plaque without stenosis. Normal left vertebral artery origin. Tortuous left vertebral artery in the neck with no plaque or stenosis to the skull base. CTA HEAD Posterior circulation: Mild bilateral V4 segment atherosclerosis with no significant stenosis. Patent vertebrobasilar junction. Normal right PICA origin. Left AICA appears to be dominant. Patent basilar artery without stenosis. Patent SCA and PCA origins. Small posterior communicating artery(s). Bilateral PCA branches are within normal limits. Anterior circulation: Both ICA siphons are patent. On the left there is mild to moderate calcified plaque primarily from the anterior genu distally. Only mild left siphon stenosis. On the  right there is more moderate calcified plaque, and there is moderate to severe right supraclinoid stenosis suspected on series 9, image 103 and series 13, image 64. Patent carotid termini. Patent MCA and ACA  origins. Normal anterior communicating artery. Bilateral ACA branches are within normal limits. Right MCA M1 segment and right MCA branches are within normal limits. Left MCA M1 is occluded distal to the temporal artery origins series 7, image 24. Very little early left MCA branch reconstitution. Venous sinuses: Early contrast timing, not evaluated. Anatomic variants: None significant. Review of the MIP images confirms the above findings IMPRESSION: 1. Positive for Left MCA M1 Occlusion. This was communicated to Dr. Derry Lory at 1217 hours via the [facility] messaging system. 2. CTP estimates a 7 mL infarct core concordant with ASPECTS. 87 mL estimated penumbra. Hypoperfusion index of 0.4. 3. Generalized arterial tortuosity in the head and neck. Bilateral carotid atherosclerosis with up to 50% stenosis on the Left. Where as distal Right ICA siphon stenosis appears moderate to severe. No significant posterior circulation stenosis. 4.  Aortic Atherosclerosis (ICD10-I70.0). Electronically Signed   By: Odessa Fleming M.D.   On: 06/10/2023 12:30   CT HEAD CODE STROKE WO CONTRAST  Result Date: 06/10/2023 CLINICAL DATA:  Code stroke.  89 year old female. EXAM: CT HEAD WITHOUT CONTRAST TECHNIQUE: Contiguous axial images were obtained from the base of the skull through the vertex without intravenous contrast. RADIATION DOSE REDUCTION: This exam was performed according to the departmental dose-optimization program which includes automated exposure control, adjustment of the mA and/or kV according to patient size and/or use of iterative reconstruction technique. COMPARISON:  Head CT 10/30/2013. FINDINGS: Brain: Patchy asymmetric bilateral white matter and deep gray matter hypodensity slightly greater in the left hemisphere. But furthermore, there is suspicious asymmetric loss of left basal ganglia gray-white differentiation on series 2, image 14. Posterior left insula also indistinct. Other left MCA territory gray-white appears  preserved. No midline shift, mass effect, or evidence of intracranial mass lesion. No ventriculomegaly. No acute intracranial hemorrhage identified. Vascular: Asymmetric hyperdensity of the left MCA distal M1 on sagittal image 31. Skull: No acute osseous abnormality identified. Sinuses/Orbits: Paranasal sinuses, middle ears and mastoids remain well aerated, mild. Mild retained secretions in the right ethmoid and maxillary sinuses. Other: Rightward gaze deviation.  Stable scalp soft tissues. ASPECTS [facility] Stroke Program Early CT Score) Total score (0-10 with 10 being normal): 7 IMPRESSION: 1. Hyperdense Left MCA and evidence of Left basal ganglia cytotoxic edema. ASPECTS 7. No acute hemorrhage identified. These results were communicated to Dr. Derry Lory at 12:16 pm on 06/10/2023 by text page via the [facility] messaging system. 2. Underlying bilateral small vessel disease. Electronically Signed   By: Odessa Fleming M.D.   On: 06/10/2023 12:17       HISTORY OF PRESENT ILLNESS 89 y.o. female with history of HTN, HLD, aortic stenosis, aortic valve repair, tachycardia, anxiety, previous concerns for Afib but no diagnosis presents after being found down with dysarthria and word salad NIH on Admission 17   HOSPITAL COURSE Stroke:  left MCA infarct with left MCA M1 occlusion s/p IR with TICI 3  Etiology:  cardioembolic in the setting of newly diagnosed A fib   Code Stroke  CT head Hyperdense Left MCA and evidence of Left basal ganglia cytotoxic edema. ASPECTS 7 CTA head & neck Positive for Left MCA M1 Occlusion  CT perfusion 7/87 Status post IR left M1 occlusion with TICI3 Post IR CT no hemorrhage  MRI  Acute infarcts in the  left putamen, anterior greater than posterior, and left caudate head Doppler LE no DVT 2D Echo: LVEF 55 to 60%, moderately dilated left atria, moderately dilated right atria, mild to moderate MVR, moderate to severe TVR, pericardial aortic valve present, grade 3 atheroma plaque involving aortic  root and ascending aorta.  LDL 50 HgbA1c 5.4 VTE prophylaxis - Lovenox  aspirin 81 mg daily prior to admission, will keep 81 mg Aspirin and add Eliquis 2.5mg  bid. Per Dr. Algie Coffer, will continue ASA for 21 days. Continue eliquis life long Therapy recommendations:  CIR Disposition:  pending   Atrial fibrillation, new diagnosis Home Meds: none  EKG 11/25 A fib with RVR On metoprolol Currently on Eliquis, low-dose life long.  Dr. Algie Coffer on board (patient's outpatient cardiologist)  Hx of hypertension hypotension Home meds: Amlodipine 5 mg, Lasix 20 mg, hydralazine 25 mg, lisinopril 5 mg, losartan 100 mg, metoprolol 25 mg Now on 25 mg metoprolol  Off pressor Long term BP goal normotensive   Hyperlipidemia Home meds: Lovastatin 40 mg  LDL 50, goal < 70 Now on pravastatin 40 No high intensity of statin due to LDL at goal Continue statin at discharge   Other Stroke Risk Factors Advanced age   DISCHARGE EXAM Blood pressure 132/87, pulse 88, temperature 98.4 F (36.9 C), temperature source Oral, resp. rate 17, height 5\' 1"  (1.549 m), weight 65.6 kg, SpO2 95%.  PHYSICAL EXAM General:  Alert, well-nourished, well-developed patient in no acute distress Psych:  Mood and affect appropriate for situation CV: irregularly irregular  Respiratory:  Regular, unlabored respirations on room air    NEURO:  Mental Status: Awake, alert, oriented. Knows the year and her age.  Speech: mild dysarthria with slight perseveration. She is able to repeat phrases and simple sentences.    Cranial Nerves:  II: PERRL. Right Hemianopia  III, IV, VI: EOMI.  V: Sensation is intact and symmetrical to light touch VII: Right mild facial droop VIII: hearing intact to voice. IX, X: Symmetric palate, normal phonation XB:[id number] shrug 5/5. WNU:UVOZDGU tongue protrusion Motor: 5/5 strength.  Right arm with slight pronator drift. Tone: is normal and bulk is normal Sensation-intact bilaterally to light  touch. Coordination: FTN intact bilaterally , no ataxia Gait- deferred  Discharge Diet      Diet   Diet heart healthy/carb modified Room service appropriate? Yes with Assist; Fluid consistency: Thin   liquids  DISCHARGE PLAN Disposition:  Transfer to [facility] for ongoing PT, OT and ST aspirin 81 mg daily and Eliquis 2.5mg  BID for 30 days, then only take Eliquis 2.5mg  BID  for secondary stroke prevention  Recommend ongoing stroke risk factor control by Primary Care Physician at time of discharge from inpatient rehabilitation. Follow-up PCP Orpah Cobb, MD in 2 weeks following discharge from rehab. Follow-up in [facility] in 8 weeks following discharge from rehab, office to schedule an appointment.   35 minutes were spent preparing discharge.  Pt seen by Neuro NP/APP and later by MD. Note/plan to be edited by MD as needed.    Lynnae January, DNP, AGACNP-BC [facility] Please use AMION for contact information & EPIC for messaging.  ATTENDING NOTE: I reviewed above note and agree with the assessment and plan. Pt was seen and examined.   Daughter, and significant other are at the bedside. Pt lying in bed, right facial droop much improved. Pending CIR placement. Discussed with Dr. Algie Coffer, will do ASA 21 days but continue eliquis 2.5 bid life long.  Continue low intensity statin. On metoprolol for rate control. Medically ready for CIR placement. Follow up at [facility] in 4-6 weeks.  For detailed assessment and plan, please refer to above/below as I have made changes wherever appropriate.   Marvel Plan, MD PhD Stroke Neurology 06/13/2023 2:39 PM

## 2023-06-13 NOTE — Consult Note (Signed)
Ref: Orpah Cobb, MD   Subjective:  Awake. Ambulating with some assistance. Right sided weakness continues.  Objective:  Vital Signs in the last 24 hours: Temp:  [98.2 F (36.8 C)-98.8 F (37.1 C)] 98.4 F (36.9 C) (11/27 1100) Pulse Rate:  [71-102] 88 (11/27 1100) Cardiac Rhythm: Atrial fibrillation (11/27 0737) Resp:  [16-18] 17 (11/27 0938) BP: (109-141)/(63-95) 132/87 (11/27 1100) SpO2:  [93 %-97 %] 95 % (11/27 1100)  Physical Exam: BP Readings from Last 1 Encounters:  06/13/23 132/87     Wt Readings from Last 1 Encounters:  06/12/23 65.6 kg    Weight change:  Body mass index is 27.33 kg/m. HEENT: [place]/AT, Eyes-Blue, Conjunctiva-Pink, Sclera-Non-icteric. Neck: No JVD, No bruit, Trachea midline. Lungs:  Clear, Bilateral. Cardiac:  Irregular rhythm, normal S1 and S2, no S3. III/VI systolic murmur. Abdomen:  Soft, non-tender. BS present. Extremities:  No edema present. No cyanosis. No clubbing. CNS: AxOx3, Cranial nerves grossly intact, moves all 4 extremities. Right sided weakness with thick speech. Skin: Warm and dry.   Intake/Output from previous day: No intake/output data recorded.    Lab Results: BMET    Component Value Date/Time   NA 138 06/13/2023 0519   NA 138 06/12/2023 0426   NA 135 06/11/2023 0538   K 3.9 06/13/2023 0519   K 3.8 06/12/2023 0426   K 3.7 06/11/2023 0538   CL 104 06/13/2023 0519   CL 105 06/12/2023 0426   CL 105 06/11/2023 0538   CO2 24 06/13/2023 0519   CO2 24 06/12/2023 0426   CO2 22 06/11/2023 0538   GLUCOSE 112 (H) 06/13/2023 0519   GLUCOSE 104 (H) 06/12/2023 0426   GLUCOSE 112 (H) 06/11/2023 0538   BUN 9 06/13/2023 0519   BUN 14 06/12/2023 0426   BUN 9 06/11/2023 0538   CREATININE 0.61 06/13/2023 0519   CREATININE 0.65 06/12/2023 0426   CREATININE 0.59 06/11/2023 0538   CALCIUM 9.0 06/13/2023 0519   CALCIUM 9.0 06/12/2023 0426   CALCIUM 8.5 (L) 06/11/2023 0538   GFRNONAA >60 06/13/2023 0519   GFRNONAA >60 06/12/2023  0426   GFRNONAA >60 06/11/2023 0538   GFRAA >60 06/08/2018 1632   GFRAA >60 05/27/2018 0925   GFRAA >90 03/07/2014 0410   CBC    Component Value Date/Time   WBC 6.4 06/13/2023 0519   RBC 4.40 06/13/2023 0519   HGB 12.9 06/13/2023 0519   HCT 39.9 06/13/2023 0519   PLT 156 06/13/2023 0519   MCV 90.7 06/13/2023 0519   MCH 29.3 06/13/2023 0519   MCHC 32.3 06/13/2023 0519   RDW 13.4 06/13/2023 0519   LYMPHSABS 1.0 06/11/2023 0538   MONOABS 0.9 06/11/2023 0538   EOSABS 0.0 06/11/2023 0538   BASOSABS 0.0 06/11/2023 0538   HEPATIC Function Panel Recent Labs    06/10/23 1219  PROT 7.5  ALBUMIN 4.1  AST 31  ALT 19  ALKPHOS 67   HEMOGLOBIN A1C Lab Results  Component Value Date   MPG 108.28 06/10/2023   CARDIAC ENZYMES No results found for: "CKTOTAL", "CKMB", "CKMBINDEX", "TROPONINI" BNP No results for input(s): "PROBNP" in the last 8760 hours. TSH No results for input(s): "TSH" in the last 8760 hours. CHOLESTEROL Recent Labs    06/11/23 0538  CHOL 117    Scheduled Meds:  apixaban  2.5 mg Oral BID   aspirin EC  81 mg Oral Daily   Chlorhexidine Gluconate Cloth  6 each Topical Daily   diltiazem  60 mg Oral QID   metoprolol  tartrate  25 mg Oral BID   pravastatin  40 mg Oral q1800   sodium chloride flush  3 mL Intravenous Once   Continuous Infusions: PRN Meds:.acetaminophen **OR** acetaminophen (TYLENOL) oral liquid 160 mg/5 mL **OR** acetaminophen, ALPRAZolam, mouth rinse, senna-docusate  Assessment/Plan: Acute left MCA stroke, embolic Atrial fibrillation, [id number] score of 6 HTN HLD Obesity S/P AV replacement, 21 mm Edwards Pericardial  Plan: Continue Eliquis, aspirin, metoprolol and Diltiazem. Consider one a day diltiazem like 240 mg. daily in 1-2 days.   LOS: 3 days   Time spent including chart review, lab review, examination, discussion with patient/30 : 30 min   Orpah Cobb  MD  06/13/2023, 1:43 PM

## 2023-06-13 NOTE — H&P (Signed)
Physical Medicine and Rehabilitation Admission H&P    No chief complaint on file. : HPI: Sara Donaldson is a 89 year old right-handed female with history significant for hypertension, obesity with BMI 27.33, tachycardia, anxiety, aortic stenosis with aortic valve repair 03/02/2014 maintained on aspirin daily.  Per chart review patient lives alone.  1 level home one-step to entry.  Independent driving prior to admission.  She does have excellent support in the area.  Presented 06/10/2023 after being found down by her daughter with right sided weakness as well as right gaze preference with dysarthria.  Admission chemistries unremarkable except WBC 14,000, glucose 153, hemoglobin A1c 5.4.  Cranial CT scan showed hyperdense left MCA and evidence of left basal ganglia cytotoxic edema.  No acute hemorrhage identified.  CTA head and neck positive for left MCA M1 occlusion.  Generalized arterial tortuosity in the head and neck.  Bilateral carotid atherosclerosis with up to 50% stenosis on the left.  No significant posterior circulation stenosis.  Underwent thrombectomy of left M1 occlusion per interventional radiology 11/24.  MRI follow-up 06/11/2023 shows acute infarct in the left putamen, anterior greater than posterior and left caudate head.  Additional small cortical infarct in the more superior left frontal lobe.  Echocardiogram with ejection fraction of 55 to 60% no wall motion abnormalities.  Hospital course cardiology services consulted 11/26 after telemetry monitor showed atrial fibrillation with moderate ventricular response.  She was placed on Cardizem as well as low-dose beta-blocker.  Patient currently maintained on low-dose aspirin and Eliquis 2.5 mg BID. Tolerating a regular consistency diet.  Plan for lifelong Eliquis treatment.  Patient was seen by cardiology, consider one a day diltiazem 240 mg in 1 to 2 days. Therapy evaluations completed due to patient decreased functional mobility right side  weakness was admitted for a comprehensive rehab program.  Review of Systems  Constitutional:  Negative for chills and fever.  HENT:  Negative for congestion and hearing loss.   Eyes:  Negative for blurred vision and double vision.  Respiratory:  Negative for cough, shortness of breath and wheezing.   Cardiovascular:  Positive for palpitations. Negative for chest pain and leg swelling.  Gastrointestinal:  Positive for constipation. Negative for heartburn, nausea and vomiting.  Genitourinary:  Negative for dysuria, flank pain and hematuria.  Musculoskeletal:  Positive for joint pain and myalgias.  Skin:  Negative for rash.  Neurological:  Positive for sensory change, speech change and weakness. Negative for headaches.  All other systems reviewed and are negative.  Past Medical History:  Diagnosis Date   Aortic stenosis    Bronchitis    Heart murmur    Hypercholesterolemia    Hypertension    Thrombocytopenia (HCC) 03/05/2014   Past Surgical History:  Procedure Laterality Date   AORTIC VALVE REPLACEMENT N/A 03/02/2014   Procedure: AORTIC VALVE REPLACEMENT (AVR);  Surgeon: Loreli Slot, MD;  Location: [facility] OR;  Service: Open Heart Surgery;  Laterality: N/A;   CARDIAC CATHETERIZATION     4/10 2015   EYE SURGERY     IR CT HEAD LTD  06/10/2023   IR PERCUTANEOUS ART THROMBECTOMY/INFUSION INTRACRANIAL INC DIAG ANGIO  06/10/2023   LEFT HEART CATHETERIZATION WITH CORONARY ANGIOGRAM N/A 10/24/2013   Procedure: LEFT HEART CATHETERIZATION WITH CORONARY ANGIOGRAM;  Surgeon: Ricki Rodriguez, MD;  Location: [facility] CATH LAB;  Service: Cardiovascular;  Laterality: N/A;   RADIOLOGY WITH ANESTHESIA N/A 06/10/2023   Procedure: IR WITH ANESTHESIA;  Surgeon: Radiologist, Medication, MD;  Location: [facility] OR;  Service: Radiology;  Laterality: N/A;   TEE WITHOUT CARDIOVERSION N/A 01/12/2014   Procedure: TRANSESOPHAGEAL ECHOCARDIOGRAM (TEE);  Surgeon: Ricki Rodriguez, MD;  Location: [facility];  Service:  Cardiovascular;  Laterality: N/A;   Family History  Problem Relation Age of Onset   Heart disease Mother    Heart disease Father    Hypertension Other    Social History:  reports that she has never smoked. She has never used smokeless tobacco. She reports that she does not drink alcohol and does not use drugs. Allergies:  Allergies  Allergen Reactions   Silicone Other (See Comments)    Adhesive tape causes blisters; use paper tape only   Tape Other (See Comments)    Adhesive tape causes blisters; use paper tape only   Trazodone And Nefazodone Hives   Medications Prior to Admission  Medication Sig Dispense Refill   acetaminophen (TYLENOL) 500 MG tablet Take 1,000 mg by mouth 2 (two) times daily as needed for moderate pain (pain score 4-6), fever or headache.     ALPRAZolam (XANAX) 0.5 MG tablet Take 0.25 mg by mouth at bedtime as needed.     apixaban (ELIQUIS) 2.5 MG TABS tablet Take 1 tablet (2.5 mg total) by mouth 2 (two) times daily.     [START ON 06/14/2023] aspirin EC 81 MG tablet Take 1 tablet (81 mg total) by mouth daily. Swallow whole.     Biotin w/ Vitamins C & E (HAIR SKIN & NAILS GUMMIES PO) Take 1 each by mouth daily.     diltiazem (CARDIZEM) 60 MG tablet Take 1 tablet (60 mg total) by mouth 4 (four) times daily.     metoprolol tartrate (LOPRESSOR) 50 MG tablet Take 50 mg by mouth 2 (two) times daily.     Multiple Vitamins-Minerals (WOMENS 50+ MULTI VITAMIN) TABS Take 1 tablet by mouth daily.     pravastatin (PRAVACHOL) 40 MG tablet Take 1 tablet (40 mg total) by mouth daily at 6 PM.     senna-docusate (SENOKOT-S) 8.6-50 MG tablet Take 1 tablet by mouth at bedtime as needed for mild constipation or moderate constipation.        Home: Home Living Family/patient expects to be discharged to:: Private residence Living Arrangements: Alone Available Help at Discharge:  (daughter and grand daughter can provide 24/7 supervision) Type of Home: House Home Access: Stairs to  enter Secretary/administrator of Steps: 1 Home Layout: One level Bathroom Shower/Tub: Health visitor: Standard Bathroom Accessibility: Yes Home Equipment: None Additional Comments: wears glasses all the time  Lives With: Alone   Functional History: Prior Function Prior Level of Function : Independent/Modified Independent, Driving ADLs Comments: Pt completed all BADL and IADL tasks without assistance.   Functional Status:  Mobility: Bed Mobility Overal bed mobility: Needs Assistance Bed Mobility: Supine to Sit Supine to sit: Contact guard, HOB elevated, Used rails, Min assist General bed mobility comments: CGA for 80%, min A for last 20% to EOB as pt sitting up on pillow Transfers Overall transfer level: Needs assistance Equipment used: 1 person hand held assist, Straight cane Transfers: Sit to/from Stand, Bed to chair/wheelchair/BSC Sit to Stand: Min assist Bed to/from chair/wheelchair/BSC transfer type:: Step pivot Step pivot transfers: Min assist General transfer comment: VC required for sequencing and direction due to R inattention and decreased balance. Ambulation/Gait Ambulation/Gait assistance: Min assist Gait Distance (Feet): 12 Feet (+ 65') Assistive device: 1 person hand held assist, Straight cane Gait Pattern/deviations: Step-through pattern, Decreased stride length, Trunk flexed General  Gait Details: flexed posture assist for balance and safety with lines, walked to bathroom with HHA, out in hall with [facility] on L, max dues to attend to L as pt hugging rail and with no anticapatory correction to avoid obstacles Gait velocity: decr   ADL: ADL Overall ADL's : Needs assistance/impaired Eating/Feeding: Set up, Sitting (VC to attend to Right side of tray. Pt did not notice drink and fruit until OT used visual cues) Grooming: Wash/dry hands, Contact guard assist, Standing, Cueing for sequencing Grooming Details (indicate cue type and reason): VC required to  locate soap and paper towels which were located on the right side of pt. Upper Body Bathing: Minimal assistance, Sitting, Cueing for sequencing Lower Body Bathing: Maximal assistance, Sit to/from stand, Sitting/lateral leans Upper Body Dressing : Minimal assistance, Sitting, Cueing for sequencing Lower Body Dressing: Minimal assistance, Bed level, Sitting/lateral leans Lower Body Dressing Details (indicate cue type and reason): Pt donned one sock at bed level, HOB elevated with increased difficulty and excertion. Other sock donned while seated EOB with slightly less difficulty. Toilet Transfer: Minimal assistance, Ambulation, Regular Toilet, Grab bars, Cueing for sequencing Toilet Transfer Details (indicate cue type and reason): Regular toilet height too tall with pt only touching floor with tip toes. Toileting- Clothing Manipulation and Hygiene: Minimal assistance, Cueing for sequencing, Sitting/lateral lean, Sit to/from stand   Cognition: Cognition Overall Cognitive Status: Impaired/Different from baseline Arousal/Alertness: Awake/alert Orientation Level: Oriented to person, Disoriented to place, Disoriented to time, Disoriented to situation Attention: Selective Selective Attention: Appears intact Memory: Appears intact Cognition Arousal: Alert Behavior During Therapy: WFL for tasks assessed/performed Overall Cognitive Status: Impaired/Different from baseline Area of Impairment: Attention, Memory, Following commands, Safety/judgement, Awareness, Problem solving Current Attention Level: Focused Memory: Decreased short-term memory Following Commands: Follows one step commands inconsistently, Follows one step commands with increased time Safety/Judgement: Decreased awareness of safety, Decreased awareness of deficits Awareness: Intellectual Problem Solving: Slow processing, Decreased initiation, Difficulty sequencing, Requires verbal cues General Comments: easily distracted, R  innattention      Physical Exam: Blood pressure (!) 151/75, pulse 94, temperature 98.5 F (36.9 C), temperature source Oral, resp. rate 18, height 5\' 1"  (1.549 m), weight 64.1 kg, SpO2 95%.   General: No apparent distress HEENT: Head is normocephalic, atraumatic, sclera anicteric, oral mucosa pink and moist, dentition intact Neck: Supple without JVD or lymphadenopathy Heart: Irregularly irregular, systolic murmur Chest: CTA bilaterally without wheezes, rales, or rhonchi; no distress Abdomen: Soft, non-tender, non-distended, bowel sounds positive. Extremities: No clubbing, cyanosis, or edema. Pulses are 2+ Psych: Pt's affect is appropriate. Pt is cooperative Skin: Bruise dorsal R hand, IV L UE Neuro:    Mental Status: AAOx3, remembers 2/3 words 5 min later Speech/Languate: Naming and repetition intact, Dysarthria, follows simple commands CRANIAL NERVES: II: R upper quadrantanopia III, IV, VI: EOM intact V: Mild R facial weakness VII: no asymmetry VIII: normal hearing to speech IX, X: normal palatal elevation XI: 5/5 head turn and 5/5 shoulder shrug bilaterally XII: Tongue midline   MOTOR: RUE: 4-/5 Deltoid, 4/5 Biceps, 4/5 Triceps,4/5 Grip LUE: 5/5 Deltoid, 5/5 Biceps, 5/5 Triceps, 5/5 Grip RLE: HF 4-/5, KE 4+/5, ADF 4/5, APF 4/5 LLE: HF 5/5, KE 5/5, ADF 5/5, APF 5/5   REFLEXES:  No ankle clonus  SENSORY: Normal to touch all 4 extremities  Coordination: Normal finger to nose    Results for orders placed or performed during the hospital encounter of 06/10/23 (from the past 48 hour(s))  Basic metabolic panel  Status: Abnormal   Collection Time: 06/12/23  4:26 AM  Result Value Ref Range   Sodium 138 135 - 145 mmol/L   Potassium 3.8 3.5 - 5.1 mmol/L   Chloride 105 98 - 111 mmol/L   CO2 24 22 - 32 mmol/L   Glucose, Bld 104 (H) 70 - 99 mg/dL    Comment: Glucose reference range applies only to samples taken after fasting for at least 8 hours.   BUN 14 8 - 23 mg/dL    Creatinine, Ser 1.61 0.44 - 1.00 mg/dL   Calcium 9.0 8.9 - 09.6 mg/dL   GFR, Estimated >04 >54 mL/min    Comment: (NOTE) Calculated using the CKD-EPI Creatinine Equation (2021)    Anion gap 9 5 - 15    Comment: Performed at [facility], [address], [place], [place] [address]  CBC     Status: Abnormal   Collection Time: 06/12/23  4:26 AM  Result Value Ref Range   WBC 8.0 4.0 - 10.5 K/uL   RBC 4.46 3.87 - 5.11 MIL/uL   Hemoglobin 13.0 12.0 - 15.0 g/dL   HCT 91.4 78.2 - 95.6 %   MCV 91.3 80.0 - 100.0 fL   MCH 29.1 26.0 - 34.0 pg   MCHC 31.9 30.0 - 36.0 g/dL   RDW 21.3 08.6 - 57.8 %   Platelets 130 (L) 150 - 400 K/uL    Comment: REPEATED TO VERIFY   nRBC 0.0 0.0 - 0.2 %    Comment: Performed at [facility], [address] [place], [place] [address]  Basic metabolic panel     Status: Abnormal   Collection Time: 06/13/23  5:19 AM  Result Value Ref Range   Sodium 138 135 - 145 mmol/L   Potassium 3.9 3.5 - 5.1 mmol/L   Chloride 104 98 - 111 mmol/L   CO2 24 22 - 32 mmol/L   Glucose, Bld 112 (H) 70 - 99 mg/dL    Comment: Glucose reference range applies only to samples taken after fasting for at least 8 hours.   BUN 9 8 - 23 mg/dL   Creatinine, Ser 9.52 0.44 - 1.00 mg/dL   Calcium 9.0 8.9 - 84.1 mg/dL   GFR, Estimated >32 >44 mL/min    Comment: (NOTE) Calculated using the CKD-EPI Creatinine Equation (2021)    Anion gap 10 5 - 15    Comment: Performed at [facility], [address], [place], [place] [address]  CBC     Status: None   Collection Time: 06/13/23  5:19 AM  Result Value Ref Range   WBC 6.4 4.0 - 10.5 K/uL   RBC 4.40 3.87 - 5.11 MIL/uL   Hemoglobin 12.9 12.0 - 15.0 g/dL   HCT 25.3 66.4 - 40.3 %   MCV 90.7 80.0 - 100.0 fL   MCH 29.3 26.0 - 34.0 pg   MCHC 32.3 30.0 - 36.0 g/dL   RDW 47.4 25.9 - 56.3 %   Platelets 156 150 - 400 K/uL   nRBC 0.0 0.0 - 0.2 %    Comment: Performed at [facility], [address] [place], [place]  [address]   ECHOCARDIOGRAM COMPLETE  Result Date: 06/12/2023    ECHOCARDIOGRAM REPORT   Patient Name:   NAMIYA GARGAS Date of Exam: 06/12/2023 Medical Rec #:  [id number]    Height:       61.0 in Accession #:    [id number]   Weight:  144.6 lb Date of Birth:  09-Dec-1929    BSA:          1.646 m Patient Age:    89 years     BP:           105/60 mmHg Patient Gender: F            HR:           115 bpm. Exam Location:  Inpatient Procedure: 2D Echo Indications:     I48.0 Paroxysmal atrial fibrillation  History:         Patient has prior history of Echocardiogram examinations. Prior                  Cardiac Surgery, Stroke, Aortic Valve Disease and Mitral Valve                  Disease, Arrythmias:Atrial Fibrillation,                  Signs/Symptoms:Hypertensive Heart Disease; Risk                  Factors:Hypertension.                  Aortic Valve: 21 mm Edwards pericardial valve is present in the                  aortic position.  Sonographer:     Dondra Prader RVT Referring Phys:  Terrilee Files [facility] Diagnosing Phys: Orpah Cobb MD IMPRESSIONS  1. Left ventricular ejection fraction, by estimation, is 55 to 60%. The left ventricle has normal function. The left ventricle has no regional wall motion abnormalities. Left ventricular diastolic parameters are indeterminate.  2. Right ventricular systolic function is low normal. The right ventricular size is normal.  3. Left atrial size was moderately dilated.  4. Right atrial size was moderately dilated.  5. The mitral valve is degenerative. Mild to moderate mitral valve regurgitation. Moderate mitral annular calcification.  6. Tricuspid valve regurgitation is moderate to severe.  7. The aortic valve has been repaired/replaced. There is mild calcification of the aortic valve. There is mild thickening of the aortic valve. Aortic valve regurgitation is not visualized. Aortic valve sclerosis is present, with no evidence of aortic valve stenosis. There is a 21 mm Edwards  pericardial valve present in the aortic position.  8. There is Moderate (Grade III) atheroma plaque involving the aortic root and ascending aorta.  9. The inferior vena cava is dilated in size with <50% respiratory variability, suggesting right atrial pressure of 15 mmHg. FINDINGS  Left Ventricle: Left ventricular ejection fraction, by estimation, is 55 to 60%. The left ventricle has normal function. The left ventricle has no regional wall motion abnormalities. The left ventricular internal cavity size was normal in size. There is  borderline concentric left ventricular hypertrophy. Left ventricular diastolic parameters are indeterminate. Right Ventricle: The right ventricular size is normal. No increase in right ventricular wall thickness. Right ventricular systolic function is low normal. Left Atrium: Left atrial size was moderately dilated. Right Atrium: Right atrial size was moderately dilated. Pericardium: There is no evidence of pericardial effusion. Mitral Valve: The mitral valve is degenerative in appearance. Moderate mitral annular calcification. Mild to moderate mitral valve regurgitation. Tricuspid Valve: The tricuspid valve is normal in structure. Tricuspid valve regurgitation is moderate to severe. Aortic Valve: The aortic valve has been repaired/replaced. There is mild calcification of the aortic valve. There is mild thickening of the aortic valve.  There is moderate to severe aortic valve annular calcification. Aortic valve regurgitation is not visualized. Aortic valve sclerosis is present, with no evidence of aortic valve stenosis. Aortic valve mean gradient measures 5.0 mmHg. Aortic valve peak gradient measures 9.5 mmHg. Aortic valve area, by VTI measures 2.19 cm. There is a 21 mm Edwards pericardial valve present in the aortic position. Pulmonic Valve: The pulmonic valve was normal in structure. Pulmonic valve regurgitation is not visualized. Aorta: The aortic root is normal in size and structure.  There is moderate (Grade III) atheroma plaque involving the aortic root and ascending aorta. Venous: The inferior vena cava is dilated in size with less than 50% respiratory variability, suggesting right atrial pressure of 15 mmHg. IAS/Shunts: The atrial septum is grossly normal.  LEFT VENTRICLE PLAX 2D LVIDd:         3.80 cm LVIDs:         2.60 cm LV PW:         0.90 cm LV IVS:        1.10 cm LVOT diam:     1.80 cm LV SV:         43 LV SV Index:   26 LVOT Area:     2.54 cm  IVC IVC diam: 2.10 cm LEFT ATRIUM           Index        RIGHT ATRIUM           Index LA diam:      3.50 cm 2.13 cm/m   RA Area:     17.60 cm LA Vol (A4C): 68.9 ml 41.87 ml/m  RA Volume:   48.30 ml  29.35 ml/m  AORTIC VALVE                    PULMONIC VALVE AV Area (Vmax):    1.98 cm     PV Vmax:       0.88 m/s AV Area (Vmean):   1.93 cm     PV Peak grad:  3.1 mmHg AV Area (VTI):     2.19 cm AV Vmax:           154.00 cm/s AV Vmean:          99.333 cm/s AV VTI:            0.198 m AV Peak Grad:      9.5 mmHg AV Mean Grad:      5.0 mmHg LVOT Vmax:         120.00 cm/s LVOT Vmean:        75.300 cm/s LVOT VTI:          0.170 m LVOT/AV VTI ratio: 0.86  AORTA Ao Root diam: 2.30 cm Ao Asc diam:  3.70 cm MITRAL VALVE                TRICUSPID VALVE MV Area (PHT): 5.50 cm     TR Peak grad:   39.2 mmHg MV Decel Time: 138 msec     TR Vmax:        313.00 cm/s MV E velocity: 146.00 cm/s                             SHUNTS                             Systemic VTI:  0.17 m  Systemic Diam: 1.80 cm Orpah Cobb MD Electronically signed by Orpah Cobb MD Signature Date/Time: 06/12/2023/8:58:34 AM    Final       Blood pressure (!) 151/75, pulse 94, temperature 98.5 F (36.9 C), temperature source Oral, resp. rate 18, height 5\' 1"  (1.549 m), weight 64.1 kg, SpO2 95%.  Medical Problem List and Plan: 1. Functional deficits secondary to left MCA infarct with left MCA M1 occlusion status post thrombectomy  -patient may   shower  -ELOS/Goals: 10-14 days, Mod I to sup with PT/OT/SLP  -Admit to CIR  2.  Antithrombotics: -DVT/anticoagulation:  Pharmaceutical: Eliquis 2.5 mg twice daily, Eliquis lifelong  -antiplatelet therapy: Aspirin 81 mg daily 3. Pain Management: Tylenol as needed 4. Mood/Behavior/Sleep: Xanax 0.25 mg nightly as needed  -antipsychotic agents: N/A 5. Neuropsych/cognition: This patient is capable of making decisions on her own behalf. 6. Skin/Wound Care: Routine skin checks 7. Fluids/Electrolytes/Nutrition: Routine in and outs with follow-up chemistries 8.  Atrial fibrillation as well as history of aortic valve repair.  Follow-up cardiology services.  Cardizem 60 mg 4 times daily, Lopressor 25 mg twice daily.  Cardiac rate controlled.  Continue Eliquis as directed  -Consider clarifying with cardiology plan for once a day diltiazem? 9.  Hyperlipidemia.  Pravachol.  No high intensity statin due to LDL at goal 10.  Hypertension.  Long-term goal normotensive.  Home medications amlodipine 5 mg, Lasix 20 mg, hydralazine 25 mg, lisinopril 5 mg, losartan 100 mg  -Continue 25 mg metoprolol twice daily   [name], MD 06/13/2023  I have personally performed a face to face diagnostic evaluation of this patient and formulated the key components of the plan.  Additionally, I have personally reviewed laboratory data, imaging studies, as well as relevant notes and concur with the physician assistant's documentation above.  The patient's status has not changed from the original H&P.  Any changes in documentation from the acute care chart have been noted above.  [name], MD, [place] [facility]

## 2023-06-13 NOTE — Progress Notes (Signed)
[name], MD  Physician Physical Medicine and Rehabilitation   PMR Pre-admission     Signed   Date of Service: 06/12/2023  2:17 PM  Related encounter: ED to Hosp-Admission (Discharged) from 06/10/2023 in [facility]   Signed     Expand All Collapse All  Show:Clear all [x] Written[x] Templated[x] Copied  Added by: [x] Beckie Salts Tye [place], RN[x] [name], MD  [] Hover for details PMR Admission Coordinator Pre-Admission Assessment   Patient: Sara Donaldson is an 89 y.o., female MRN: [id number] DOB: 05-04-1930 Height: 5\' 1"  (154.9 cm) Weight: 65.6 kg   Insurance Information HMO:     PPO:      PCP:      IPA:      80/20:      OTHER:  PRIMARY: Medicare a and b      Policy#: [id number]      Subscriber: pt Benefits:  Phone #: passport one source online     Name: 11/26 Eff. Date: 11/14/1996     Deduct: $1632      Out of Pocket Max: none      Life Max: none CIR: 100%      SNF: 20 full days Outpatient: 80%     Co-Pay: 20% Home Health: 100%      Co-Pay: none DME: 80%     Co-Pay: 20% Providers: in network  SECONDARY: BCBS supplement      Policy#: [id number]        Financial Counselor:       Phone#:    The "Data Collection Information Summary" for patients in Inpatient Rehabilitation Facilities with attached "Privacy Act Statement-Health Care Records" was provided and verbally reviewed with: Patient and Family   Emergency Contact Information Contact Information       Name Relation Home Work Mobile    [place] Daughter     [phone]    Sara Donaldson [phone]             Other Contacts   None on File        Current Medical History  Patient Admitting Diagnosis: CVA   History of Present Illness: 89 year old right-handed female with history significant for hypertension, obesity with BMI 27.33, tachycardia, anxiety, aortic stenosis with aortic valve repair 03/02/2014 maintained on aspirin daily.  Presented 06/10/2023  after being found down by her daughter with right sided weakness as well as right gaze preference with dysarthria.    Admission chemistries unremarkable except WBC 14,000, glucose 153, hemoglobin A1c 5.4.  Cranial CT scan showed hyperdense left MCA and evidence of left basal ganglia cytotoxic edema.  No acute hemorrhage identified.  CTA head and neck positive for left MCA M1 occlusion.  Generalized arterial tortuosity in the head and neck.  Bilateral carotid atherosclerosis with up to 50% stenosis on the left.  No significant posterior circulation stenosis.  Underwent thrombectomy of left M1 occlusion per interventional radiology 11/24.  MRI follow-up 06/11/2023 shows acute infarct in the left putamen, anterior greater than posterior and left caudate head.  Additional small cortical infarct in the more superior left frontal lobe.  Echocardiogram with ejection fraction of 55 to 60% no wall motion abnormalities.  Hospital course cardiology services consulted 11/26 after telemetry monitor showed atrial fibrillation with moderate ventricular response.  She was placed on Cardizem as well as low-dose beta-blocker.  Patient currently maintained on low-dose aspirin and Eliquis awaiting plan by cardiology services.  Tolerating a regular consistency diet.    Complete NIHSS TOTAL: 1   Patient's  medical record from [facility] has been reviewed by the rehabilitation admission coordinator and physician.   Past Medical History      Past Medical History:  Diagnosis Date   Aortic stenosis     Bronchitis     Heart murmur     Hypercholesterolemia     Hypertension     Thrombocytopenia (HCC) 03/05/2014        Has the patient had major surgery during 100 days prior to admission? Yes   Family History   family history includes Heart disease in her father and mother; Hypertension in an other family member.   Current Medications  Current Medications    Current Facility-Administered Medications:     acetaminophen (TYLENOL) tablet 650 mg, 650 mg, Oral, Q4H PRN **OR** acetaminophen (TYLENOL) 160 MG/5ML solution 650 mg, 650 mg, Per Tube, Q4H PRN **OR** acetaminophen (TYLENOL) suppository 650 mg, 650 mg, Rectal, Q4H PRN, Deveshwar, Sanjeev, MD   ALPRAZolam Prudy Feeler) tablet 0.25 mg, 0.25 mg, Oral, QHS PRN, Orpah Cobb, MD   apixaban (ELIQUIS) tablet 2.5 mg, 2.5 mg, Oral, BID, Rollene Fare, RPH, 2.5 mg at 06/13/23 2130   aspirin EC tablet 81 mg, 81 mg, Oral, Daily, Hetty Blend C, NP, 81 mg at 06/13/23 8657   Chlorhexidine Gluconate Cloth 2 % PADS 6 each, 6 each, Topical, Daily, Erick Blinks, MD, 6 each at 06/13/23 0823   diltiazem (CARDIZEM) tablet 60 mg, 60 mg, Oral, QID, Orpah Cobb, MD, 60 mg at 06/13/23 8469   metoprolol tartrate (LOPRESSOR) tablet 25 mg, 25 mg, Oral, BID, Orpah Cobb, MD, 25 mg at 06/13/23 6295   Oral care mouth rinse, 15 mL, Mouth Rinse, PRN, Erick Blinks, MD   pravastatin (PRAVACHOL) tablet 40 mg, 40 mg, Oral, q1800, Marvel Plan, MD, 40 mg at 06/12/23 1705   senna-docusate (Senokot-S) tablet 1 tablet, 1 tablet, Oral, QHS PRN, Erick Blinks, MD   sodium chloride flush (NS) 0.9 % injection 3 mL, 3 mL, Intravenous, Once, Linwood Dibbles, MD     Patients Current Diet:  Diet Order                  Diet heart healthy/carb modified Room service appropriate? Yes with Assist; Fluid consistency: Thin  Diet effective now                       Precautions / Restrictions Precautions Precautions: Fall Precaution Comments: R inattention Restrictions Weight Bearing Restrictions: No    Has the patient had 2 or more falls or a fall with injury in the past year? No   Prior Activity Level Community (5-7x/wk): indepenednt, living alone and driving   Prior Functional Level Self Care: Did the patient need help bathing, dressing, using the toilet or eating? Independent   Indoor Mobility: Did the patient need assistance with walking from room to room (with  or without device)? Independent   Stairs: Did the patient need assistance with internal or external stairs (with or without device)? Independent   Functional Cognition: Did the patient need help planning regular tasks such as shopping or remembering to take medications? Independent   Patient Information Are you of Hispanic, Latino/a,or Spanish origin?: A. No, not of Hispanic, Latino/a, or Spanish origin What is your race?: A. White Do you need or want an interpreter to communicate with a doctor or health care staff?: 0. No   Patient's Response To:  Health Literacy and Transportation Is the patient able to respond to health literacy  and transportation needs?: Yes Health Literacy - How often do you need to have someone help you when you read instructions, pamphlets, or other written material from your doctor or pharmacy?: Never In the past 12 months, has lack of transportation kept you from medical appointments or from getting medications?: No In the past 12 months, has lack of transportation kept you from meetings, work, or from getting things needed for daily living?: No   Home Assistive Devices / Equipment Home Equipment: None   Prior Device Use: Indicate devices/aids used by the patient prior to current illness, exacerbation or injury?  cane   Current Functional Level Cognition   Arousal/Alertness: Awake/alert Overall Cognitive Status: Impaired/Different from baseline Current Attention Level: Focused Orientation Level: Oriented to person, Oriented to place, Oriented to situation Following Commands: Follows one step commands inconsistently, Follows one step commands with increased time Safety/Judgement: Decreased awareness of safety, Decreased awareness of deficits General Comments: easily distracted, R innattention Attention: Selective Selective Attention: Appears intact Memory: Appears intact    Extremity Assessment (includes Sensation/Coordination)   Upper Extremity  Assessment: Defer to OT evaluation RUE Deficits / Details: A/ROM [facility]. overall strength: 4/5. RUE Sensation:  (NT) RUE Coordination: decreased gross motor, decreased fine motor  Lower Extremity Assessment: Generalized weakness     ADLs   Overall ADL's : Needs assistance/impaired Eating/Feeding: Set up, Sitting (VC to attend to Right side of tray. Pt did not notice drink and fruit until OT used visual cues) Grooming: Wash/dry hands, Contact guard assist, Standing, Cueing for sequencing Grooming Details (indicate cue type and reason): VC required to locate soap and paper towels which were located on the right side of pt. Upper Body Bathing: Minimal assistance, Sitting, Cueing for sequencing Lower Body Bathing: Maximal assistance, Sit to/from stand, Sitting/lateral leans Upper Body Dressing : Minimal assistance, Sitting, Cueing for sequencing Lower Body Dressing: Minimal assistance, Bed level, Sitting/lateral leans Lower Body Dressing Details (indicate cue type and reason): Pt donned one sock at bed level, HOB elevated with increased difficulty and excertion. Other sock donned while seated EOB with slightly less difficulty. Toilet Transfer: Minimal assistance, Ambulation, Regular Toilet, Grab bars, Cueing for sequencing Toilet Transfer Details (indicate cue type and reason): Regular toilet height too tall with pt only touching floor with tip toes. Toileting- Clothing Manipulation and Hygiene: Minimal assistance, Cueing for sequencing, Sitting/lateral lean, Sit to/from stand     Mobility   Overal bed mobility: Needs Assistance Bed Mobility: Sit to Supine Supine to sit: Contact guard, HOB elevated, Used rails, Min assist Sit to supine: Min assist, Contact guard assist General bed mobility comments: CGA to return to supine, min A to position in bed     Transfers   Overall transfer level: Needs assistance Equipment used: 1 person hand held assist, Straight cane Transfers: Sit to/from Stand, Bed  to chair/wheelchair/BSC Sit to Stand: Min assist Bed to/from chair/wheelchair/BSC transfer type:: Step pivot Step pivot transfers: Min assist General transfer comment: light min A to steady on rise     Ambulation / Gait / Stairs / Psychologist, prison and probation services   Ambulation/Gait Ambulation/Gait assistance: Editor, commissioning (Feet): 125 Feet Assistive device: Straight cane Gait Pattern/deviations: Step-through pattern, Decreased stride length, Trunk flexed General Gait Details: improved attention to R with cues at start, with increased distance and dual tasking pt bumping door frame on return to room Gait velocity: decr     Posture / Balance Dynamic Sitting Balance Sitting balance - Comments: sitting EOB and on toilet in bathroom Balance  Overall balance assessment: Needs assistance Sitting-balance support: Feet supported Sitting balance-Leahy Scale: Fair Sitting balance - Comments: sitting EOB and on toilet in bathroom Standing balance support: Single extremity supported, During functional activity Standing balance-Leahy Scale: Poor Standing balance comment: Required at least 1 hand held assist to maintain balance. Pt also reaching for footboard of bed when walking by for additional support.     Special needs/care consideration      Previous Home Environment  Living Arrangements: Alone  Lives With: Alone Available Help at Discharge:  (daughter and grand daughter can provide 24/7 supervision) Type of Home: House Home Layout: One level Home Access: Stairs to enter Secretary/administrator of Steps: 1 Bathroom Shower/Tub: Health visitor: Standard Bathroom Accessibility: Yes How Accessible: Accessible via walker Home Care Services: No Additional Comments: wears glasses all the time   Discharge Living Setting Plans for Discharge Living Setting: Patient's home, Alone, House Type of Home at Discharge: House Discharge Home Layout: One level Discharge Home Access:  Stairs to enter Entrance Stairs-Rails: None Entrance Stairs-Number of Steps: 1 Discharge Bathroom Shower/Tub: Garment/textile technologist: Standard Discharge Bathroom Accessibility: Yes How Accessible: Accessible via walker Does the patient have any problems obtaining your medications?: No   Social/Family/Support Systems Contact Information: dtr, Kendal Hymen and grand dtr, Oceanographer Anticipated Caregiver: dtr and grand daughter Anticipated Caregiver's Contact Information: see contacts Ability/Limitations of Caregiver: no limitations Caregiver Availability: 24/7 Discharge Plan Discussed with Primary Caregiver: Yes Is Caregiver In Agreement with Plan?: Yes Does Caregiver/Family have Issues with Lodging/Transportation while Pt is in Rehab?: No   Goals Patient/Family Goal for Rehab: Mod I to superivsion with PT, OT and SLP Expected length of stay: ELOS 10 to 14 days Pt/Family Agrees to Admission and willing to participate: Yes Program Orientation Provided & Reviewed with Pt/Caregiver Including Roles  & Responsibilities: Yes   Decrease burden of Care through IP rehab admission: n/a   Possible need for SNF placement upon discharge: not anticipated   Patient Condition: I have reviewed medical records from [facility], spoken with  patient and family member. I met with patient at the bedside for inpatient rehabilitation assessment.  Patient will benefit from ongoing PT, OT, and SLP, can actively participate in 3 hours of therapy a day 5 days of the week, and can make measurable gains during the admission.  Patient will also benefit from the coordinated team approach during an Inpatient Acute Rehabilitation admission.  The patient will receive intensive therapy as well as Rehabilitation physician, nursing, social worker, and care management interventions.  Due to bladder management, bowel management, safety, skin/wound care, disease management, medication administration, pain  management, and patient education the patient requires 24 hour a day rehabilitation nursing.  The patient is currently min assist overall with mobility and basic ADLs.  Discharge setting and therapy post discharge at home with home health is anticipated.  Patient has agreed to participate in the Acute Inpatient Rehabilitation Program and will admit today.   Preadmission Screen Completed By:  Clois Dupes RN MSN 06/13/2023 11:50 AM ______________________________________________________________________   Discussed status with Dr. Natale Lay on 06/13/23 at 1150 and received approval for admission today.   Admission Coordinator:  Clois Dupes, RN MSN time 1150 Date 06/13/23    Assessment/Plan: Diagnosis: left MCA infarct with left MCA M1 occlusion status post thrombectomy  Does the need for close, 24 hr/day Medical supervision in concert with the patient's rehab needs make it unreasonable for this patient to be served in a  less intensive setting? Yes Co-Morbidities requiring supervision/potential complications: A-fib, hypertension, hyperlipidemia,\\ Due to bladder management, bowel management, safety, skin/wound care, disease management, medication administration, pain management, and patient education, does the patient require 24 hr/day rehab nursing? Yes Does the patient require coordinated care of a physician, rehab nurse, PT, OT, and SLP to address physical and functional deficits in the context of the above medical diagnosis(es)? Yes Addressing deficits in the following areas: balance, endurance, locomotion, strength, transferring, bowel/bladder control, bathing, dressing, feeding, grooming, toileting, cognition, speech, language, swallowing, and psychosocial support Can the patient actively participate in an intensive therapy program of at least 3 hrs of therapy 5 days a week? Yes The potential for patient to make measurable gains while on inpatient rehab is  excellent Anticipated functional outcomes upon discharge from inpatient rehab: modified independent and supervision PT, modified independent and supervision OT, modified independent and supervision SLP Estimated rehab length of stay to reach the above functional goals is: 10-14 Anticipated discharge destination: Home 10. Overall Rehab/Functional Prognosis: excellent     MD Signature: [name]          Revision History

## 2023-06-13 NOTE — Progress Notes (Signed)
Speech Language Pathology Treatment: Cognitive-Linquistic  Patient Details Name: Sara Donaldson MRN: [id number] DOB: 1930-02-26 Today's Date: 06/13/2023 Time: 1000-1014 SLP Time Calculation (min) (ACUTE ONLY): 14 min  Assessment / Plan / Recommendation Clinical Impression  Sara Donaldson presented with improved word-finding during conversational speech. Provided with strategies to improve intelligibility of speech, which she implemented with written cues after modeling.  Demonstrates good carry-over after initial review. Able to name category items with improved spontaneity (max of 6).  Making progress. SLP will follow while admitted.  HPI HPI: 89 year old female presented to ED with aphasia. Dx left MCA CVA, complete revascularization 06/10/23. PMHx CAD, AV replacement in 02/2014, HLD and obesity.   Independent with driving, shopping, cooking, gardening, meds PTA.      SLP Plan  Continue with current plan of care      Recommendations for follow up therapy are one component of a multi-disciplinary discharge planning process, led by the attending physician.  Recommendations may be updated based on patient status, additional functional criteria and insurance authorization.    Recommendations                         Frequent or constant Supervision/Assistance Aphasia (R47.01)     Continue with current plan of care    Gaither Biehn L. Samson Frederic, MA CCC/SLP Clinical Specialist - Acute Care SLP Acute Rehabilitation Services Office number [phone]  Blenda Mounts Laurice  06/13/2023, 10:15 AM

## 2023-06-13 NOTE — Progress Notes (Signed)
Inpatient Rehabilitation Admissions Coordinator   I met at bedside with patient , daughter, and friends. CIR bed is available to admit to today. Acute team and TOC made aware. I will make the arrangements.  Ottie Glazier, RN, MSN Rehab Admissions Coordinator [phone] 06/13/2023 11:47 AM

## 2023-06-13 NOTE — Discharge Instructions (Signed)
Information on my medicine - ELIQUIS (apixaban)  This medication education was reviewed with me or my healthcare representative as part of my discharge preparation.     Why was Eliquis prescribed for you? Eliquis was prescribed for you to reduce the risk of a blood clot forming that can cause a stroke if you have a medical condition called atrial fibrillation (a type of irregular heartbeat).  What do You need to know about Eliquis ? Take your Eliquis TWICE DAILY - one tablet in the morning and one tablet in the evening with or without food. If you have difficulty swallowing the tablet whole please discuss with your pharmacist how to take the medication safely.  Take Eliquis exactly as prescribed by your doctor and DO NOT stop taking Eliquis without talking to the doctor who prescribed the medication.  Stopping may increase your risk of developing a stroke.  Refill your prescription before you run out.  After discharge, you should have regular check-up appointments with your healthcare provider that is prescribing your Eliquis.  In the future your dose may need to be changed if your kidney function or weight changes by a significant amount or as you get older.  What do you do if you miss a dose? If you miss a dose, take it as soon as you remember on the same day and resume taking twice daily.  Do not take more than one dose of ELIQUIS at the same time to make up a missed dose.  Important Safety Information A possible side effect of Eliquis is bleeding. You should call your healthcare provider right away if you experience any of the following: Bleeding from an injury or your nose that does not stop. Unusual colored urine (red or dark ) or unusual colored stools (red or black). Unusual bruising for unknown reasons. A serious fall or if you hit your head (even if there is no bleeding).  Some medicines may interact with Eliquis and might increase your risk of bleeding or clotting  while on Eliquis. To help avoid this, consult your healthcare provider or pharmacist prior to using any new prescription or non-prescription medications, including herbals, vitamins, non-steroidal anti-inflammatory drugs (NSAIDs) and supplements.  This website has more information on Eliquis (apixaban): http://www.eliquis.com/eliquis/home =======================================  Atrial Fibrillation    Atrial fibrillation is a type of heartbeat that is irregular or fast. If you have this condition, your heart beats without any order. This makes it hard for your heart to pump blood in a normal way. Atrial fibrillation may come and go, or it may become a long-lasting problem. If this condition is not treated, it can put you at higher risk for stroke, heart failure, and other heart problems.  What are the causes? This condition may be caused by diseases that damage the heart. They include: High blood pressure. Heart failure. Heart valve disease. Heart surgery. Other causes include: Diabetes. Thyroid disease. Being overweight. Kidney disease. Sometimes the cause is not known.  What increases the risk? You are more likely to develop this condition if: You are older. You smoke. You exercise often and very hard. You have a family history of this condition. You are a man. You use drugs. You drink a lot of alcohol. You have lung conditions, such as emphysema, pneumonia, or COPD. You have sleep apnea.  What are the signs or symptoms? Common symptoms of this condition include: A feeling that your heart is beating very fast. Chest pain or discomfort. Feeling short of breath. Suddenly feeling   light-headed or weak. Getting tired easily during activity. Fainting. Sweating. In some cases, there are no symptoms.  How is this treated? Treatment for this condition depends on underlying conditions and how you feel when you have atrial fibrillation. They include: Medicines to: Prevent  blood clots. Treat heart rate or heart rhythm problems. Using devices, such as a pacemaker, to correct heart rhythm problems. Doing surgery to remove the part of the heart that sends bad signals. Closing an area where clots can form in the heart (left atrial appendage). In some cases, your doctor will treat other underlying conditions.  Follow these instructions at home:  Medicines Take over-the-counter and prescription medicines only as told by your doctor. Do not take any new medicines without first talking to your doctor. If you are taking blood thinners: Talk with your doctor before you take any medicines that have aspirin or NSAIDs, such as ibuprofen, in them. Take your medicine exactly as told by your doctor. Take it at the same time each day. Avoid activities that could hurt or bruise you. Follow instructions about how to prevent falls. Wear a bracelet that says you are taking blood thinners. Or, carry a card that lists what medicines you take. Lifestyle         Do not use any products that have nicotine or tobacco in them. These include cigarettes, e-cigarettes, and chewing tobacco. If you need help quitting, ask your doctor. Eat heart-healthy foods. Talk with your doctor about the right eating plan for you. Exercise regularly as told by your doctor. Do not drink alcohol. Lose weight if you are overweight. Do not use drugs, including cannabis.  General instructions If you have a condition that causes breathing to stop for a short period of time (apnea), treat it as told by your doctor. Keep a healthy weight. Do not use diet pills unless your doctor says they are safe for you. Diet pills may make heart problems worse. Keep all follow-up visits as told by your doctor. This is important.  Contact a doctor if: You notice a change in the speed, rhythm, or strength of your heartbeat. You are taking a blood-thinning medicine and you get more bruising. You get tired more easily  when you move or exercise. You have a sudden change in weight.  Get help right away if:    You have pain in your chest or your belly (abdomen). You have trouble breathing. You have side effects of blood thinners, such as blood in your vomit, poop (stool), or pee (urine), or bleeding that cannot stop. You have any signs of a stroke. "BE FAST" is an easy way to remember the main warning signs: B - Balance. Signs are dizziness, sudden trouble walking, or loss of balance. E - Eyes. Signs are trouble seeing or a change in how you see. F - Face. Signs are sudden weakness or loss of feeling in the face, or the face or eyelid drooping on one side. A - Arms. Signs are weakness or loss of feeling in an arm. This happens suddenly and usually on one side of the body. S - Speech. Signs are sudden trouble speaking, slurred speech, or trouble understanding what people say. T - Time. Time to call emergency services. Write down what time symptoms started. You have other signs of a stroke, such as: A sudden, very bad headache with no known cause. Feeling like you may vomit (nausea). Vomiting. A seizure.  These symptoms may be an emergency. Do not wait to   see if the symptoms will go away. Get medical help right away. Call your local emergency services (911 in the [place] Do not drive yourself to the hospital. Summary Atrial fibrillation is a type of heartbeat that is irregular or fast. You are at higher risk of this condition if you smoke, are older, have diabetes, or are overweight. Follow your doctor's instructions about medicines, diet, exercise, and follow-up visits. Get help right away if you have signs or symptoms of a stroke. Get help right away if you cannot catch your breath, or you have chest pain or discomfort. This information is not intended to replace advice given to you by your health care provider. Make sure you discuss any questions you have with your health care provider. Document  Revised: 12/25/2018 Document Reviewed: 12/25/2018 Elsevier Patient Education  2020 Elsevier Inc.    

## 2023-06-14 DIAGNOSIS — I10 Essential (primary) hypertension: Secondary | ICD-10-CM | POA: Diagnosis not present

## 2023-06-14 DIAGNOSIS — I63512 Cerebral infarction due to unspecified occlusion or stenosis of left middle cerebral artery: Secondary | ICD-10-CM | POA: Diagnosis not present

## 2023-06-14 DIAGNOSIS — I482 Chronic atrial fibrillation, unspecified: Secondary | ICD-10-CM

## 2023-06-14 LAB — COMPREHENSIVE METABOLIC PANEL
ALT: 18 U/L (ref 0–44)
AST: 30 U/L (ref 15–41)
Albumin: 3.3 g/dL — ABNORMAL LOW (ref 3.5–5.0)
Alkaline Phosphatase: 53 U/L (ref 38–126)
Anion gap: 5 (ref 5–15)
BUN: 6 mg/dL — ABNORMAL LOW (ref 8–23)
CO2: 25 mmol/L (ref 22–32)
Calcium: 8.8 mg/dL — ABNORMAL LOW (ref 8.9–10.3)
Chloride: 108 mmol/L (ref 98–111)
Creatinine, Ser: 0.49 mg/dL (ref 0.44–1.00)
GFR, Estimated: >60 mL/min (ref >60)
Glucose, Bld: 102 mg/dL — ABNORMAL HIGH (ref 70–99)
Potassium: 3.9 mmol/L (ref 3.5–5.1)
Sodium: 138 mmol/L (ref 135–145)
Total Bilirubin: 0.6 mg/dL (ref <1.2)
Total Protein: 6.4 g/dL — ABNORMAL LOW (ref 6.5–8.1)

## 2023-06-14 LAB — CBC WITH DIFFERENTIAL/PLATELET
Abs Immature Granulocytes: 0.04 10*3/uL (ref 0.00–0.07)
Basophils Absolute: 0 10*3/uL (ref 0.0–0.1)
Basophils Relative: 1 %
Eosinophils Absolute: 0.2 10*3/uL (ref 0.0–0.5)
Eosinophils Relative: 3 %
HCT: 43.7 % (ref 36.0–46.0)
Hemoglobin: 13.9 g/dL (ref 12.0–15.0)
Immature Granulocytes: 1 %
Lymphocytes Relative: 21 %
Lymphs Abs: 1.3 10*3/uL (ref 0.7–4.0)
MCH: 28.7 pg (ref 26.0–34.0)
MCHC: 31.8 g/dL (ref 30.0–36.0)
MCV: 90.1 fL (ref 80.0–100.0)
Monocytes Absolute: 0.7 10*3/uL (ref 0.1–1.0)
Monocytes Relative: 11 %
Neutro Abs: 4 10*3/uL (ref 1.7–7.7)
Neutrophils Relative %: 63 %
Platelets: 156 10*3/uL (ref 150–400)
RBC: 4.85 MIL/uL (ref 3.87–5.11)
RDW: 13.2 % (ref 11.5–15.5)
WBC: 6.3 10*3/uL (ref 4.0–10.5)
nRBC: 0 % (ref 0.0–0.2)

## 2023-06-14 MED ORDER — DILTIAZEM HCL ER COATED BEADS 240 MG PO CP24
240.0000 mg | ORAL_CAPSULE | Freq: Every day | ORAL | Status: DC
  Administered 2023-06-14 – 2023-06-18 (×5): 240 mg via ORAL
  Filled 2023-06-14 (×5): qty 1

## 2023-06-14 MED ORDER — ENSURE MAX PROTEIN PO LIQD
11.0000 [oz_av] | Freq: Every day | ORAL | Status: DC
  Administered 2023-06-14 – 2023-06-22 (×9): 11 [oz_av] via ORAL

## 2023-06-14 MED ORDER — METOPROLOL TARTRATE 50 MG PO TABS
50.0000 mg | ORAL_TABLET | Freq: Two times a day (BID) | ORAL | Status: DC
  Administered 2023-06-14 – 2023-06-22 (×16): 50 mg via ORAL
  Filled 2023-06-14 (×16): qty 1

## 2023-06-14 NOTE — Plan of Care (Signed)
  Problem: Consults Goal: RH STROKE PATIENT EDUCATION Description: See Patient Education module for education specifics  Outcome: Progressing   Problem: RH BOWEL ELIMINATION Goal: RH STG MANAGE BOWEL WITH ASSISTANCE Description: STG Manage Bowel with min Assistance. Outcome: Progressing   Problem: RH BLADDER ELIMINATION Goal: RH STG MANAGE BLADDER WITH ASSISTANCE Description: STG Manage Bladder With min Assistance Outcome: Progressing   Problem: RH SKIN INTEGRITY Goal: RH STG SKIN FREE OF INFECTION/BREAKDOWN Description: Incision will remain intact and free of infection/breakdown with min assist  Outcome: Progressing Goal: RH STG ABLE TO PERFORM INCISION/WOUND CARE W/ASSISTANCE Description: STG Able To Perform Incision/Wound Care With min Assistance. Outcome: Progressing   Problem: RH SAFETY Goal: RH STG ADHERE TO SAFETY PRECAUTIONS W/ASSISTANCE/DEVICE Description: STG Adhere to Safety Precautions With min Assistance/Device. Outcome: Progressing Goal: RH STG DECREASED RISK OF FALL WITH ASSISTANCE Description: STG Decreased Risk of Fall With min Assistance. Outcome: Progressing   Problem: RH PAIN MANAGEMENT Goal: RH STG PAIN MANAGED AT OR BELOW PT'S PAIN GOAL Description: Less than 4 with PRN medications Outcome: Progressing   Problem: RH KNOWLEDGE DEFICIT Goal: RH STG INCREASE KNOWLEDGE OF STROKE PROPHYLAXIS Description: Patient/caregiver will be able to manage new medications related to stroke and Afib from nursing education, nursing handouts and other resources independently  Outcome: Progressing

## 2023-06-14 NOTE — Progress Notes (Signed)
Inpatient Rehabilitation Center Individual Statement of Services  Patient Name:  Sara Donaldson  Date:  06/14/2023  Welcome to the Inpatient Rehabilitation Center.  Our goal is to provide you with an individualized program based on your diagnosis and situation, designed to meet your specific needs.  With this comprehensive rehabilitation program, you will be expected to participate in at least 3 hours of rehabilitation therapies Monday-Friday, with modified therapy programming on the weekends.  Your rehabilitation program will include the following services:  Physical Therapy (PT), Occupational Therapy (OT), Speech Therapy (ST), 24 hour per day rehabilitation nursing, Therapeutic Recreaction (TR), Neuropsychology, Care Coordinator, Rehabilitation Medicine, Nutrition Services, Pharmacy Services, and Other  Weekly team conferences will be held on Wednesdays to discuss your progress.  Your Inpatient Rehabilitation Care Coordinator will talk with you frequently to get your input and to update you on team discussions.  Team conferences with you and your family in attendance may also be held.  Expected length of stay: 10-14 Days  Overall anticipated outcome: MOD I to Supervision  Depending on your progress and recovery, your program may change. Your Inpatient Rehabilitation Care Coordinator will coordinate services and will keep you informed of any changes. Your Inpatient Rehabilitation Care Coordinator's name and contact numbers are listed  below.  The following services may also be recommended but are not provided by the Inpatient Rehabilitation Center:   Home Health Rehabiltiation Services Outpatient Rehabilitation Services    Arrangements will be made to provide these services after discharge if needed.  Arrangements include referral to agencies that provide these services.  Your insurance has been verified to be:  Medicare A & B Your primary doctor is:  Orpah Cobb, MD  Pertinent  information will be shared with your doctor and your insurance company.  Inpatient Rehabilitation Care Coordinator:  Nelva Hoar, [place] [phone] or [phone]  Information discussed with and copy given to patient by: Andria Rhein, 06/14/2023, 7:51 AM

## 2023-06-14 NOTE — Progress Notes (Signed)
Inpatient Rehabilitation Admission Medication Review by a Pharmacist  A complete drug regimen review was completed for this patient to identify any potential clinically significant medication issues.  High Risk Drug Classes Is patient taking? Indication by Medication  Antipsychotic No   Anticoagulant Yes Apixaban - AF  Antibiotic No   Opioid No   Antiplatelet Yes Asa - CVA  Hypoglycemics/insulin No   Vasoactive Medication Yes Diltiazem/lopressor -   Chemotherapy No   Other Yes Tylenol for pain Xanax - anxiety Pravachol - HLD     Type of Medication Issue Identified Description of Issue Recommendation(s)  Drug Interaction(s) (clinically significant)     Duplicate Therapy     Allergy     No Medication Administration End Date     Incorrect Dose     Additional Drug Therapy Needed     Significant med changes from prior encounter (inform family/care partners about these prior to discharge).    Other       Clinically significant medication issues were identified that warrant physician communication and completion of prescribed/recommended actions by midnight of the next day:  No  Name of provider notified for urgent issues identified:   Provider Method of Notification:     Pharmacist comments:   Time spent performing this drug regimen review (minutes):  20   Ulyses Southward, PharmD, [place], AAHIVP, CPP Infectious Disease Pharmacist 06/13/2023 12:41 PM

## 2023-06-14 NOTE — Progress Notes (Signed)
Lab called and stated sample hemolysis and needed redraw Lab tech called and notified

## 2023-06-14 NOTE — Consult Note (Signed)
Ref: Orpah Cobb, MD  Subjective:  Awake. Trying to use right use for feeding now. VS stable. HR in 90's.  Objective:  Vital Signs in the last 24 hours: Temp:  [98.1 F (36.7 C)-98.5 F (36.9 C)] 98.1 F (36.7 C) (11/28 0353) Pulse Rate:  [93-98] 98 (11/28 0353) Resp:  [15-18] 15 (11/27 1930) BP: (114-151)/(68-87) 150/87 (11/28 0353) SpO2:  [94 %-97 %] 97 % (11/28 0353) Weight:  [64.1 kg] 64.1 kg (11/27 1626)  Physical Exam: BP Readings from Last 1 Encounters:  06/14/23 (!) 150/87     Wt Readings from Last 1 Encounters:  06/13/23 64.1 kg    Weight change:  Body mass index is 26.7 kg/m. HEENT: [place]/AT, Eyes-Blue, Conjunctiva-Pink, Sclera-Non-icteric Neck: No JVD, No bruit, Trachea midline. Lungs:  Clear, Bilateral. Cardiac:  Irregular rhythm, normal S1 and S2, no S3. III/VI systolic murmur. Abdomen:  Soft, non-tender. BS present. Extremities:  No edema present. No cyanosis. No clubbing. CNS: AxOx2, Cranial nerves grossly intact, moves all 4 extremities. Right sided weakness with thick speech. Skin: Warm and dry.   Intake/Output from previous day: 11/27 0701 - 11/28 0700 In: 480 [P.O.:480] Out: -     Lab Results: BMET    Component Value Date/Time   NA 138 06/14/2023 0417   NA 138 06/13/2023 0519   NA 138 06/12/2023 0426   K 3.9 06/14/2023 0417   K 3.9 06/13/2023 0519   K 3.8 06/12/2023 0426   CL 108 06/14/2023 0417   CL 104 06/13/2023 0519   CL 105 06/12/2023 0426   CO2 25 06/14/2023 0417   CO2 24 06/13/2023 0519   CO2 24 06/12/2023 0426   GLUCOSE 102 (H) 06/14/2023 0417   GLUCOSE 112 (H) 06/13/2023 0519   GLUCOSE 104 (H) 06/12/2023 0426   BUN 6 (L) 06/14/2023 0417   BUN 9 06/13/2023 0519   BUN 14 06/12/2023 0426   CREATININE 0.49 06/14/2023 0417   CREATININE 0.61 06/13/2023 0519   CREATININE 0.65 06/12/2023 0426   CALCIUM 8.8 (L) 06/14/2023 0417   CALCIUM 9.0 06/13/2023 0519   CALCIUM 9.0 06/12/2023 0426   GFRNONAA >60 06/14/2023 0417   GFRNONAA  >60 06/13/2023 0519   GFRNONAA >60 06/12/2023 0426   GFRAA >60 06/08/2018 1632   GFRAA >60 05/27/2018 0925   GFRAA >90 03/07/2014 0410   CBC    Component Value Date/Time   WBC 6.3 06/14/2023 0556   RBC 4.85 06/14/2023 0556   HGB 13.9 06/14/2023 0556   HCT 43.7 06/14/2023 0556   PLT 156 06/14/2023 0556   MCV 90.1 06/14/2023 0556   MCH 28.7 06/14/2023 0556   MCHC 31.8 06/14/2023 0556   RDW 13.2 06/14/2023 0556   LYMPHSABS 1.3 06/14/2023 0556   MONOABS 0.7 06/14/2023 0556   EOSABS 0.2 06/14/2023 0556   BASOSABS 0.0 06/14/2023 0556   HEPATIC Function Panel Recent Labs    06/10/23 1219 06/14/23 0417  PROT 7.5 6.4*  ALBUMIN 4.1 3.3*  AST 31 30  ALT 19 18  ALKPHOS 67 53   HEMOGLOBIN A1C Lab Results  Component Value Date   MPG 108.28 06/10/2023   CARDIAC ENZYMES No results found for: "CKTOTAL", "CKMB", "CKMBINDEX", "TROPONINI" BNP No results for input(s): "PROBNP" in the last 8760 hours. TSH No results for input(s): "TSH" in the last 8760 hours. CHOLESTEROL Recent Labs    06/11/23 0538  CHOL 117    Scheduled Meds:  apixaban  2.5 mg Oral BID   aspirin EC  81 mg Oral  Daily   diltiazem  240 mg Oral Daily   metoprolol tartrate  50 mg Oral BID   pravastatin  40 mg Oral q1800   Ensure Max Protein  11 oz Oral Daily   Continuous Infusions: PRN Meds:.acetaminophen **OR** acetaminophen (TYLENOL) oral liquid 160 mg/5 mL **OR** acetaminophen, ALPRAZolam  Assessment/Plan: Acute left MCA stroke, embolic Atrial fibrillation, [id number] score of 6 HTN HLD Obesity S/P AV replacement, 21 mm Edwards Pericardial  Plan: Change diltiazem to one a day 240 mg. And increase metoprolol to 50 mg. bid. Continue Eliquis and Aspirin. Increase activity per rehab.   LOS: 1 day   Time spent including chart review, lab review, examination, discussion with patient/Nurse/Family : 30 min   Orpah Cobb  MD  06/14/2023, 11:15 AM

## 2023-06-14 NOTE — Progress Notes (Signed)
Inpatient Rehabilitation Care Coordinator Assessment and Plan Patient Details  Name: Sara Donaldson MRN: [id number] Date of Birth: [birth year]  Today's Date: 06/14/2023  Hospital Problems: Principal Problem:   Left middle cerebral artery stroke [facility])  Past Medical History:  Past Medical History:  Diagnosis Date   Aortic stenosis    Bronchitis    Heart murmur    Hypercholesterolemia    Hypertension    Thrombocytopenia (HCC) 03/05/2014   Past Surgical History:  Past Surgical History:  Procedure Laterality Date   AORTIC VALVE REPLACEMENT N/A 03/02/2014   Procedure: AORTIC VALVE REPLACEMENT (AVR);  Surgeon: Loreli Slot, MD;  Location: [facility] OR;  Service: Open Heart Surgery;  Laterality: N/A;   CARDIAC CATHETERIZATION     4/10 2015   EYE SURGERY     IR CT HEAD LTD  06/10/2023   IR PERCUTANEOUS ART THROMBECTOMY/INFUSION INTRACRANIAL INC DIAG ANGIO  06/10/2023   LEFT HEART CATHETERIZATION WITH CORONARY ANGIOGRAM N/A 10/24/2013   Procedure: LEFT HEART CATHETERIZATION WITH CORONARY ANGIOGRAM;  Surgeon: Ricki Rodriguez, MD;  Location: [facility] CATH LAB;  Service: Cardiovascular;  Laterality: N/A;   RADIOLOGY WITH ANESTHESIA N/A 06/10/2023   Procedure: IR WITH ANESTHESIA;  Surgeon: Radiologist, Medication, MD;  Location: [facility] OR;  Service: Radiology;  Laterality: N/A;   TEE WITHOUT CARDIOVERSION N/A 01/12/2014   Procedure: TRANSESOPHAGEAL ECHOCARDIOGRAM (TEE);  Surgeon: Ricki Rodriguez, MD;  Location: [facility] ENDOSCOPY;  Service: Cardiovascular;  Laterality: N/A;   Social History:  reports that she has never smoked. She has never used smokeless tobacco. She reports that she does not drink alcohol and does not use drugs.  Family / Support Systems Marital Status: Widow/Widower How Long?: n/a Patient Roles: Parent, Spouse Spouse/Significant Other: n/a Children: Martyn Ehrich, Daughter Other Supports: Granddaughter, Oceanographer Anticipated Caregiver: Daughter and granddaughter Ability/Limitations of  Caregiver: none reported Caregiver Availability: 24/7 Family Dynamics: supportive daughter, granddaughter also assisting  Social History Preferred language: English Religion: Baptist Cultural Background: n/a Education: HS Health Literacy - How often do you need to have someone help you when you read instructions, pamphlets, or other written material from your doctor or pharmacy?: Rarely Writes: Yes Employment Status: Retired Date Retired/Disabled/Unemployed: n/a Marine scientist Issues: n/a Guardian/Conservator: Martyn Ehrich   Abuse/Neglect Abuse/Neglect Assessment Can Be Completed: Yes Physical Abuse: Denies Verbal Abuse: Denies Sexual Abuse: Denies Exploitation of patient/patient's resources: Denies Self-Neglect: Denies  Patient response to: Social Isolation - How often do you feel lonely or isolated from those around you?: Never  Emotional Status Pt's affect, behavior and adjustment status: Patient & daughter pleasant, met at bedside, Recent Psychosocial Issues: Coping Psychiatric History: none Substance Abuse History: n/a  Patient / Family Perceptions, Expectations & Goals Pt/Family understanding of illness & functional limitations: Sw met with pt and daughter at bedside Premorbid pt/family roles/activities: Independnt prior and living alone Anticipated changes in roles/activities/participation: Daughter and granddaughter anticipate providing assistance and supervision 24/7 Pt/family expectations/goals: MOD I to Supervision  [employer] Agencies: None Premorbid Home Care/DME Agencies: None Transportation available at discharge: Patient driving prior. Daughter able to transport Is the patient able to respond to transportation needs?: Yes In the past 12 months, has lack of transportation kept you from medical appointments or from getting medications?: No In the past 12 months, has lack of transportation kept you from meetings, work, or from  getting things needed for daily living?: No  Discharge Planning Living Arrangements: Alone Support Systems: Children Type of Residence: Private residence (1 level home, 1 step) [employer]  Resources: [employer], Media planner (specify) ([employer]BS SUPP) Financial Resources: Restaurant manager, fast food Screen Referred: No Living Expenses: Own Money Management: Patient Does the patient have any problems obtaining your medications?: No Home Management: Independent Patient/Family Preliminary Plans: Plans to remian independent with assistance from daughter, if needed. Care Coordinator Anticipated Follow Up Needs: HH/OP Expected length of stay: 10-14 Days  Clinical Impression SW met with patient and daughter, Kendal Hymen at bedside. SW introduced self and explained role. Sw informed pt and daughter therapies will continue tomorrow. Patient anticipates discharging back home with assistance from daughter and granddaughter. Daughter, Kendal Hymen and granddaughter anticipate providing 24/7. 1 level home, 1 step to enter. Patient has no DME. No additional questions or concerns.  JONICA PHILLIP 06/14/2023, 9:05 AM

## 2023-06-14 NOTE — Progress Notes (Signed)
PROGRESS NOTE   Subjective/Complaints: Pt had a good night. Slept well. Daughter in room. Still having issues with speech and vision on right.   ROS: Patient denies fever, rash, sore throat, blurred vision, dizziness, nausea, vomiting, diarrhea, cough, shortness of breath or chest pain, joint or back/neck pain, headache, or mood change.    Objective:   ECHOCARDIOGRAM COMPLETE  Result Date: 06/12/2023    ECHOCARDIOGRAM REPORT   Patient Name:   Sara Donaldson Date of Exam: 06/12/2023 Medical Rec #:  [id number]    Height:       61.0 in Accession #:    [id number]   Weight:       144.6 lb Date of Birth:  04-29-1930    BSA:          1.646 m Patient Age:    89 years     BP:           105/60 mmHg Patient Gender: F            HR:           115 bpm. Exam Location:  Inpatient Procedure: 2D Echo Indications:     I48.0 Paroxysmal atrial fibrillation  History:         Patient has prior history of Echocardiogram examinations. Prior                  Cardiac Surgery, Stroke, Aortic Valve Disease and Mitral Valve                  Disease, Arrythmias:Atrial Fibrillation,                  Signs/Symptoms:Hypertensive Heart Disease; Risk                  Factors:Hypertension.                  Aortic Valve: 21 mm Edwards pericardial valve is present in the                  aortic position.  Sonographer:     Dondra Prader RVT Referring Phys:  Terrilee Files [facility] Diagnosing Phys: Orpah Cobb MD IMPRESSIONS  1. Left ventricular ejection fraction, by estimation, is 55 to 60%. The left ventricle has normal function. The left ventricle has no regional wall motion abnormalities. Left ventricular diastolic parameters are indeterminate.  2. Right ventricular systolic function is low normal. The right ventricular size is normal.  3. Left atrial size was moderately dilated.  4. Right atrial size was moderately dilated.  5. The mitral valve is degenerative. Mild to moderate mitral valve  regurgitation. Moderate mitral annular calcification.  6. Tricuspid valve regurgitation is moderate to severe.  7. The aortic valve has been repaired/replaced. There is mild calcification of the aortic valve. There is mild thickening of the aortic valve. Aortic valve regurgitation is not visualized. Aortic valve sclerosis is present, with no evidence of aortic valve stenosis. There is a 21 mm Edwards pericardial valve present in the aortic position.  8. There is Moderate (Grade III) atheroma plaque involving the aortic root and ascending aorta.  9. The inferior vena cava is dilated  in size with <50% respiratory variability, suggesting right atrial pressure of 15 mmHg. FINDINGS  Left Ventricle: Left ventricular ejection fraction, by estimation, is 55 to 60%. The left ventricle has normal function. The left ventricle has no regional wall motion abnormalities. The left ventricular internal cavity size was normal in size. There is  borderline concentric left ventricular hypertrophy. Left ventricular diastolic parameters are indeterminate. Right Ventricle: The right ventricular size is normal. No increase in right ventricular wall thickness. Right ventricular systolic function is low normal. Left Atrium: Left atrial size was moderately dilated. Right Atrium: Right atrial size was moderately dilated. Pericardium: There is no evidence of pericardial effusion. Mitral Valve: The mitral valve is degenerative in appearance. Moderate mitral annular calcification. Mild to moderate mitral valve regurgitation. Tricuspid Valve: The tricuspid valve is normal in structure. Tricuspid valve regurgitation is moderate to severe. Aortic Valve: The aortic valve has been repaired/replaced. There is mild calcification of the aortic valve. There is mild thickening of the aortic valve. There is moderate to severe aortic valve annular calcification. Aortic valve regurgitation is not visualized. Aortic valve sclerosis is present, with no  evidence of aortic valve stenosis. Aortic valve mean gradient measures 5.0 mmHg. Aortic valve peak gradient measures 9.5 mmHg. Aortic valve area, by VTI measures 2.19 cm. There is a 21 mm Edwards pericardial valve present in the aortic position. Pulmonic Valve: The pulmonic valve was normal in structure. Pulmonic valve regurgitation is not visualized. Aorta: The aortic root is normal in size and structure. There is moderate (Grade III) atheroma plaque involving the aortic root and ascending aorta. Venous: The inferior vena cava is dilated in size with less than 50% respiratory variability, suggesting right atrial pressure of 15 mmHg. IAS/Shunts: The atrial septum is grossly normal.  LEFT VENTRICLE PLAX 2D LVIDd:         3.80 cm LVIDs:         2.60 cm LV PW:         0.90 cm LV IVS:        1.10 cm LVOT diam:     1.80 cm LV SV:         43 LV SV Index:   26 LVOT Area:     2.54 cm  IVC IVC diam: 2.10 cm LEFT ATRIUM           Index        RIGHT ATRIUM           Index LA diam:      3.50 cm 2.13 cm/m   RA Area:     17.60 cm LA Vol (A4C): 68.9 ml 41.87 ml/m  RA Volume:   48.30 ml  29.35 ml/m  AORTIC VALVE                    PULMONIC VALVE AV Area (Vmax):    1.98 cm     PV Vmax:       0.88 m/s AV Area (Vmean):   1.93 cm     PV Peak grad:  3.1 mmHg AV Area (VTI):     2.19 cm AV Vmax:           154.00 cm/s AV Vmean:          99.333 cm/s AV VTI:            0.198 m AV Peak Grad:      9.5 mmHg AV Mean Grad:      5.0 mmHg LVOT Vmax:  120.00 cm/s LVOT Vmean:        75.300 cm/s LVOT VTI:          0.170 m LVOT/AV VTI ratio: 0.86  AORTA Ao Root diam: 2.30 cm Ao Asc diam:  3.70 cm MITRAL VALVE                TRICUSPID VALVE MV Area (PHT): 5.50 cm     TR Peak grad:   39.2 mmHg MV Decel Time: 138 msec     TR Vmax:        313.00 cm/s MV E velocity: 146.00 cm/s                             SHUNTS                             Systemic VTI:  0.17 m                             Systemic Diam: 1.80 cm Orpah Cobb MD  Electronically signed by Orpah Cobb MD Signature Date/Time: 06/12/2023/8:58:34 AM    Final    Recent Labs    06/13/23 0519 06/14/23 0556  WBC 6.4 6.3  HGB 12.9 13.9  HCT 39.9 43.7  PLT 156 156   Recent Labs    06/13/23 0519 06/14/23 0417  NA 138 138  K 3.9 3.9  CL 104 108  CO2 24 25  GLUCOSE 112* 102*  BUN 9 6*  CREATININE 0.61 0.49  CALCIUM 9.0 8.8*    Intake/Output Summary (Last 24 hours) at 06/14/2023 0831 Last data filed at 06/13/2023 1627 Gross per 24 hour  Intake 480 ml  Output --  Net 480 ml        Physical Exam: Vital Signs Blood pressure (!) 150/87, pulse 98, temperature 98.1 F (36.7 C), temperature source Oral, resp. rate 15, height 5\' 1"  (1.549 m), weight 64.1 kg, SpO2 97%.  General: Alert and oriented x 3, No apparent distress HEENT: Head is normocephalic, atraumatic, PERRLA, EOMI, sclera anicteric, oral mucosa pink and moist, dentition intact, ext ear canals clear,  Neck: Supple without JVD or lymphadenopathy Heart: Reg rate and rhythm. No murmurs rubs or gallops Chest: CTA bilaterally without wheezes, rales, or rhonchi; no distress Abdomen: Soft, non-tender, non-distended, bowel sounds positive. Extremities: No clubbing, cyanosis, or edema. Pulses are 2+ Psych: Pt's affect is appropriate. Pt is cooperative Skin: Clean and intact without signs of breakdown Neuro:  Alert and oriented x 3. Normal insight and awareness. Intact Memory. Normal language. Sl dysarthric speech. Cranial nerve exam notable for RUQ vision loss, mild right central 7. MMT: RUE 4- to 4/5 prox to distal. RLE 4- to 4/5. LUE and LLE 5/5.  Sensory exam normal for light touch and pain in all 4 limbs. No limb ataxia or cerebellar signs. No abnormal tone appreciated.  Musculoskeletal: Full ROM, No pain with AROM or PROM in the neck, trunk, or extremities. Posture appropriate     Assessment/Plan: 1. Functional deficits which require 3+ hours per day of interdisciplinary therapy in a  comprehensive inpatient rehab setting. Physiatrist is providing close team supervision and 24 hour management of active medical problems listed below. Physiatrist and rehab team continue to assess barriers to discharge/monitor patient progress toward functional and medical goals  Care Tool:  Bathing  Bathing assist       Upper Body Dressing/Undressing Upper body dressing        Upper body assist      Lower Body Dressing/Undressing Lower body dressing            Lower body assist       Toileting Toileting    Toileting assist       Transfers Chair/bed transfer  Transfers assist           Locomotion Ambulation   Ambulation assist              Walk 10 feet activity   Assist           Walk 50 feet activity   Assist           Walk 150 feet activity   Assist           Walk 10 feet on uneven surface  activity   Assist           Wheelchair     Assist               Wheelchair 50 feet with 2 turns activity    Assist            Wheelchair 150 feet activity     Assist          Blood pressure (!) 150/87, pulse 98, temperature 98.1 F (36.7 C), temperature source Oral, resp. rate 15, height 5\' 1"  (1.549 m), weight 64.1 kg, SpO2 97%.  Medical Problem List and Plan: 1. Functional deficits secondary to left MCA infarct with left MCA M1 occlusion status post thrombectomy             -patient may  shower             -ELOS/Goals: 10-14 days, Mod I to sup with PT/OT/SLP             -Patient is beginning CIR therapies today including PT and OT  2.  Antithrombotics: -DVT/anticoagulation:  Pharmaceutical: Eliquis 2.5 mg twice daily, Eliquis lifelong             -antiplatelet therapy: Aspirin 81 mg daily 3. Pain Management: Tylenol as needed 4. Mood/Behavior/Sleep: Xanax 0.25 mg nightly as needed             -antipsychotic agents: N/A 5. Neuropsych/cognition: This patient is capable of  making decisions on her own behalf. 6. Skin/Wound Care: Routine skin checks 7. Fluids/Electrolytes/Nutrition: encourage PO  I personally reviewed the patient's labs today.  WNL  -protein supp for low albumin 8.  Atrial fibrillation as well as history of aortic valve repair.  .  Cardizem 60 mg 4 times daily, Lopressor 25 mg twice daily.  Cardiac rate controlled.  Continue Eliquis as directed             -Consider clarifying with cardiology plan for once a day diltiazem?  -HR controlled in 80's to 90's on auscultation  -observe as she gets moving with therapy 9.  Hyperlipidemia.  Pravachol.  No high intensity statin due to LDL at goal 10.  Hypertension.  Long-term goal normotensive.  Home medications amlodipine 5 mg, Lasix 20 mg, hydralazine 25 mg, lisinopril 5 mg, losartan 100 mg             -Continue 25 mg metoprolol twice daily  -11/28 bp borderline/high: no changes at present to avoid hypoperfusion    LOS: 1 days A FACE TO FACE EVALUATION WAS PERFORMED  Ranelle Oyster 06/14/2023, 8:31 AM

## 2023-06-15 DIAGNOSIS — I10 Essential (primary) hypertension: Secondary | ICD-10-CM | POA: Diagnosis not present

## 2023-06-15 DIAGNOSIS — I482 Chronic atrial fibrillation, unspecified: Secondary | ICD-10-CM | POA: Diagnosis not present

## 2023-06-15 DIAGNOSIS — I63512 Cerebral infarction due to unspecified occlusion or stenosis of left middle cerebral artery: Secondary | ICD-10-CM | POA: Diagnosis not present

## 2023-06-15 NOTE — Progress Notes (Signed)
Occupational Therapy Session Note  Patient Details  Name: Sara Donaldson MRN: [id number] Date of Birth: 09/15/1929  {CHL IP REHAB OT TIME CALCULATIONS:[id number]}   Short Term Goals: Week 1:  OT Short Term Goal 1 (Week 1): STG=LTG  Skilled Therapeutic Interventions/Progress Updates:  Pt greeted *** for skilled OT session with focus on ***.   Pain: Pt reported ***/10 pain, stating "***" in reference to ***. OT offering intermediate rest breaks and positioning suggestions throughout session to address pain/fatigue and maximize participation/safety in session.   Functional Transfers:  Self Care Tasks:  Therapeutic Activities:  Therapeutic Exercise:   Education:  Pt remained *** with 4Ps assessed and immediate needs met. Pt continues to be appropriate for skilled OT intervention to promote further functional independence in ADLs/IADLs.    Therapy Documentation Precautions:  Precautions Precautions: Fall Precaution Comments: mild R inattention and hemipareisis Restrictions Weight Bearing Restrictions: No   Therapy/Group: Individual Therapy  Lou Cal, OTR/L, MSOT  06/15/2023, 11:30 PM

## 2023-06-15 NOTE — Progress Notes (Signed)
Inpatient Rehabilitation  Patient information reviewed and entered into eRehab system by Demarrio Menges Gentry, OTR/L, Rehab Quality Coordinator.   Information including medical coding, functional ability and quality indicators will be reviewed and updated through discharge.   

## 2023-06-15 NOTE — Plan of Care (Signed)
  Problem: RH Expression Communication Goal: LTG Patient will increase speech intelligibility (SLP) Description: LTG: Patient will increase speech intelligibility at word/phrase/conversation level with cues, % of the time (SLP) Flowsheets (Taken 06/15/2023 1229) LTG: Patient will increase speech intelligibility (SLP): Minimal Assistance - Patient > 75% Level: Conversation level Percent of time patient will use intelligible speech: 80% Goal: LTG Patient will increase word finding of common (SLP) Description: LTG:  Patient will increase word finding of common objects/daily info/abstract thoughts with cues using compensatory strategies (SLP). Flowsheets (Taken 06/15/2023 1229) LTG: Patient will increase word finding of common (SLP): Minimal Assistance - Patient > 75% Patient will use compensatory strategies to increase word finding of: Daily info   Problem: RH Problem Solving Goal: LTG Patient will demonstrate problem solving for (SLP) Description: LTG:  Patient will demonstrate problem solving for basic/complex daily situations with cues  (SLP) Flowsheets (Taken 06/15/2023 1229) LTG: Patient will demonstrate problem solving for (SLP): Basic daily situations LTG Patient will demonstrate problem solving for: Minimal Assistance - Patient > 75%   Problem: RH Memory Goal: LTG Patient will use memory compensatory aids to (SLP) Description: LTG:  Patient will use memory compensatory aids to recall biographical/new, daily complex information with cues (SLP) Flowsheets (Taken 06/15/2023 1229) LTG: Patient will use memory compensatory aids to (SLP): Minimal Assistance - Patient > 75%   Problem: RH Awareness Goal: LTG: Patient will demonstrate awareness during functional activites type of (SLP) Description: LTG: Patient will demonstrate awareness during functional activites type of (SLP) Flowsheets (Taken 06/15/2023 1229) Patient will demonstrate during cognitive/linguistic activities awareness type  of: Emergent LTG: Patient will demonstrate awareness during cognitive/linguistic activities with assistance of (SLP): Minimal Assistance - Patient > 75%

## 2023-06-15 NOTE — Progress Notes (Signed)
PROGRESS NOTE   Subjective/Complaints: No new issues. Up early with therapy. Denies h/a or pain  ROS: Patient denies fever, rash, sore throat,  dizziness, nausea, vomiting, diarrhea, cough, shortness of breath or chest pain, joint or back/neck pain, headache, or mood change.    Objective:   No results found. Recent Labs    06/13/23 0519 06/14/23 0556  WBC 6.4 6.3  HGB 12.9 13.9  HCT 39.9 43.7  PLT 156 156   Recent Labs    06/13/23 0519 06/14/23 0417  NA 138 138  K 3.9 3.9  CL 104 108  CO2 24 25  GLUCOSE 112* 102*  BUN 9 6*  CREATININE 0.61 0.49  CALCIUM 9.0 8.8*    Intake/Output Summary (Last 24 hours) at 06/15/2023 1014 Last data filed at 06/15/2023 0747 Gross per 24 hour  Intake 560 ml  Output --  Net 560 ml        Physical Exam: Vital Signs Blood pressure (!) 172/95, pulse 88, temperature 98.4 F (36.9 C), resp. rate 18, height 5\' 1"  (1.549 m), weight 64.1 kg, SpO2 97%.  Constitutional: No distress . Vital signs reviewed. HEENT: NCAT, EOMI, oral membranes moist Neck: supple Cardiovascular: RRR without murmur. No JVD    Respiratory/Chest: CTA Bilaterally without wheezes or rales. Normal effort    GI/Abdomen: BS +, non-tender, non-distended Ext: no clubbing, cyanosis, or edema Psych: pleasant and cooperative  Skin: Clean and intact without signs of breakdown Neuro:  Alert and oriented x 3. Normal insight and awareness. Intact Memory. Normal language. Sl dysarthric speech. Cranial nerve exam with persistent RUQ vision loss, mild right central 7. MMT: RUE 4- to 4/5 prox to distal. RLE 4- to 4/5. LUE and LLE 5/5.  Sensory exam normal for light touch and pain in all 4 limbs. No limb ataxia or cerebellar signs. No abnormal tone seen.  Musculoskeletal: Full ROM, No pain with AROM or PROM in the neck, trunk, or extremities. Posture appropriate     Assessment/Plan: 1. Functional deficits which require 3+  hours per day of interdisciplinary therapy in a comprehensive inpatient rehab setting. Physiatrist is providing close team supervision and 24 hour management of active medical problems listed below. Physiatrist and rehab team continue to assess barriers to discharge/monitor patient progress toward functional and medical goals  Care Tool:  Bathing              Bathing assist       Upper Body Dressing/Undressing Upper body dressing        Upper body assist      Lower Body Dressing/Undressing Lower body dressing            Lower body assist       Toileting Toileting    Toileting assist       Transfers Chair/bed transfer  Transfers assist           Locomotion Ambulation   Ambulation assist              Walk 10 feet activity   Assist           Walk 50 feet activity   Assist  Walk 150 feet activity   Assist           Walk 10 feet on uneven surface  activity   Assist           Wheelchair     Assist               Wheelchair 50 feet with 2 turns activity    Assist            Wheelchair 150 feet activity     Assist          Blood pressure (!) 172/95, pulse 88, temperature 98.4 F (36.9 C), resp. rate 18, height 5\' 1"  (1.549 m), weight 64.1 kg, SpO2 97%.  Medical Problem List and Plan: 1. Functional deficits secondary to left MCA infarct with left MCA M1 occlusion status post thrombectomy             -patient may  shower             -ELOS/Goals: 10-14 days, Mod I to sup with PT/OT/SLP             -Continue CIR therapies including PT, OT, and SLP 2.  Antithrombotics: -DVT/anticoagulation:  Pharmaceutical: Eliquis 2.5 mg twice daily, Eliquis lifelong             -antiplatelet therapy: Aspirin 81 mg daily 3. Pain Management: Tylenol as needed 4. Mood/Behavior/Sleep: Xanax 0.25 mg nightly as needed             -antipsychotic agents: N/A 5. Neuropsych/cognition: This patient is  capable of making decisions on her own behalf. 6. Skin/Wound Care: Routine skin checks 7. Fluids/Electrolytes/Nutrition: encourage PO  I personally reviewed the patient's labs today.  WNL  -protein supp for low albumin 8.  Atrial fibrillation as well as history of aortic valve repair.  .  Cardizem 60 mg 4 times daily, Lopressor 25 mg twice daily.  Cardiac rate controlled.  Continue Eliquis as directed             -Consider clarifying with cardiology plan for once a day diltiazem?  -11/29 HR in 80's to 90's   -cardiology changed to diltiazem ER and increased metoprolol to 50mg  bid   -monitor with activity 9.  Hyperlipidemia.  Pravachol.  No high intensity statin due to LDL at goal 10.  Hypertension.  Long-term goal normotensive.  Home medications amlodipine 5 mg, Lasix 20 mg, hydralazine 25 mg, lisinopril 5 mg, losartan 100 mg             -11/29- metoprolol increased as above.   -observe today, may need further titration    LOS: 2 days A FACE TO FACE EVALUATION WAS PERFORMED  Ranelle Oyster 06/15/2023, 10:14 AM

## 2023-06-15 NOTE — Evaluation (Signed)
Physical Therapy Assessment and Plan  Patient Details  Name: Sara Donaldson MRN: [id number] Date of Birth: 05/02/1930  PT Diagnosis: Difficulty walking, Hemiparesis dominant, Impaired sensation, and Muscle weakness Rehab Potential: Good ELOS: 7-9 days   Today's Date: 06/15/2023 PT Individual Time: 0806-0902 PT Individual Time Calculation (min): 56 min    Hospital Problem: Principal Problem:   Left middle cerebral artery stroke [facility])   Past Medical History:  Past Medical History:  Diagnosis Date   Aortic stenosis    Bronchitis    Heart murmur    Hypercholesterolemia    Hypertension    Thrombocytopenia (HCC) 03/05/2014   Past Surgical History:  Past Surgical History:  Procedure Laterality Date   AORTIC VALVE REPLACEMENT N/A 03/02/2014   Procedure: AORTIC VALVE REPLACEMENT (AVR);  Surgeon: Loreli Slot, MD;  Location: [facility] OR;  Service: Open Heart Surgery;  Laterality: N/A;   CARDIAC CATHETERIZATION     4/10 2015   EYE SURGERY     IR CT HEAD LTD  06/10/2023   IR PERCUTANEOUS ART THROMBECTOMY/INFUSION INTRACRANIAL INC DIAG ANGIO  06/10/2023   LEFT HEART CATHETERIZATION WITH CORONARY ANGIOGRAM N/A 10/24/2013   Procedure: LEFT HEART CATHETERIZATION WITH CORONARY ANGIOGRAM;  Surgeon: Ricki Rodriguez, MD;  Location: [facility] CATH LAB;  Service: Cardiovascular;  Laterality: N/A;   RADIOLOGY WITH ANESTHESIA N/A 06/10/2023   Procedure: IR WITH ANESTHESIA;  Surgeon: Radiologist, Medication, MD;  Location: [facility] OR;  Service: Radiology;  Laterality: N/A;   TEE WITHOUT CARDIOVERSION N/A 01/12/2014   Procedure: TRANSESOPHAGEAL ECHOCARDIOGRAM (TEE);  Surgeon: Ricki Rodriguez, MD;  Location: [facility] ENDOSCOPY;  Service: Cardiovascular;  Laterality: N/A;    Assessment & Plan Clinical Impression: Patient is a 89 y.o. right-handed female with history significant for hypertension, obesity with BMI 27.33, tachycardia, anxiety, aortic stenosis with aortic valve repair 03/02/2014 maintained on aspirin daily.   Per chart review patient lives alone.  1 level home one-step to entry.  Independent driving prior to admission.  She does have excellent support in the area.  Presented 06/10/2023 after being found down by her daughter with right sided weakness as well as right gaze preference with dysarthria.  Admission chemistries unremarkable except WBC 14,000, glucose 153, hemoglobin A1c 5.4.  Cranial CT scan showed hyperdense left MCA and evidence of left basal ganglia cytotoxic edema.  No acute hemorrhage identified.  CTA head and neck positive for left MCA M1 occlusion.  Generalized arterial tortuosity in the head and neck.  Bilateral carotid atherosclerosis with up to 50% stenosis on the left.  No significant posterior circulation stenosis.  Underwent thrombectomy of left M1 occlusion per interventional radiology 11/24.  MRI follow-up 06/11/2023 shows acute infarct in the left putamen, anterior greater than posterior and left caudate head.  Additional small cortical infarct in the more superior left frontal lobe.  Echocardiogram with ejection fraction of 55 to 60% no wall motion abnormalities.  Hospital course cardiology services consulted 11/26 after telemetry monitor showed atrial fibrillation with moderate ventricular response.  She was placed on Cardizem as well as low-dose beta-blocker.  Patient currently maintained on low-dose aspirin and Eliquis 2.5 mg BID. Tolerating a regular consistency diet.  Plan for lifelong Eliquis treatment.  Patient was seen by cardiology, consider one a day diltiazem 240 mg in 1 to 2 days. Therapy evaluations completed due to patient decreased functional mobility right side weakness was admitted for a comprehensive rehab program.  Patient transferred to CIR on 06/13/2023 .   Patient currently requires min assist with  mobility secondary to muscle weakness, decreased cardiorespiratoy endurance, unbalanced muscle activation, decreased attention, decreased awareness, and decreased memory, and  decreased standing balance, decreased balance strategies, and hemipareisis .  Prior to hospitalization, patient was independent  with mobility and lived with Alone in a one-level House.  Home access is 1Stairs to enter.  Patient will benefit from skilled PT intervention to maximize safe functional mobility, minimize fall risk, and decrease caregiver burden for planned discharge home with 24 hour supervision.  Anticipate patient will benefit from follow up OP at discharge.  PT - End of Session Activity Tolerance: Tolerates 30+ min activity with multiple rests Endurance Deficit: Yes PT Assessment Rehab Potential (ACUTE/IP ONLY): Good PT Barriers to Discharge: Inaccessible home environment;Decreased caregiver support;Home environment access/layout;Nutrition means PT Patient demonstrates impairments in the following area(s): Balance;Endurance;Motor;Nutrition;Perception;Safety;Sensory PT Transfers Functional Problem(s): Bed Mobility;Bed to Chair;Car;Furniture PT Locomotion Functional Problem(s): Ambulation;Stairs PT Plan PT Intensity: Minimum of 1-2 x/day ,45 to 90 minutes PT Frequency: 5 out of 7 days PT Duration Estimated Length of Stay: 7-9 days PT Treatment/Interventions: Ambulation/gait training;Community reintegration;DME/adaptive equipment instruction;Neuromuscular re-education;Psychosocial support;Stair training;UE/LE Strength taining/ROM;Balance/vestibular training;Discharge planning;Therapeutic Activities;UE/LE Coordination activities;Cognitive remediation/compensation;Disease management/prevention;Functional mobility training;Patient/family education;Splinting/orthotics;Therapeutic Exercise PT Transfers Anticipated Outcome(s): Mod I PT Locomotion Anticipated Outcome(s): supervision/ Mod I PT Recommendation Recommendations for Other Services: Therapeutic Recreation consult Therapeutic Recreation Interventions: Kitchen group;Stress management Follow Up Recommendations: Outpatient PT;24  hour supervision/assistance Patient destination: Home Equipment Recommended: To be determined   PT Evaluation Precautions/Restrictions Precautions Precautions: Fall Precaution Comments: mild R inattention and hemipareisis Restrictions Weight Bearing Restrictions: No General   Vital Signs  Pain Pain Assessment Pain Scale: 0-10 Pain Score: 0-No pain Pain Interference Pain Interference Pain Effect on Sleep: 1. Rarely or not at all Pain Interference with Therapy Activities: 1. Rarely or not at all Pain Interference with Day-to-Day Activities: 1. Rarely or not at all Home Living/Prior Functioning Home Living Available Help at Discharge: Family;Available 24 hours/day Type of Home: House Home Access: Stairs to enter [employer] of Steps: 1 Entrance Stairs-Rails: None Home Layout: One level Bathroom Shower/Tub: Health visitor: Standard Bathroom Accessibility: Yes  Lives With: Alone Prior Function Level of Independence: Independent with basic ADLs;Independent with homemaking with ambulation;Independent with gait;Independent with transfers (does light cooking - mostly with microwave)  Able to Take Stairs?: Yes Driving: Yes Vocation: Retired Vision/Perception  Vision - History Ability to See in Adequate Light: 0 Adequate Vision - Assessment Eye Alignment: Within Functional Limits Ocular Range of Motion: Within Functional Limits Tracking/Visual Pursuits: Decreased smoothness of horizontal tracking Saccades: Decreased speed of saccadic movement Convergence: Within functional limits Perception Perception: Impaired Preception Impairment Details: Inattention/Neglect Perception-Other Comments: mild right inattention Praxis Praxis: Impaired Praxis Impairment Details: Motor planning Praxis-Other Comments: very mild motor apraxia with RUE  Cognition Overall Cognitive Status: Impaired/Different from baseline Arousal/Alertness: Awake/alert Orientation  Level: Oriented X4 Year: 2024 Month: November Day of Week: Correct Attention: Sustained;Selective Sustained Attention: Appears intact Selective Attention: Appears intact Memory: Impaired Memory Impairment: Storage deficit;Retrieval deficit;Decreased recall of new information Awareness: Impaired Awareness Impairment: Intellectual impairment;Emergent impairment;Anticipatory impairment Problem Solving: Impaired Problem Solving Impairment: Functional basic Executive Function: Self Monitoring;Self Correcting Organizing: Impaired Organizing Impairment: Functional basic Self Monitoring: Impaired Self Monitoring Impairment: Functional basic Self Correcting: Impaired Self Correcting Impairment: Functional basic Safety/Judgment: Impaired Comments: impaired due to limited awareness Sensation Sensation Light Touch: Impaired by gross assessment Hot/Cold: Appears Intact Additional Comments: mild decrease in sensation moving proximal to distal in RLE compared to LLE; pt relates improvement since hospitalization Coordination Gross  Motor Movements are Fluid and Coordinated: No Fine Motor Movements are Fluid and Coordinated: No Heel Shin Test: WFL but slower to complete with RLE 9 Hole Peg Test: right: 6 sec   left: 35 sec Motor  Motor Motor: Other (comment);Motor impersistence Motor - Skilled Clinical Observations: mild R hemipareisis   Trunk/Postural Assessment  Cervical Assessment Cervical Assessment: Exceptions to [facility] Thoracic Assessment Thoracic Assessment: Exceptions to [facility] Lumbar Assessment Lumbar Assessment: Exceptions to [facility] Postural Control Postural Control: Within Functional Limits  Balance Balance Balance Assessed: Yes Static Sitting Balance Static Sitting - Balance Support: No upper extremity supported;Feet supported Static Sitting - Level of Assistance: 5: Stand by assistance Dynamic Sitting Balance Dynamic Sitting - Balance Support: During functional  activity Dynamic Sitting - Level of Assistance: 4: Min assist Sitting balance - Comments: sitting EOB and on toilet in bathroom Static Standing Balance Static Standing - Balance Support: No upper extremity supported;During functional activity Static Standing - Level of Assistance: 4: Min assist;Other (comment) Dynamic Standing Balance Dynamic Standing - Balance Support: During functional activity;No upper extremity supported Dynamic Standing - Level of Assistance: 4: Min assist Extremity Assessment  RUE Assessment General Strength Comments: 4/5 decr [facility] ; box and blocks right: 34; left 40 LUE Assessment LUE Assessment: Within Functional Limits RLE Assessment RLE Assessment: Exceptions to [facility] RLE Strength Right Hip Flexion: 3+/5 Right Hip Extension: 4-/5 Right Hip ABduction: 3/5 Right Hip ADduction: 3/5 Right Knee Flexion: 3/5 Right Knee Extension: 4-/5 Right Ankle Dorsiflexion: 4+/5 Right Ankle Plantar Flexion: 4+/5 LLE Assessment LLE Assessment: Exceptions to [facility] LLE Strength Left Hip Flexion: 4/5 Left Hip Extension: 4/5 Left Hip ABduction: 4+/5 Left Hip ADduction: 4+/5 Left Knee Flexion: 4-/5 Left Knee Extension: 4+/5 Left Ankle Dorsiflexion: 4+/5 Left Ankle Plantar Flexion: 4+/5  Care Tool Care Tool Bed Mobility Roll left and right activity   Roll left and right assist level: Supervision/Verbal cueing    Sit to lying activity   Sit to lying assist level: Contact Guard/Touching assist    Lying to sitting on side of bed activity   Lying to sitting on side of bed assist level: the ability to move from lying on the back to sitting on the side of the bed with no back support.: Contact Guard/Touching assist     Care Tool Transfers Sit to stand transfer   Sit to stand assist level: Minimal Assistance - Patient > 75%    Chair/bed transfer   Chair/bed transfer assist level: Minimal Assistance - Patient > 75%    Car transfer   Car transfer assist level: Minimal  Assistance - Patient > 75%      Care Tool Locomotion Ambulation   Assist level: Contact Guard/Touching assist Assistive device: Walker-rolling Max distance: 130 ft  Walk 10 feet activity   Assist level: Minimal Assistance - Patient > 75% Assistive device: No Device   Walk 50 feet with 2 turns activity   Assist level: Contact Guard/Touching assist Assistive device: Walker-rolling  Walk 150 feet activity   Assist level: Contact Guard/Touching assist Assistive device: Walker-rolling  Walk 10 feet on uneven surfaces activity Walk 10 feet on uneven surfaces activity did not occur: Safety/medical concerns      Stairs   Assist level: Contact Guard/Touching assist Stairs assistive device: 2 hand rails Max number of stairs: 4  Walk up/down 1 step activity     Walk up/down 1 step or curb assistive device: 2 hand rails  Walk up/down 4 steps activity   Walk up/down 4  steps assist level: Contact Guard/Touching assist Walk up/down 4 steps assistive device: 2 hand rails  Walk up/down 12 steps activity Walk up/down 12 steps activity did not occur: Safety/medical concerns      Pick up small objects from floor   Pick up small object from the floor assist level: Moderate Assistance - Patient 50 - 74%    Wheelchair Is the patient using a wheelchair?: Yes Type of Wheelchair: Manual   Wheelchair assist level: Dependent - Patient 0% Max wheelchair distance: 150 ft  Wheel 50 feet with 2 turns activity   Assist Level: Dependent - Patient 0%  Wheel 150 feet activity   Assist Level: Dependent - Patient 0%    Refer to Care Plan for Long Term Goals  SHORT TERM GOAL WEEK 1 PT Short Term Goal 1 (Week 1): STG = LTG d/t ELOS  Recommendations for other services: Therapeutic Recreation  Kitchen group and Stress management  Skilled Therapeutic Intervention Mobility Bed Mobility Bed Mobility: Supine to Sit;Sit to Supine Supine to Sit: Contact Guard/Touching assist;Minimal Assistance - Patient >  75% Sit to Supine: Contact Guard/Touching assist;Minimal Assistance - Patient > 75% Transfers Transfers: Sit to Stand;Stand to Sit;Stand Pivot Transfers Sit to Stand: Minimal Assistance - Patient > 75% Stand to Sit: Contact Guard/Touching assist Stand Pivot Transfers: Minimal Assistance - Patient > 75% Stand Pivot Transfer Details: Verbal cues for safe use of DME/AE;Verbal cues for precautions/safety;Verbal cues for technique Transfer (Assistive device): None Locomotion  Gait Ambulation: Yes Gait Assistance: Contact Guard/Touching assist Gait Distance (Feet): 125 Feet Assistive device: Rolling walker Gait Gait: Yes Gait Pattern: Impaired Gait Pattern: Decreased stance time - right;Decreased hip/knee flexion - right;Decreased hip/knee flexion - left;Decreased weight shift to right Gait velocity: decreased Stairs / Additional Locomotion Stairs: Yes Stairs Assistance: Contact Guard/Touching assist Stair Management Technique: Two rails;Alternating pattern;Step to pattern Number of Stairs: 4 Height of Stairs: 6 Wheelchair Mobility Wheelchair Mobility: No  Skilled Intervention: PT Evaluation completed; see above for results. PT educated patient in roles of PT vs OT, PT POC, rehab potential, rehab goals, and discharge recommendations along with recommendation for follow-up rehabilitation services. Individual treatment initiated:  Patient supine in bed upon PT arrival. Dtr present. Patient alert and agreeable to PT session. No pain complaint during session.  Therapeutic Activity: Bed Mobility: Patient performed supine to/from sit with CGA requiring extra time  and bed features to complete. Provided verbal cues for  technique. Transfers: Patient performed sit <> stand from EOB to no AD with CGA and heavy BLE extensor push into EOB and reaching out for support. With RW, pt pulls at [facility] for assist to stand. Educated on push-to-stand technique and improves to close supervision/ CGA during  session for balance once attaining stance. Provided vc/tc for technique throughout.  Gait Training:  Patient ambulated short in-home distances with no AD requiring up to MinA. When provided with RW, pt improves to close supervision/ CGA. And reaches 125 ft. Ambulated with light BUE pressure into RW following vc.  Pt guided in stair training with physical demonstration and verbal instructions provided prior to performance. Pt is able to complete four 6" steps using BHR with CGA to ascend with reciprocating gait pattern. To descend, pt leading with RLE while using BHR with CGA provided at anterior hips. VC provided at initiation of each direction for leading LE.    Patient supine in bed at end of session with brakes locked, bed alarm set, and all needs within reach.   Discharge Criteria: Patient  will be discharged from PT if patient refuses treatment 3 consecutive times without medical reason, if treatment goals not met, if there is a change in medical status, if patient makes no progress towards goals or if patient is discharged from hospital.  The above assessment, treatment plan, treatment alternatives and goals were discussed and mutually agreed upon: by patient and by family  Loel Dubonnet PT, DPT, CSRS 06/15/2023, 12:29 PM

## 2023-06-15 NOTE — Evaluation (Signed)
Occupational Therapy Assessment and Plan  Patient Details  Name: Sara Donaldson MRN: [id number] Date of Birth: Aug 02, 1929  OT Diagnosis: hemiplegia affecting dominant side Rehab Potential:   good  ELOS: ~ 7-9 days   Today's Date: 06/15/2023 OT Individual Time: 4034-7425 OT Individual Time Calculation (min): 70 min     Hospital Problem: Principal Problem:   Left middle cerebral artery stroke [facility])   Past Medical History:  Past Medical History:  Diagnosis Date   Aortic stenosis    Bronchitis    Heart murmur    Hypercholesterolemia    Hypertension    Thrombocytopenia (HCC) 03/05/2014   Past Surgical History:  Past Surgical History:  Procedure Laterality Date   AORTIC VALVE REPLACEMENT N/A 03/02/2014   Procedure: AORTIC VALVE REPLACEMENT (AVR);  Surgeon: Loreli Slot, MD;  Location: [facility] OR;  Service: Open Heart Surgery;  Laterality: N/A;   CARDIAC CATHETERIZATION     4/10 2015   EYE SURGERY     IR CT HEAD LTD  06/10/2023   IR PERCUTANEOUS ART THROMBECTOMY/INFUSION INTRACRANIAL INC DIAG ANGIO  06/10/2023   LEFT HEART CATHETERIZATION WITH CORONARY ANGIOGRAM N/A 10/24/2013   Procedure: LEFT HEART CATHETERIZATION WITH CORONARY ANGIOGRAM;  Surgeon: Ricki Rodriguez, MD;  Location: [facility] CATH LAB;  Service: Cardiovascular;  Laterality: N/A;   RADIOLOGY WITH ANESTHESIA N/A 06/10/2023   Procedure: IR WITH ANESTHESIA;  Surgeon: Radiologist, Medication, MD;  Location: [facility] OR;  Service: Radiology;  Laterality: N/A;   TEE WITHOUT CARDIOVERSION N/A 01/12/2014   Procedure: TRANSESOPHAGEAL ECHOCARDIOGRAM (TEE);  Surgeon: Ricki Rodriguez, MD;  Location: [facility] ENDOSCOPY;  Service: Cardiovascular;  Laterality: N/A;    Assessment & Plan Clinical Impression: Patient is a 89 y.o. year old female history significant for hypertension, obesity with BMI 27.33, tachycardia, anxiety, aortic stenosis with aortic valve repair 03/02/2014 maintained on aspirin daily.  Per chart review patient lives alone.  1 level  home one-step to entry.  Independent driving prior to admission.  She does have excellent support in the area.  Presented 06/10/2023 after being found down by her daughter with right sided weakness as well as right gaze preference with dysarthria.  Admission chemistries unremarkable except WBC 14,000, glucose 153, hemoglobin A1c 5.4.  Cranial CT scan showed hyperdense left MCA and evidence of left basal ganglia cytotoxic edema.  No acute hemorrhage identified.  CTA head and neck positive for left MCA M1 occlusion.  Generalized arterial tortuosity in the head and neck.  Bilateral carotid atherosclerosis with up to 50% stenosis on the left.  No significant posterior circulation stenosis.  Underwent thrombectomy of left M1 occlusion per interventional radiology 11/24.  MRI follow-up 06/11/2023 shows acute infarct in the left putamen, anterior greater than posterior and left caudate head.  Additional small cortical infarct in the more superior left frontal lobe.  Echocardiogram with ejection fraction of 55 to 60% no wall motion abnormalities.  Hospital course cardiology services consulted 11/26 after telemetry monitor showed atrial fibrillation with moderate ventricular response.  She was placed on Cardizem as well as low-dose beta-blocker.  Patient currently maintained on low-dose aspirin and Eliquis 2.5 mg BID. Tolerating a regular consistency diet.  Plan for lifelong Eliquis treatment.  Patient was seen by cardiology, consider one a day diltiazem 240 mg in 1 to 2 days.   Patient transferred to CIR on 06/13/2023 .    Patient currently requires min with basic self-care skills and functional mobility   secondary to muscle weakness, decreased cardiorespiratoy endurance, impaired timing and  sequencing, unbalanced muscle activation, and decreased coordination, decreased midline orientation and decreased attention to right, and decreased sitting balance, decreased standing balance, decreased postural control, and  decreased balance strategies.  Prior to hospitalization, patient could complete ADL with independent .  Patient will benefit from skilled intervention to decrease level of assist with basic self-care skills and increase independence with basic self-care skills prior to discharge home with care partner.  Anticipate patient will require intermittent supervision and follow up home health.  OT - End of Session Activity Tolerance: Tolerates 10 - 20 min activity with multiple rests Endurance Deficit: Yes OT Assessment OT Patient demonstrates impairments in the following area(s): Balance;Perception;Safety;Cognition;Endurance;Motor;Vision OT Basic ADL's Functional Problem(s): Grooming;Bathing;Dressing;Toileting OT Transfers Functional Problem(s): Toilet;Tub/Shower OT Additional Impairment(s): Fuctional Use of Upper Extremity OT Plan OT Intensity: Minimum of 1-2 x/day, 45 to 90 minutes OT Frequency: 5 out of 7 days OT Duration/Estimated Length of Stay: ~ 7-9 days OT Treatment/Interventions: Balance/vestibular training;Community reintegration;Disease mangement/prevention;Neuromuscular re-education;Patient/family education;Self Care/advanced ADL retraining;Splinting/orthotics;Therapeutic Exercise;UE/LE Coordination activities;Cognitive remediation/compensation;Discharge planning;DME/adaptive equipment instruction;Functional mobility training;Pain management;Psychosocial support;Skin care/wound managment;Therapeutic Activities;UE/LE Strength taining/ROM;Visual/perceptual remediation/compensation OT Self Feeding Anticipated Outcome(s): n/a OT Basic Self-Care Anticipated Outcome(s): mod I OT Toileting Anticipated Outcome(s): mod I OT Bathroom Transfers Anticipated Outcome(s): mod I OT Recommendation Recommendations for Other Services: Therapeutic Recreation consult Patient destination: Home Follow Up Recommendations: Outpatient OT Equipment Recommended: To be determined   OT  Evaluation Precautions/Restrictions  Precautions Precautions: Fall Precaution Comments: mild R inattention and hemipareisis Restrictions Weight Bearing Restrictions: No General Chart Reviewed: Yes Family/Caregiver Present: No Vital Signs Therapy Vitals Temp: 98.3 F (36.8 C) Temp Source: Oral Pulse Rate: 68 Resp: 19 BP: 138/76 Patient Position (if appropriate): Sitting Oxygen Therapy SpO2: 98 % O2 Device: Room Air Pain  No c/o pain  Home Living/Prior Functioning Home Living Family/patient expects to be discharged to:: Private residence Living Arrangements: Alone Available Help at Discharge: Family, Available 24 hours/day Type of Home: House Home Access: Stairs to enter Secretary/administrator of Steps: 1 Entrance Stairs-Rails: None Home Layout: One level Bathroom Shower/Tub: Health visitor: Pharmacist, community: Yes  Lives With: Alone IADL History Education: high school Prior Function Level of Independence: Independent with basic ADLs, Independent with homemaking with ambulation, Independent with gait, Independent with transfers (does light cooking - mostly with microwave)  Able to Take Stairs?: Yes Driving: Yes Vocation: Retired Administrator, sports Baseline Vision/History: 1 Wears glasses Ability to See in Adequate Light: 0 Adequate Patient Visual Report: No change from baseline Eye Alignment: Within Functional Limits Ocular Range of Motion: Within Functional Limits Tracking/Visual Pursuits: Decreased smoothness of horizontal tracking Saccades: Decreased speed of saccadic movement Convergence: Within functional limits Perception  Perception: Impaired Perception-Other Comments: mild right inattention Praxis Praxis: Impaired Praxis Impairment Details: Motor planning Praxis-Other Comments: very mild motor apraxia with RUE Cognition Cognition Overall Cognitive Status: Impaired/Different from baseline Arousal/Alertness: Awake/alert Orientation  Level: Person;Place;Situation Person: Oriented Place: Oriented Situation: Oriented Memory: Impaired Memory Impairment: Storage deficit;Retrieval deficit;Decreased recall of new information Attention: Sustained;Selective Sustained Attention: Appears intact Selective Attention: Appears intact Awareness: Impaired Awareness Impairment: Intellectual impairment;Emergent impairment;Anticipatory impairment Problem Solving: Impaired Problem Solving Impairment: Functional basic Executive Function: Self Monitoring;Self Correcting Organizing: Impaired Organizing Impairment: Functional basic Self Monitoring: Impaired Self Monitoring Impairment: Functional basic Self Correcting: Impaired Self Correcting Impairment: Functional basic Safety/Judgment: Impaired Comments: impaired due to limited awareness Brief Interview for Mental Status (BIMS) Repetition of Three Words (First Attempt): 3 Temporal Orientation: Year: Correct Temporal Orientation: Month: Accurate within 5 days Temporal Orientation: Day: Correct  Recall: "Sock": Yes, no cue required Recall: "Blue": Yes, no cue required Recall: "Bed": No, could not recall BIMS Summary Score: 13 Sensation Sensation Light Touch: Impaired by gross assessment Hot/Cold: Appears Intact Additional Comments: mild decrease in sensation moving proximal to distal in RLE compared to LLE; pt relates improvement since hospitalization Coordination Gross Motor Movements are Fluid and Coordinated: No Fine Motor Movements are Fluid and Coordinated: No 9 Hole Peg Test: right: 6 sec   left: 35 sec Motor  Motor Motor: Other (comment);Motor impersistence  Trunk/Postural Assessment  Cervical Assessment Cervical Assessment: Exceptions to [facility] Thoracic Assessment Thoracic Assessment: Exceptions to [facility] Lumbar Assessment Lumbar Assessment: Exceptions to [facility] Postural Control Postural Control: Within Functional Limits  Balance Balance Balance Assessed:  Yes Static Sitting Balance Static Sitting - Balance Support: No upper extremity supported;Feet supported Static Sitting - Level of Assistance: 5: Stand by assistance Dynamic Sitting Balance Dynamic Sitting - Balance Support: During functional activity Dynamic Sitting - Level of Assistance: 4: Min assist Sitting balance - Comments: sitting EOB and on toilet in bathroom Static Standing Balance Static Standing - Balance Support: No upper extremity supported;During functional activity Static Standing - Level of Assistance: 4: Min assist;Other (comment) Dynamic Standing Balance Dynamic Standing - Balance Support: During functional activity;No upper extremity supported Dynamic Standing - Level of Assistance: 4: Min assist Extremity/Trunk Assessment RUE Assessment General Strength Comments: 4/5 decr [facility] ; box and blocks right: 34; left 40 LUE Assessment LUE Assessment: Within Functional Limits  Care Tool Care Tool Self Care Eating   Eating Assist Level: Set up assist    Oral Care    Oral Care Assist Level: Set up assist    Bathing   Body parts bathed by patient: Right arm;Left arm;Chest;Abdomen;Front perineal area;Buttocks;Right upper leg;Left upper leg;Right lower leg;Left lower leg;Face     Assist Level: Contact Guard/Touching assist    Upper Body Dressing(including orthotics)   What is the patient wearing?: Pull over shirt   Assist Level: Contact Guard/Touching assist    Lower Body Dressing (excluding footwear)   What is the patient wearing?: Underwear/pull up;Pants Assist for lower body dressing: Minimal Assistance - Patient > 75%    Putting on/Taking off footwear   What is the patient wearing?: Socks;Shoes Assist for footwear: Minimal Assistance - Patient > 75%       Care Tool Toileting Toileting activity   Assist for toileting: Contact Guard/Touching assist     Care Tool Bed Mobility Roll left and right activity   Roll left and right assist level:  Supervision/Verbal cueing    Sit to lying activity   Sit to lying assist level: Contact Guard/Touching assist    Lying to sitting on side of bed activity   Lying to sitting on side of bed assist level: the ability to move from lying on the back to sitting on the side of the bed with no back support.: Contact Guard/Touching assist     Care Tool Transfers Sit to stand transfer   Sit to stand assist level: Minimal Assistance - Patient > 75%    Chair/bed transfer   Chair/bed transfer assist level: Minimal Assistance - Patient > 75%     Toilet transfer   Assist Level: Minimal Assistance - Patient > 75%     Care Tool Cognition  Expression of Ideas and Wants Expression of Ideas and Wants: 4. Without difficulty (complex and basic) - expresses complex messages without difficulty and with speech that is clear and easy to understand  Understanding  Verbal and Non-Verbal Content Understanding Verbal and Non-Verbal Content: 4. Understands (complex and basic) - clear comprehension without cues or repetitions   Memory/Recall Ability Memory/Recall Ability : Current season;That he or she is in a hospital/hospital unit   Refer to Care Plan for Long Term Goals  SHORT TERM GOAL WEEK 1 OT Short Term Goal 1 (Week 1): STG=LTG  Recommendations for other services: Therapeutic Recreation  Outing/community reintegration   Skilled Therapeutic Intervention 1:1 Ot eval initiated with OT purpose, role and goals discussed.  Self care retraining at shower level. Functional ambulation without device with min A to the bathroom. In standing focus on doffing clothing with min Guard. Pt transitioned into the shower (which was on) with her shirt on requiring max A to stop and doff shirt before getting it wet. In dynamic sitting pt leaning and falling to the right requiring min frequent cues. Pt returned to EOB and dressed sit to stand with items placed on the right with extra time to locate items. Grooming/ brushing  teeth in standing. Performed box and blocks and 9 hole peg test - see results above.   Pt left sitting in the recliner in prep for lunch with seat belt and call bell.   ADL ADL Eating: Set up Grooming: Setup Upper Body Bathing: Minimal assistance Lower Body Bathing: Minimal assistance Upper Body Dressing: Minimal assistance Lower Body Dressing: Minimal assistance Toileting: Minimal assistance Toilet Transfer: Minimal assistance Mobility  Bed Mobility Supine to Sit: Contact Guard/Touching assist;Minimal Assistance - Patient > 75% Sit to Supine: Contact Guard/Touching assist;Minimal Assistance - Patient > 75% Transfers Sit to Stand: Minimal Assistance - Patient > 75% Stand to Sit: Contact Guard/Touching assist   Discharge Criteria: Patient will be discharged from OT if patient refuses treatment 3 consecutive times without medical reason, if treatment goals not met, if there is a change in medical status, if patient makes no progress towards goals or if patient is discharged from hospital.  The above assessment, treatment plan, treatment alternatives and goals were discussed and mutually agreed upon: by patient  Adan Sis 06/15/2023, 3:07 PM

## 2023-06-15 NOTE — Evaluation (Signed)
Speech Language Pathology Assessment and Plan  Patient Details  Name: Sara Donaldson MRN: [id number] Date of Birth: 09-15-1929  SLP Diagnosis: Dysarthria;Cognitive Impairments;Aphasia  Rehab Potential: Good ELOS: 7-9 days   Today's Date: 06/15/2023 SLP Individual Time: 0902-1000 SLP Individual Time Calculation (min): 58 min  Hospital Problem: Principal Problem:   Left middle cerebral artery stroke [facility])  Past Medical History:  Past Medical History:  Diagnosis Date   Aortic stenosis    Bronchitis    Heart murmur    Hypercholesterolemia    Hypertension    Thrombocytopenia (HCC) 03/05/2014   Past Surgical History:  Past Surgical History:  Procedure Laterality Date   AORTIC VALVE REPLACEMENT N/A 03/02/2014   Procedure: AORTIC VALVE REPLACEMENT (AVR);  Surgeon: Loreli Slot, MD;  Location: [facility] OR;  Service: Open Heart Surgery;  Laterality: N/A;   CARDIAC CATHETERIZATION     4/10 2015   EYE SURGERY     IR CT HEAD LTD  06/10/2023   IR PERCUTANEOUS ART THROMBECTOMY/INFUSION INTRACRANIAL INC DIAG ANGIO  06/10/2023   LEFT HEART CATHETERIZATION WITH CORONARY ANGIOGRAM N/A 10/24/2013   Procedure: LEFT HEART CATHETERIZATION WITH CORONARY ANGIOGRAM;  Surgeon: Ricki Rodriguez, MD;  Location: [facility] CATH LAB;  Service: Cardiovascular;  Laterality: N/A;   RADIOLOGY WITH ANESTHESIA N/A 06/10/2023   Procedure: IR WITH ANESTHESIA;  Surgeon: Radiologist, Medication, MD;  Location: [facility] OR;  Service: Radiology;  Laterality: N/A;   TEE WITHOUT CARDIOVERSION N/A 01/12/2014   Procedure: TRANSESOPHAGEAL ECHOCARDIOGRAM (TEE);  Surgeon: Ricki Rodriguez, MD;  Location: [facility] ENDOSCOPY;  Service: Cardiovascular;  Laterality: N/A;   Assessment / Plan / Recommendation Clinical Impression 89 year old right-handed female with history significant for hypertension, obesity with BMI 27.33, tachycardia, anxiety, aortic stenosis with aortic valve repair 03/02/2014 maintained on aspirin daily.  Presented 06/10/2023 after  being found down by her daughter with right sided weakness as well as right gaze preference with dysarthria.    Admission chemistries unremarkable except WBC 14,000, glucose 153, hemoglobin A1c 5.4.  Cranial CT scan showed hyperdense left MCA and evidence of left basal ganglia cytotoxic edema.  No acute hemorrhage identified.  CTA head and neck positive for left MCA M1 occlusion.  Generalized arterial tortuosity in the head and neck.  Bilateral carotid atherosclerosis with up to 50% stenosis on the left.  No significant posterior circulation stenosis.  Underwent thrombectomy of left M1 occlusion per interventional radiology 11/24.  MRI follow-up 06/11/2023 shows acute infarct in the left putamen, anterior greater than posterior and left caudate head.  Additional small cortical infarct in the more superior left frontal lobe.  Echocardiogram with ejection fraction of 55 to 60% no wall motion abnormalities.  Hospital course cardiology services consulted 11/26 after telemetry monitor showed atrial fibrillation with moderate ventricular response.  She was placed on Cardizem as well as low-dose beta-blocker.  Patient currently maintained on low-dose aspirin and Eliquis awaiting plan by cardiology services.  Tolerating a regular consistency diet.   Swallowing: Patient reports no concerns and exhibits no overt s/sx of penetration/aspiration nor difficulty across all trials of regular solids, purees, or thin liquids. Oral mechanism exam was largely unremarkable. Recommend continuation of regular/thin liquid diet. No SLP needs indicated for swallowing during CIR admission.   Cognition: Per SLUMS examination, patient exhibits deficits in the areas of short term memory, working memory, problem solving, and generative naming resulting in overall score of 15/30. Patient exhibits reductions in intellectual, anticipatory, and emergent awareness as evidenced during several tasks throughout session. Patient is  oriented x4.  Patient would benefit from skilled therapy sessions targeting aforementioned cognitive deficits.   Speech/Language: Patient presents with mild dysarthria (though 100% intelligibility at the sentence level) characterized by reduced breath support, impaired articulation, and changes to voice quality. 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 During these instances, patient benefited from phonemic cues to repair breakdowns. Patient would benefit from skilled therapy services to target speech and language deficits.   Patient was left in her bed with bed alarm set and call bell within reach.    Skilled Therapeutic Interventions          SLP conducted skilled evaluation session to assess swallowing, cognition, speech, and language via SLUMS, patient interview, and bedside swallow evaluation. Full results above.    SLP Assessment  Patient will need skilled Speech Lanaguage Pathology Services during CIR admission    Recommendations  SLP Diet Recommendations: Age appropriate regular solids;Thin Liquid Administration via: Spoon;Cup;Straw Medication Administration: Whole meds with liquid Supervision: Patient able to self feed Oral Care Recommendations: Oral care BID Patient destination: Home Follow up Recommendations: Home Health SLP Equipment Recommended: None recommended by SLP    SLP Frequency 3 to 5 out of 7 days   SLP Duration  SLP Intensity  SLP Treatment/Interventions 7-9 days  Minumum of 1-2 x/day, 30 to 90 minutes  Cognitive remediation/compensation;Environmental controls;Speech/Language facilitation;Functional tasks;Cueing hierarchy;Therapeutic Activities;Internal/external aids;Therapeutic Exercise;Patient/family education    Pain Pain Assessment Pain Scale: 0-10 Pain Score: 0-No pain  Prior Functioning Cognitive/Linguistic Baseline: Within  functional limits Type of Home: House  Lives With: Alone Available Help at Discharge: Family;Available 24 hours/day Education: high school Vocation: Retired  Architectural technologist Overall Cognitive Status: Impaired/Different from baseline Arousal/Alertness: Awake/alert Orientation Level: Oriented X4 Year: 2024 Month: November Day of Week: Correct Attention: Sustained;Selective Sustained Attention: Appears intact Selective Attention: Appears intact Memory: Impaired Memory Impairment: Storage deficit;Retrieval deficit;Decreased recall of new information Awareness: Impaired Awareness Impairment: Intellectual impairment;Emergent impairment;Anticipatory impairment Problem Solving: Impaired Problem Solving Impairment: Functional basic Executive Function: Self Monitoring;Self Correcting Organizing: Impaired Organizing Impairment: Functional basic Self Monitoring: Impaired Self Monitoring Impairment: Functional basic Self Correcting: Impaired Self Correcting Impairment: Functional basic Safety/Judgment: Impaired Comments: impaired due to limited awareness  Comprehension Auditory Comprehension Overall Auditory Comprehension: Appears within functional limits for tasks assessed Expression Expression Primary Mode of Expression: Verbal Verbal Expression Overall Verbal Expression: Impaired Initiation: No impairment Automatic Speech: Day of week;Month of year Level of Generative/Spontaneous Verbalization: Sentence Repetition: No impairment Naming: Impairment Responsive: 76-100% accurate Confrontation: Not tested Convergent: 75-100% accurate Divergent: 50-74% accurate Other Naming Comments: generative naming deficits Pragmatics: No impairment Effective Techniques: Open ended questions;Articulatory cues Non-Verbal Means of Communication: Not applicable Written Expression Dominant Hand: Right Written Expression: Not tested Oral Motor Oral Motor/Sensory Function Overall  Oral Motor/Sensory Function: Within functional limits Motor Speech Overall Motor Speech: Impaired Respiration: Impaired Level of Impairment: Sentence Phonation: Low vocal intensity (mildly strained) Resonance: Within functional limits Articulation: Impaired Level of Impairment: Sentence Intelligibility: Intelligibility reduced Word: 75-100% accurate Phrase: 75-100% accurate Sentence: 75-100% accurate Motor Planning: Witnin functional limits Motor Speech Errors: Consistent Interfering Components: Inadequate dentition Effective Techniques: Slow rate;Increased vocal intensity;Over-articulate;Pause  Care Tool Care Tool Cognition Ability to hear (with hearing aid or hearing appliances if normally used Ability to hear (with hearing aid or hearing appliances if normally used): 0. Adequate - no difficulty in normal conservation, social interaction, listening to TV  Expression of Ideas and Wants Expression of Ideas and Wants: 4. Without difficulty (complex and basic) - expresses complex messages without difficulty and with speech that is clear and easy to understand   Understanding Verbal and Non-Verbal Content Understanding Verbal and Non-Verbal Content: 4. Understands (complex and basic) - clear comprehension without cues or repetitions  Memory/Recall Ability Memory/Recall Ability : Current season;That he or she is in a hospital/hospital unit   Intelligibility: Intelligibility reduced Word: 75-100% accurate Phrase: 75-100% accurate Sentence: 75-100% accurate  Bedside Swallowing Assessment General Date of Onset: 06/15/23 Previous Swallow Assessment: n/a Diet Prior to this Study: Regular;Thin liquids (Level 0) Temperature Spikes Noted: No Respiratory Status: Room air History of Recent Intubation: No Behavior/Cognition: Alert;Cooperative;Pleasant mood Oral Cavity - Dentition: Poor condition;Missing dentition (does not wear dentures to eat) Self-Feeding Abilities: Able to feed  self Patient Positioning: Upright in bed Baseline Vocal Quality: Low vocal intensity Volitional Cough: Weak Volitional Swallow: Able to elicit  Oral Care Assessment Oral Assessment  (WDL): Exceptions to WDL Lips: Symmetrical Teeth: Missing (Comment) Tongue: Pink;Moist Mucous Membrane(s): Pink;Moist Saliva: Moist, saliva free flowing Level of Consciousness: Alert Is patient on any of following O2 devices?: None of the above Nutritional status: No high risk factors Oral Assessment Risk : Low Risk Ice Chips Ice chips: Not tested Thin Liquid Thin Liquid: Within functional limits Presentation: Cup;Straw;Self Fed Nectar Thick Nectar Thick Liquid: Not tested Honey Thick Honey Thick Liquid: Not tested Puree Puree: Within functional limits Presentation: Self Fed;Spoon Solid Solid: Within functional limits Presentation: Self Fed BSE Assessment Risk for Aspiration Impact on safety and function: No limitations  Short Term Goals: Week 1: SLP Short Term Goal 1 (Week 1): STGs=LTGs d/t ELOS  Refer to Care Plan for Long Term Goals  Recommendations for other services: None   Discharge Criteria: Patient will be discharged from SLP if patient refuses treatment 3 consecutive times without medical reason, if treatment goals not met, if there is a change in medical status, if patient makes no progress towards goals or if patient is discharged from hospital.  The above assessment, treatment plan, treatment alternatives and goals were discussed and mutually agreed upon: by patient  Jeannie Done, M.A., CCC-SLP  Yetta Barre 06/15/2023, 12:17 PM

## 2023-06-15 NOTE — Plan of Care (Signed)
Problem: RH Balance Goal: LTG: Patient will maintain dynamic sitting balance (OT) Description: LTG:  Patient will maintain dynamic sitting balance with assistance during activities of daily living (OT) Flowsheets (Taken 06/15/2023 1158) LTG: Pt will maintain dynamic sitting balance during ADLs with: Independent with assistive device Goal: LTG Patient will maintain dynamic standing with ADLs (OT) Description: LTG:  Patient will maintain dynamic standing balance with assist during activities of daily living (OT)  Flowsheets (Taken 06/15/2023 1158) LTG: Pt will maintain dynamic standing balance during ADLs with: Independent with assistive device   Problem: Sit to Stand Goal: LTG:  Patient will perform sit to stand in prep for activites of daily living with assistance level (OT) Description: LTG:  Patient will perform sit to stand in prep for activites of daily living with assistance level (OT) Flowsheets (Taken 06/15/2023 1158) LTG: PT will perform sit to stand in prep for activites of daily living with assistance level: Independent with assistive device   Problem: RH Grooming Goal: LTG Patient will perform grooming w/assist,cues/equip (OT) Description: LTG: Patient will perform grooming with assist, with/without cues using equipment (OT) Flowsheets (Taken 06/15/2023 1158) LTG: Pt will perform grooming with assistance level of: Independent   Problem: RH Bathing Goal: LTG Patient will bathe all body parts with assist levels (OT) Description: LTG: Patient will bathe all body parts with assist levels (OT) Flowsheets (Taken 06/15/2023 1158) LTG: Pt will perform bathing with assistance level/cueing: Supervision/Verbal cueing   Problem: RH Dressing Goal: LTG Patient will perform upper body dressing (OT) Description: LTG Patient will perform upper body dressing with assist, with/without cues (OT). Flowsheets (Taken 06/15/2023 1158) LTG: Pt will perform upper body dressing with assistance  level of: Independent Goal: LTG Patient will perform lower body dressing w/assist (OT) Description: LTG: Patient will perform lower body dressing with assist, with/without cues in positioning using equipment (OT) Flowsheets (Taken 06/15/2023 1158) LTG: Pt will perform lower body dressing with assistance level of: Independent with assistive device   Problem: RH Toileting Goal: LTG Patient will perform toileting task (3/3 steps) with assistance level (OT) Description: LTG: Patient will perform toileting task (3/3 steps) with assistance level (OT)  Flowsheets (Taken 06/15/2023 1158) LTG: Pt will perform toileting task (3/3 steps) with assistance level: Independent with assistive device   Problem: RH Functional Use of Upper Extremity Goal: LTG Patient will use RT/LT upper extremity as a (OT) Description: LTG: Patient will use right/left upper extremity as a stabilizer/gross assist/diminished/nondominant/dominant level with assist, with/without cues during functional activity (OT) Flowsheets (Taken 06/15/2023 1158) LTG: Use of upper extremity in functional activities: RUE as dominant level LTG: Pt will use upper extremity in functional activity with assistance level of: Independent with assistive device   Problem: RH Toilet Transfers Goal: LTG Patient will perform toilet transfers w/assist (OT) Description: LTG: Patient will perform toilet transfers with assist, with/without cues using equipment (OT) Flowsheets (Taken 06/15/2023 1158) LTG: Pt will perform toilet transfers with assistance level of: Independent with assistive device   Problem: RH Tub/Shower Transfers Goal: LTG Patient will perform tub/shower transfers w/assist (OT) Description: LTG: Patient will perform tub/shower transfers with assist, with/without cues using equipment (OT) Flowsheets (Taken 06/15/2023 1158) LTG: Pt will perform tub/shower stall transfers with assistance level of: Supervision/Verbal cueing   Problem: RH  Awareness Goal: LTG: Patient will demonstrate awareness during functional activites type of (OT) Description: LTG: Patient will demonstrate awareness during functional activites type of (OT) Flowsheets (Taken 06/15/2023 1158) LTG: Patient will demonstrate awareness during functional activites type of (OT):  Modified Independent

## 2023-06-15 NOTE — Plan of Care (Signed)
  Problem: RH Balance Goal: LTG Patient will maintain dynamic standing balance (PT) Description: LTG:  Patient will maintain dynamic standing balance with assistance during mobility activities (PT) Flowsheets (Taken 06/15/2023 1643) LTG: Pt will maintain dynamic standing balance during mobility activities with:: Independent with assistive device    Problem: Sit to Stand Goal: LTG:  Patient will perform sit to stand with assistance level (PT) Description: LTG:  Patient will perform sit to stand with assistance level (PT) Flowsheets (Taken 06/15/2023 1643) LTG: PT will perform sit to stand in preparation for functional mobility with assistance level: Independent with assistive device   Problem: RH Bed Mobility Goal: LTG Patient will perform bed mobility with assist (PT) Description: LTG: Patient will perform bed mobility with assistance, with/without cues (PT). Flowsheets (Taken 06/15/2023 1643) LTG: Pt will perform bed mobility with assistance level of: Independent with assistive device    Problem: RH Bed to Chair Transfers Goal: LTG Patient will perform bed/chair transfers w/assist (PT) Description: LTG: Patient will perform bed to chair transfers with assistance (PT). Flowsheets (Taken 06/15/2023 1643) LTG: Pt will perform Bed to Chair Transfers with assistance level: Independent with assistive device    Problem: RH Car Transfers Goal: LTG Patient will perform car transfers with assist (PT) Description: LTG: Patient will perform car transfers with assistance (PT). Flowsheets (Taken 06/15/2023 1643) LTG: Pt will perform car transfers with assist:: Supervision/Verbal cueing   Problem: RH Furniture Transfers Goal: LTG Patient will perform furniture transfers w/assist (OT/PT) Description: LTG: Patient will perform furniture transfers  with assistance (OT/PT). Flowsheets (Taken 06/15/2023 1643) LTG: Pt will perform furniture transfers with assist:: Independent with assistive device     Problem: RH Ambulation Goal: LTG Patient will ambulate in home environment (PT) Description: LTG: Patient will ambulate in home environment, # of feet with assistance (PT). Flowsheets (Taken 06/15/2023 1643) LTG: Pt will ambulate in home environ  assist needed:: Supervision/Verbal cueing LTG: Ambulation distance in home environment: up to 50 ft bouts using LRAD and maneuvering all corners and obstacles Goal: LTG Patient will ambulate in community environment (PT) Description: LTG: Patient will ambulate in community environment, # of feet with assistance (PT). Flowsheets (Taken 06/15/2023 1643) LTG: Pt will ambulate in community environ  assist needed:: Supervision/Verbal cueing LTG: Ambulation distance in community environment: at least 300 ft using LRAD   Problem: RH Stairs Goal: LTG Patient will ambulate up and down stairs w/assist (PT) Description: LTG: Patient will ambulate up and down # of stairs with assistance (PT) Flowsheets (Taken 06/15/2023 1643) LTG: Pt will ambulate up/down stairs assist needed:: Supervision/Verbal cueing LTG: Pt will  ambulate up and down number of stairs: at least 2 steps using HR setup as per home environment

## 2023-06-15 NOTE — IPOC Note (Signed)
Overall Plan of Care [facility]) Patient Details Name: Sara Donaldson MRN: [id number] DOB: Sep 11, 1929  Admitting Diagnosis: Left middle cerebral artery stroke [facility])  Hospital Problems: Principal Problem:   Left middle cerebral artery stroke (HCC)     Functional Problem List: Nursing Endurance, Medication Management, Safety, Sensory, Skin Integrity  PT Balance, Endurance, Motor, Nutrition, Perception, Safety, Sensory  OT Balance, Perception, Safety, Cognition, Endurance, Motor, Vision  SLP Cognition, Linguistic, Motor  TR         Basic ADL's: OT Grooming, Bathing, Dressing, Toileting     Advanced  ADL's: OT       Transfers: PT Bed Mobility, Bed to Chair, Car, Occupational psychologist, Research scientist (life sciences): PT Ambulation, Stairs     Additional Impairments: OT Fuctional Use of Upper Extremity  SLP Social Cognition   Problem Solving, Memory, Attention, Awareness  TR      Anticipated Outcomes Item Anticipated Outcome  Self Feeding n/a  Swallowing      Basic self-care  mod I  Toileting  mod I   Bathroom Transfers mod I  Bowel/Bladder  continent of bowel/bladder  Transfers  Mod I  Locomotion  supervision/ Mod I  Communication  min assist  Cognition  min assist  Pain  less than 4  Safety/Judgment  with cues   Therapy Plan: PT Intensity: Minimum of 1-2 x/day ,45 to 90 minutes PT Frequency: 5 out of 7 days PT Duration Estimated Length of Stay: 7-9 days OT Intensity: Minimum of 1-2 x/day, 45 to 90 minutes OT Frequency: 5 out of 7 days OT Duration/Estimated Length of Stay: ~ 7-9 days SLP Intensity: Minumum of 1-2 x/day, 30 to 90 minutes SLP Frequency: 3 to 5 out of 7 days SLP Duration/Estimated Length of Stay: 7-9 days   Team Interventions: Nursing Interventions Patient/Family Education, Disease Management/Prevention, Medication Management, Skin Care/Wound Management, Discharge Planning  PT interventions Ambulation/gait training, Community reintegration,  DME/adaptive equipment instruction, Neuromuscular re-education, Psychosocial support, Stair training, UE/LE Strength taining/ROM, Warden/ranger, Discharge planning, Therapeutic Activities, UE/LE Coordination activities, Cognitive remediation/compensation, Disease management/prevention, Functional mobility training, Patient/family education, Splinting/orthotics, Therapeutic Exercise  OT Interventions Warden/ranger, Community reintegration, Disease mangement/prevention, Neuromuscular re-education, Patient/family education, Self Care/advanced ADL retraining, Splinting/orthotics, Therapeutic Exercise, UE/LE Coordination activities, Cognitive remediation/compensation, Discharge planning, DME/adaptive equipment instruction, Functional mobility training, Pain management, Psychosocial support, Skin care/wound managment, Therapeutic Activities, UE/LE Strength taining/ROM, Visual/perceptual remediation/compensation  SLP Interventions Cognitive remediation/compensation, Environmental controls, Speech/Language facilitation, Functional tasks, Cueing hierarchy, Therapeutic Activities, Internal/external aids, Therapeutic Exercise, Patient/family education  TR Interventions    SW/CM Interventions Discharge Planning, Psychosocial Support, Patient/Family Education, Disease Management/Prevention   Barriers to Discharge MD  Medical stability  Nursing Decreased caregiver support, Home environment access/layout, Wound Care, Lack of/limited family support home alone to 1 level with 1 ste entry, no rail  PT Inaccessible home environment, Decreased caregiver support, Home environment Best boy, Nutrition means    OT      SLP      SW       Team Discharge Planning: Destination: PT-Home ,OT- Home , SLP-Home Projected Follow-up: PT-Outpatient PT, 24 hour supervision/assistance, OT-  Outpatient OT, SLP-Home Health SLP Projected Equipment Needs: PT-To be determined, OT- To be determined,  SLP-None recommended by SLP Equipment Details: PT- , OT-  Patient/family involved in discharge planning: PT- Patient, Family member/caregiver,  OT-Patient, SLP-Patient  MD ELOS: 7-9 days Medical Rehab Prognosis:  Excellent Assessment: The patient has been admitted for CIR therapies with the diagnosis of left MCA infarct.  The team will be addressing functional mobility, strength, stamina, balance, safety, adaptive techniques and equipment, self-care, bowel and bladder mgt, patient and caregiver education, communication/language, cognition, community reentry. Goals have been set at mod I with mobility and self-care and min assist with communication and cognition. Anticipated discharge destination is home with family.        See Team Conference Notes for weekly updates to the plan of care

## 2023-06-15 NOTE — Consult Note (Signed)
Ref: Orpah Cobb, MD   Subjective:  Awake. Sitting up. Able to use right hand. HR in 60-70 range at rest.  Objective:  Vital Signs in the last 24 hours: Temp:  [97.9 F (36.6 C)-98.4 F (36.9 C)] 98.3 F (36.8 C) (11/29 1256) Pulse Rate:  [68-90] 68 (11/29 1256) Resp:  [17-19] 19 (11/29 1256) BP: (132-172)/(73-95) 138/76 (11/29 1256) SpO2:  [94 %-98 %] 98 % (11/29 1256)  Physical Exam: BP Readings from Last 1 Encounters:  06/15/23 138/76     Wt Readings from Last 1 Encounters:  06/13/23 64.1 kg    Weight change:  Body mass index is 26.7 kg/m. HEENT: [place]/AT, Eyes-Blue, Conjunctiva-Pink, Sclera-Non-icteric Neck: No JVD, No bruit, Trachea midline. Lungs:  Clear, Bilateral. Cardiac:  Irregular rhythm, normal S1 and S2, no S3. II/VI systolic murmur. Abdomen:  Soft, non-tender. BS present. Extremities:  No edema present. No cyanosis. No clubbing. CNS: AxOx3, Cranial nerves grossly intact, moves all 4 extremities. Right sided weakness and thick speech and vision disturbance continues. Skin: Warm and dry.   Intake/Output from previous day: 11/28 0701 - 11/29 0700 In: 540 [P.O.:540] Out: -     Lab Results: BMET    Component Value Date/Time   NA 138 06/14/2023 0417   NA 138 06/13/2023 0519   NA 138 06/12/2023 0426   K 3.9 06/14/2023 0417   K 3.9 06/13/2023 0519   K 3.8 06/12/2023 0426   CL 108 06/14/2023 0417   CL 104 06/13/2023 0519   CL 105 06/12/2023 0426   CO2 25 06/14/2023 0417   CO2 24 06/13/2023 0519   CO2 24 06/12/2023 0426   GLUCOSE 102 (H) 06/14/2023 0417   GLUCOSE 112 (H) 06/13/2023 0519   GLUCOSE 104 (H) 06/12/2023 0426   BUN 6 (L) 06/14/2023 0417   BUN 9 06/13/2023 0519   BUN 14 06/12/2023 0426   CREATININE 0.49 06/14/2023 0417   CREATININE 0.61 06/13/2023 0519   CREATININE 0.65 06/12/2023 0426   CALCIUM 8.8 (L) 06/14/2023 0417   CALCIUM 9.0 06/13/2023 0519   CALCIUM 9.0 06/12/2023 0426   GFRNONAA >60 06/14/2023 0417   GFRNONAA >60  06/13/2023 0519   GFRNONAA >60 06/12/2023 0426   GFRAA >60 06/08/2018 1632   GFRAA >60 05/27/2018 0925   GFRAA >90 03/07/2014 0410   CBC    Component Value Date/Time   WBC 6.3 06/14/2023 0556   RBC 4.85 06/14/2023 0556   HGB 13.9 06/14/2023 0556   HCT 43.7 06/14/2023 0556   PLT 156 06/14/2023 0556   MCV 90.1 06/14/2023 0556   MCH 28.7 06/14/2023 0556   MCHC 31.8 06/14/2023 0556   RDW 13.2 06/14/2023 0556   LYMPHSABS 1.3 06/14/2023 0556   MONOABS 0.7 06/14/2023 0556   EOSABS 0.2 06/14/2023 0556   BASOSABS 0.0 06/14/2023 0556   HEPATIC Function Panel Recent Labs    06/10/23 1219 06/14/23 0417  PROT 7.5 6.4*  ALBUMIN 4.1 3.3*  AST 31 30  ALT 19 18  ALKPHOS 67 53   HEMOGLOBIN A1C Lab Results  Component Value Date   MPG 108.28 06/10/2023   CARDIAC ENZYMES No results found for: "CKTOTAL", "CKMB", "CKMBINDEX", "TROPONINI" BNP No results for input(s): "PROBNP" in the last 8760 hours. TSH No results for input(s): "TSH" in the last 8760 hours. CHOLESTEROL Recent Labs    06/11/23 0538  CHOL 117    Scheduled Meds:  apixaban  2.5 mg Oral BID   aspirin EC  81 mg Oral Daily   diltiazem  240 mg Oral Daily   metoprolol tartrate  50 mg Oral BID   pravastatin  40 mg Oral q1800   Ensure Max Protein  11 oz Oral Daily   Continuous Infusions: PRN Meds:.acetaminophen **OR** acetaminophen (TYLENOL) oral liquid 160 mg/5 mL **OR** acetaminophen, ALPRAZolam  Assessment/Plan: Acute left MCA stroke, embolic Atrial fibrillation, [id number] score of 6 HTN HLD Obesity S/P AV replacement, 21 mm Edwards, Pericardial  Plan: Continue medical treatment. F/U in 1-2 weeks post diascharge.   LOS: 2 days   Time spent including chart review, lab review, examination, discussion with patient/Nurse : 30 min   Orpah Cobb  MD  06/15/2023, 12:59 PM

## 2023-06-16 DIAGNOSIS — I10 Essential (primary) hypertension: Secondary | ICD-10-CM | POA: Diagnosis not present

## 2023-06-16 DIAGNOSIS — I4891 Unspecified atrial fibrillation: Secondary | ICD-10-CM | POA: Diagnosis not present

## 2023-06-16 DIAGNOSIS — E785 Hyperlipidemia, unspecified: Secondary | ICD-10-CM | POA: Diagnosis not present

## 2023-06-16 DIAGNOSIS — I63512 Cerebral infarction due to unspecified occlusion or stenosis of left middle cerebral artery: Secondary | ICD-10-CM | POA: Diagnosis not present

## 2023-06-16 DIAGNOSIS — G4700 Insomnia, unspecified: Secondary | ICD-10-CM

## 2023-06-16 MED ORDER — MELATONIN 3 MG PO TABS
3.0000 mg | ORAL_TABLET | Freq: Every evening | ORAL | Status: DC | PRN
  Administered 2023-06-16 – 2023-06-17 (×2): 3 mg via ORAL
  Filled 2023-06-16 (×2): qty 1

## 2023-06-16 NOTE — Progress Notes (Signed)
PROGRESS NOTE   Subjective/Complaints: Sitting in bedside chair. Reports poor sleep last night.   ROS: Patient denies fever, rash, sore throat,  dizziness, nausea, vomiting, diarrhea, cough, shortness of breath or chest pain, joint or back/neck pain, headache, or mood change.  + insomnia   Objective:   No results found. Recent Labs    06/14/23 0556  WBC 6.3  HGB 13.9  HCT 43.7  PLT 156   Recent Labs    06/14/23 0417  NA 138  K 3.9  CL 108  CO2 25  GLUCOSE 102*  BUN 6*  CREATININE 0.49  CALCIUM 8.8*    Intake/Output Summary (Last 24 hours) at 06/16/2023 1846 Last data filed at 06/16/2023 1700 Gross per 24 hour  Intake 618 ml  Output --  Net 618 ml        Physical Exam: Vital Signs Blood pressure 125/68, pulse 79, temperature 98.3 F (36.8 C), temperature source Oral, resp. rate 16, height 5\' 1"  (1.549 m), weight 64.1 kg, SpO2 96%.  Constitutional: No distress . Vital signs reviewed. HEENT: NCAT, EOMI, oral membranes moist Neck: supple Cardiovascular: RRR, + systolic murmur  Respiratory/Chest: CTA Bilaterally without wheezes or rales. Normal effort    GI/Abdomen: BS +, non-tender, non-distended Ext: no clubbing, cyanosis, or edema Psych: pleasant and cooperative  Skin: Clean and intact without signs of breakdown Neuro:  Alert and oriented x 3. Normal insight and awareness. Intact Memory. Normal language. Sl dysarthric speech. Cranial nerve exam with persistent RUQ vision loss, mild right central 7. MMT: RUE 4- to 4/5 prox to distal. RLE 4- to 4/5. LUE and LLE 5/5.  Sensory exam normal for light touch and pain in all 4 limbs. No limb ataxia or cerebellar signs. No abnormal tone seen.  Musculoskeletal: Full ROM, No pain with AROM or PROM in the neck, trunk, or extremities. Posture appropriate     Assessment/Plan: 1. Functional deficits which require 3+ hours per day of interdisciplinary therapy in a  comprehensive inpatient rehab setting. Physiatrist is providing close team supervision and 24 hour management of active medical problems listed below. Physiatrist and rehab team continue to assess barriers to discharge/monitor patient progress toward functional and medical goals  Care Tool:  Bathing    Body parts bathed by patient: Right arm, Left arm, Chest, Abdomen, Front perineal area, Buttocks, Right upper leg, Left upper leg, Right lower leg, Left lower leg, Face         Bathing assist Assist Level: Contact Guard/Touching assist     Upper Body Dressing/Undressing Upper body dressing   What is the patient wearing?: Pull over shirt    Upper body assist Assist Level: Contact Guard/Touching assist    Lower Body Dressing/Undressing Lower body dressing      What is the patient wearing?: Underwear/pull up, Pants     Lower body assist Assist for lower body dressing: Minimal Assistance - Patient > 75%     Toileting Toileting    Toileting assist Assist for toileting: Contact Guard/Touching assist     Transfers Chair/bed transfer  Transfers assist     Chair/bed transfer assist level: Minimal Assistance - Patient > 75%     Locomotion  Ambulation   Ambulation assist      Assist level: Contact Guard/Touching assist Assistive device: Walker-rolling Max distance: 130 ft   Walk 10 feet activity   Assist     Assist level: Minimal Assistance - Patient > 75% Assistive device: No Device   Walk 50 feet activity   Assist    Assist level: Contact Guard/Touching assist Assistive device: Walker-rolling    Walk 150 feet activity   Assist    Assist level: Contact Guard/Touching assist Assistive device: Walker-rolling    Walk 10 feet on uneven surface  activity   Assist Walk 10 feet on uneven surfaces activity did not occur: Safety/medical concerns         Wheelchair     Assist Is the patient using a wheelchair?: Yes Type of Wheelchair:  Manual    Wheelchair assist level: Dependent - Patient 0% Max wheelchair distance: 150 ft    Wheelchair 50 feet with 2 turns activity    Assist        Assist Level: Dependent - Patient 0%   Wheelchair 150 feet activity     Assist      Assist Level: Dependent - Patient 0%   Blood pressure 125/68, pulse 79, temperature 98.3 F (36.8 C), temperature source Oral, resp. rate 16, height 5\' 1"  (1.549 m), weight 64.1 kg, SpO2 96%.  Medical Problem List and Plan: 1. Functional deficits secondary to left MCA infarct with left MCA M1 occlusion status post thrombectomy             -patient may  shower             -ELOS/Goals: 10-14 days, Mod I to sup with PT/OT/SLP             -Continue CIR therapies including PT, OT, and SLP 2.  Antithrombotics: -DVT/anticoagulation:  Pharmaceutical: Eliquis 2.5 mg twice daily, Eliquis lifelong             -antiplatelet therapy: Aspirin 81 mg daily 3. Pain Management: Tylenol as needed 4. Mood/Behavior/Sleep: Xanax 0.25 mg nightly as needed             -antipsychotic agents: N/A  -11/30 Melatonin PRN insomnia  5. Neuropsych/cognition: This patient is capable of making decisions on her own behalf. 6. Skin/Wound Care: Routine skin checks 7. Fluids/Electrolytes/Nutrition: encourage PO  I personally reviewed the patient's labs today.  WNL  -protein supp for low albumin 8.  Atrial fibrillation as well as history of aortic valve repair.  .  Cardizem 60 mg 4 times daily, Lopressor 25 mg twice daily.  Cardiac rate controlled.  Continue Eliquis as directed             -Consider clarifying with cardiology plan for once a day diltiazem?  -11/29 HR in 80's to 90's   -cardiology changed to diltiazem ER and increased metoprolol to 50mg  bid   -monitor with activity  -11/30 seen by cardiology yesterday- note reviewed, appreciate assistance 9.  Hyperlipidemia.  Pravachol.  No high intensity statin due to LDL at goal. Heart healthy diet 10.  Hypertension.   Long-term goal normotensive.  Home medications amlodipine 5 mg, Lasix 20 mg, hydralazine 25 mg, lisinopril 5 mg, losartan 100 mg             -11/29- metoprolol increased as above.   -observe today, may need further titration  -11/30 intermittently elevated, continue to monitor for now     06/16/2023   12:41 PM 06/16/2023    7:52  AM 06/16/2023    3:44 AM  Vitals with BMI  Systolic 125 119 324  Diastolic 68 65 85  Pulse 79 91 89       LOS: 3 days A FACE TO FACE EVALUATION WAS PERFORMED  [name] 06/16/2023, 6:46 PM

## 2023-06-16 NOTE — Progress Notes (Signed)
Speech Language Pathology Daily Session Note  Patient Details  Name: Sara Donaldson MRN: [id number] Date of Birth: 1930/05/21  Today's Date: 06/16/2023 SLP Individual Time: 1345-1445 SLP Individual Time Calculation (min): 60 min  Short Term Goals: Week 1: SLP Short Term Goal 1 (Week 1): STGs=LTGs d/t ELOS  Skilled Therapeutic Interventions: SLP conducted skilled therapy session targeting cognitive retraining goals. Session targeted immediate memory and use of WRAP memory strategies. SLP introduced WRAP memory strategies and provided patient with written handout to facilitate carryover. Utilized repetition strategy to facilitate immediate recall of 2-step directions. Patient benefited from max-total assist to recall directions immediately after repetition 5x. Presented patient with basic sequencing task with patient benefiting from supervision-min cues to accurately complete. Patient was left in lowered bed with call bell in reach and bed alarm set. SLP will continue to target goals per plan of care.      Pain Pain Assessment Pain Scale: 0-10 Pain Score: 0-No pain  Therapy/Group: Individual Therapy  Jeannie Done, M.A., CCC-SLP  Yetta Barre 06/16/2023, 2:03 PM

## 2023-06-16 NOTE — Progress Notes (Signed)
Physical Therapy Session Note  Patient Details  Name: Sara Donaldson MRN: [id number] Date of Birth: 02-14-1930  Today's Date: 06/16/2023 PT Individual Time: 0806-0921 PT Individual Time Calculation (min): 75 min   Short Term Goals: Week 1:  PT Short Term Goal 1 (Week 1): STG = LTG d/t ELOS  Skilled Therapeutic Interventions/Progress Updates: Patient sitting in recliner on entrance to room. Patient alert and agreeable to PT session.   Patient reported no pain or other complaints this morning, other than not begin able to sleep well (stated this was an issue prior to admission).   Therapeutic Activity: Bed Mobility: Pt performed sit to supine from EOB with CGA. VC required for and HOB lowered for pt to adjust self higher to [facility] with added cues to use B UE to assist in bridge hips with B LE's. Transfers: Pt performed sit<>stand transfers throughout session with supervision and to RW. Provided VC for maintaining safe proximity of RW when sitting vs leaving out to the right with demonstration/education on change in COG. Pt also required VC to reach back to surfaces in order to control descent. Pt transported from room<>main gym in [facility] dependently for energy conservation.   TUG - Starting from edge of mat (avg of 3 below = 23.36s)  - 25.25s   - 24.31s  - 20.53s  5xSTS - Sitting edge of mat (avg of 3 below = 28.12s). Pt initially performed sit to stand without VC (other than using UE to control descent). Pt presented with posterior lean with decreased ability to extend fully at hips to center weight. Pt also with decreased control of eccentric with B LE's elevating off of floor (despite VC provided after first trial) and decreased forward lean.   - 23.34s   - 34.76s  - 26.49s  Gait Training:  Pt ambulated 100' x 1 using RW with CGA for safety. Pt demonstrated the following gait deviations with therapist providing the described cuing and facilitation for improvement:  - Decreased B step  clearance with VC to increase (pt slightly increased height but did not maintain) - Downward gaze with cues to look forward ahead in order to increase safety awareness (some R sided inattention with pt almost bumping into objects on R side, but adjusting at the last minute to avoid) - B LE's in external rotation (unknown as to whether this was something prior or after admission).  - R sway of RW (pt with torso slightly twisted to the R, causing R shoulder to be more posterior to L) Pt ambulated another 100' using RW with close supervision and VC/manual placement of B shoulders in alignment with noted decrease in R sway - pt unable to maintain for more than a few seconds without assistance)  Neuromuscular Re-ed: - Pt participated in multiple reps of sit to stands from edge of mat with focus on increasing forward lean as pt shows decrease in anterior weight shift that leads to reliance on B back of knees on edge of mat, and COG shifting posteriorly that causes pt to have decreased control to maintain standing balance, as well as decreased eccentric control. PTA provided demonstration and education with pt seemingly understanding, but may need further reinforcement of safety mechanics.   NMR performed for improvements in motor control and coordination, balance, sequencing, judgement, and self confidence/ efficacy in performing all aspects of mobility at highest level of independence.   Patient supine in bed at end of session with brakes locked, bed alarm set, and all needs within  reach.      Therapy Documentation Precautions:  Precautions Precautions: Fall Precaution Comments: mild R inattention and hemipareisis Restrictions Weight Bearing Restrictions: No  Therapy/Group: Individual Therapy  Andre Swander PTA 06/16/2023, 12:25 PM

## 2023-06-16 NOTE — Progress Notes (Signed)
Occupational Therapy Session Note  Patient Details  Name: Sara Donaldson MRN: [id number] Date of Birth: 02-07-1930  {CHL IP REHAB OT TIME CALCULATIONS:304400400}  {CHL IP REHAB OT TIME CALCULATIONS:304400400}  Short Term Goals: Week 1:  OT Short Term Goal 1 (Week 1): STG=LTG  Skilled Therapeutic Interventions/Progress Updates:   Session 1: Pt greeted *** for skilled OT session with focus on ***.   Pain: Pt reported ***/10 pain, stating "***" in reference to ***. OT offering intermediate rest breaks and positioning suggestions throughout session to address pain/fatigue and maximize participation/safety in session.   Functional Transfers:  Self Care Tasks:  Therapeutic Activities:  Therapeutic Exercise:   Education:  Pt remained *** with 4Ps assessed and immediate needs met. Pt continues to be appropriate for skilled OT intervention to promote further functional independence in ADLs/IADLs.   Session 2: Pt greeted *** for skilled OT session with focus on ***.   Pain: Pt reported ***/10 pain, stating "***" in reference to ***. OT offering intermediate rest breaks and positioning suggestions throughout session to address pain/fatigue and maximize participation/safety in session.   Functional Transfers:  Self Care Tasks:  Therapeutic Activities:  Therapeutic Exercise:   Education:  Pt remained *** with 4Ps assessed and immediate needs met. Pt continues to be appropriate for skilled OT intervention to promote further functional independence in ADLs/IADLs.   Therapy Documentation Precautions:  Precautions Precautions: Fall Precaution Comments: mild R inattention and hemipareisis Restrictions Weight Bearing Restrictions: No   Therapy/Group: Individual Therapy  Lou Cal, OTR/L, MSOT  06/16/2023, 12:42 PM

## 2023-06-16 NOTE — Plan of Care (Signed)
  Problem: Consults Goal: RH STROKE PATIENT EDUCATION Description: See Patient Education module for education specifics  Outcome: Progressing   Problem: RH BOWEL ELIMINATION Goal: RH STG MANAGE BOWEL WITH ASSISTANCE Description: STG Manage Bowel with min Assistance. Outcome: Progressing   Problem: RH BLADDER ELIMINATION Goal: RH STG MANAGE BLADDER WITH ASSISTANCE Description: STG Manage Bladder With min Assistance Outcome: Progressing   Problem: RH SKIN INTEGRITY Goal: RH STG SKIN FREE OF INFECTION/BREAKDOWN Description: Incision will remain intact and free of infection/breakdown with min assist  Outcome: Progressing Goal: RH STG ABLE TO PERFORM INCISION/WOUND CARE W/ASSISTANCE Description: STG Able To Perform Incision/Wound Care With min Assistance. Outcome: Progressing   Problem: RH SAFETY Goal: RH STG ADHERE TO SAFETY PRECAUTIONS W/ASSISTANCE/DEVICE Description: STG Adhere to Safety Precautions With min Assistance/Device. Outcome: Progressing Goal: RH STG DECREASED RISK OF FALL WITH ASSISTANCE Description: STG Decreased Risk of Fall With min Assistance. Outcome: Progressing   Problem: RH PAIN MANAGEMENT Goal: RH STG PAIN MANAGED AT OR BELOW PT'S PAIN GOAL Description: Less than 4 with PRN medications Outcome: Progressing   Problem: RH KNOWLEDGE DEFICIT Goal: RH STG INCREASE KNOWLEDGE OF STROKE PROPHYLAXIS Description: Patient/caregiver will be able to manage new medications related to stroke and Afib from nursing education, nursing handouts and other resources independently  Outcome: Progressing

## 2023-06-17 DIAGNOSIS — I10 Essential (primary) hypertension: Secondary | ICD-10-CM | POA: Diagnosis not present

## 2023-06-17 DIAGNOSIS — E785 Hyperlipidemia, unspecified: Secondary | ICD-10-CM | POA: Diagnosis not present

## 2023-06-17 DIAGNOSIS — I63512 Cerebral infarction due to unspecified occlusion or stenosis of left middle cerebral artery: Secondary | ICD-10-CM | POA: Diagnosis not present

## 2023-06-17 DIAGNOSIS — I4891 Unspecified atrial fibrillation: Secondary | ICD-10-CM | POA: Diagnosis not present

## 2023-06-17 MED ORDER — LOSARTAN POTASSIUM 50 MG PO TABS
100.0000 mg | ORAL_TABLET | Freq: Every day | ORAL | Status: DC
  Administered 2023-06-17 – 2023-06-22 (×6): 100 mg via ORAL
  Filled 2023-06-17 (×6): qty 2

## 2023-06-17 NOTE — Progress Notes (Signed)
Physical Therapy Session Note  Patient Details  Name: Sara Donaldson MRN: [id number] Date of Birth: 03-07-1930  Today's Date: 06/17/2023 PT Individual Time: 5956-3875, 6433-2951 PT Individual Time Calculation (min): 54 min, 33 min   Short Term Goals: Week 1:  PT Short Term Goal 1 (Week 1): STG = LTG d/t ELOS  Skilled Therapeutic Interventions/Progress Updates:      Treatment Session 1  Pt seated in recliner with daughter Kendal Hymen in room upon arrrival. Pt agreable to therapy. Pt denies any pain.   Pt reports need to use the bathroom. Pt ambulated with RW and CGA to [facility], pt continent of bladder, pt performed pericare and donned/doffed pants with CGA.   Pt seated in recliner, pt donned socks and shoes with combination of figure 4 positioning and bending over with CGA espeically for L LE 2/2 R lateral trunk lean.   Pt ambulated from room to main gym, main gym to room with R HHA, verbal cues provided for reciprocal gait and forward gaze.   Pt ascended/descended 4 6 inch steps with B HR and reciprocal gait with CGA, pt ascended/descended 4 6 inch steps with unilateral handrail and step to gait with min A, vebral cues provided for sequencing.   Pt performed 2x10 toe taps B on 6 inch step with unilateral handrail progressing to no handrail with CGA/ min A, verbal/tactile cues provided for R LE hip and knee flexion versus hip external rotation compensation for advancement.   Pt practiced navigating obstacles, and turns, pt ambulated around 5 cones (4 cones in shape of square with 5th cone in the middle, pt rounded corner and around middle cone each time with CGA/min A, with decreased cadence and step length in comparison to forward ambulation.  Pt seated in recliner at end of session with all needs within reach and bed alarm on.    Treatment Session 2   Pt ambulated from room to ortho gym, ortho gym to room with no AD with CGA/light min A, verbal cues provided for decreasing BOS and reciprocal  stepping.   Pt navigated 8 6 inch hurdles with step to gait leading with R LE, 8 hurdles leading with L LE, and 8 6 inch hurdles with reciprocal gait, with R HHA, with 1 episode of LOB requiring heavy min A for correction, verbal cues provided for technique.   Pt performed sit to stand x10 with arms across chest with CGA/sup, sit to stand x10 with airex pad under L LE x10 with arms across chest with CGA for stability, to facilitate increased weight shift to R LE  Sit to supine with mod A for LE, verbal cues provided for technique. Pt supine in bed with bed alarm on and all needs within reach           Therapy Documentation Precautions:  Precautions Precautions: Fall Precaution Comments: mild R inattention and hemipareisis Restrictions Weight Bearing Restrictions: No  Therapy/Group: Individual Therapy  Ambrose Finland 06/17/2023, 7:49 AM

## 2023-06-17 NOTE — Consult Note (Signed)
Ref: Sara Cobb, MD   Subjective:  Awake. Improving ambulation. HR in 70-85 range. BP is mildly elevated.  Objective:  Vital Signs in the last 24 hours: Temp:  [98.1 F (36.7 C)] 98.1 F (36.7 C) (12/01 0346) Pulse Rate:  [82-85] 82 (12/01 0346) Resp:  [18] 18 (12/01 0346) BP: (130-147)/(88-97) 147/97 (12/01 0346) SpO2:  [94 %-98 %] 94 % (12/01 0346)  Physical Exam: BP Readings from Last 1 Encounters:  06/17/23 (!) 147/97     Wt Readings from Last 1 Encounters:  06/13/23 64.1 kg    Weight change:  Body mass index is 26.7 kg/m. HEENT: [place]/AT, Eyes-Blue, Conjunctiva-Pink, Sclera-Non-icteric Neck: No JVD, No bruit, Trachea midline. Lungs:  Clear, Bilateral. Cardiac:  Irregular rhythm, normal S1 and S2, no S3. II/VI systolic murmur. Abdomen:  Soft, non-tender. BS present. Extremities:  No edema present. No cyanosis. No clubbing. CNS: AxOx3, Cranial nerves grossly intact, moves all 4 extremities. Right sided weakness marginally improving. Skin: Warm and dry.   Intake/Output from previous day: 11/30 0701 - 12/01 0700 In: 268 [P.O.:268] Out: -     Lab Results: BMET    Component Value Date/Time   NA 138 06/14/2023 0417   NA 138 06/13/2023 0519   NA 138 06/12/2023 0426   K 3.9 06/14/2023 0417   K 3.9 06/13/2023 0519   K 3.8 06/12/2023 0426   CL 108 06/14/2023 0417   CL 104 06/13/2023 0519   CL 105 06/12/2023 0426   CO2 25 06/14/2023 0417   CO2 24 06/13/2023 0519   CO2 24 06/12/2023 0426   GLUCOSE 102 (H) 06/14/2023 0417   GLUCOSE 112 (H) 06/13/2023 0519   GLUCOSE 104 (H) 06/12/2023 0426   BUN 6 (L) 06/14/2023 0417   BUN 9 06/13/2023 0519   BUN 14 06/12/2023 0426   CREATININE 0.49 06/14/2023 0417   CREATININE 0.61 06/13/2023 0519   CREATININE 0.65 06/12/2023 0426   CALCIUM 8.8 (L) 06/14/2023 0417   CALCIUM 9.0 06/13/2023 0519   CALCIUM 9.0 06/12/2023 0426   GFRNONAA >60 06/14/2023 0417   GFRNONAA >60 06/13/2023 0519   GFRNONAA >60 06/12/2023 0426    GFRAA >60 06/08/2018 1632   GFRAA >60 05/27/2018 0925   GFRAA >90 03/07/2014 0410   CBC    Component Value Date/Time   WBC 6.3 06/14/2023 0556   RBC 4.85 06/14/2023 0556   HGB 13.9 06/14/2023 0556   HCT 43.7 06/14/2023 0556   PLT 156 06/14/2023 0556   MCV 90.1 06/14/2023 0556   MCH 28.7 06/14/2023 0556   MCHC 31.8 06/14/2023 0556   RDW 13.2 06/14/2023 0556   LYMPHSABS 1.3 06/14/2023 0556   MONOABS 0.7 06/14/2023 0556   EOSABS 0.2 06/14/2023 0556   BASOSABS 0.0 06/14/2023 0556   HEPATIC Function Panel Recent Labs    06/10/23 1219 06/14/23 0417  PROT 7.5 6.4*  ALBUMIN 4.1 3.3*  AST 31 30  ALT 19 18  ALKPHOS 67 53   HEMOGLOBIN A1C Lab Results  Component Value Date   MPG 108.28 06/10/2023   CARDIAC ENZYMES No results found for: "CKTOTAL", "CKMB", "CKMBINDEX", "TROPONINI" BNP No results for input(s): "PROBNP" in the last 8760 hours. TSH No results for input(s): "TSH" in the last 8760 hours. CHOLESTEROL Recent Labs    06/11/23 0538  CHOL 117    Scheduled Meds:  apixaban  2.5 mg Oral BID   aspirin EC  81 mg Oral Daily   diltiazem  240 mg Oral Daily   losartan  100  mg Oral Daily   metoprolol tartrate  50 mg Oral BID   pravastatin  40 mg Oral q1800   Ensure Max Protein  11 oz Oral Daily   Continuous Infusions: PRN Meds:.acetaminophen **OR** acetaminophen (TYLENOL) oral liquid 160 mg/5 mL **OR** acetaminophen, ALPRAZolam, melatonin  Assessment/Plan: Acute left MCA stroke, embolic Atrial fibrillation, [id number] score of 6 HTN HLD Obesity S/P AV replacement, 21 mm Edwards, Pericardial  Plan: Resume Losartan 100 mg. Daily.   LOS: 4 days   Time spent including chart review, lab review, examination, discussion with patient : 30 min   Sara Cobb  MD  06/17/2023, 3:23 PM

## 2023-06-17 NOTE — Progress Notes (Signed)
PROGRESS NOTE   Subjective/Complaints: Working with therapy in the gym this morning.  She reports she slept much better yesterday.  No additional concerns this morning.  ROS: Patient denies fever, rash, sore throat,  dizziness, nausea, vomiting, diarrhea, cough, shortness of breath or chest pain, joint or back/neck pain, headache, or mood change.  + insomnia-improved   Objective:   No results found. No results for input(s): "WBC", "HGB", "HCT", "PLT" in the last 72 hours.  No results for input(s): "NA", "K", "CL", "CO2", "GLUCOSE", "BUN", "CREATININE", "CALCIUM" in the last 72 hours.   Intake/Output Summary (Last 24 hours) at 06/17/2023 1129 Last data filed at 06/17/2023 0700 Gross per 24 hour  Intake 268 ml  Output --  Net 268 ml        Physical Exam: Vital Signs Blood pressure (!) 147/97, pulse 82, temperature 98.1 F (36.7 C), temperature source Oral, resp. rate 18, height 5\' 1"  (1.549 m), weight 64.1 kg, SpO2 94%.  Constitutional: No distress . Vital signs reviewed. HEENT: NCAT, EOMI, oral membranes moist Neck: supple Cardiovascular: Irregular Rhythm, + systolic murmur  Respiratory/Chest: CTA Bilaterally without wheezes or rales. Normal effort    GI/Abdomen: BS +, non-tender, non-distended Ext: no clubbing, cyanosis, or edema Psych: pleasant and cooperative  Skin: Clean and intact without signs of breakdown Neuro:  Alert and oriented x 3. Normal insight and awareness. Intact Memory. Normal language. Sl dysarthric speech. Cranial nerve exam with persistent RUQ vision loss, mild right central 7. MMT: RUE 4- to 4/5 prox to distal. RLE 4- to 4/5. LUE and LLE 5/5.  Sensory exam normal for light touch  all 4 limbs. No limb ataxia or cerebellar signs. No abnormal tone seen.  Ambulating in the gym with rolling walker and contact-guard assist Musculoskeletal: Full ROM, No pain with AROM or PROM in the neck, trunk, or  extremities. Posture appropriate     Assessment/Plan: 1. Functional deficits which require 3+ hours per day of interdisciplinary therapy in a comprehensive inpatient rehab setting. Physiatrist is providing close team supervision and 24 hour management of active medical problems listed below. Physiatrist and rehab team continue to assess barriers to discharge/monitor patient progress toward functional and medical goals  Care Tool:  Bathing    Body parts bathed by patient: Right arm, Left arm, Chest, Abdomen, Front perineal area, Buttocks, Right upper leg, Left upper leg, Right lower leg, Left lower leg, Face         Bathing assist Assist Level: Contact Guard/Touching assist     Upper Body Dressing/Undressing Upper body dressing   What is the patient wearing?: Pull over shirt    Upper body assist Assist Level: Contact Guard/Touching assist    Lower Body Dressing/Undressing Lower body dressing      What is the patient wearing?: Underwear/pull up, Pants     Lower body assist Assist for lower body dressing: Minimal Assistance - Patient > 75%     Toileting Toileting    Toileting assist Assist for toileting: Contact Guard/Touching assist     Transfers Chair/bed transfer  Transfers assist     Chair/bed transfer assist level: Minimal Assistance - Patient > 75%     Locomotion  Ambulation   Ambulation assist      Assist level: Contact Guard/Touching assist Assistive device: Walker-rolling Max distance: 130 ft   Walk 10 feet activity   Assist     Assist level: Minimal Assistance - Patient > 75% Assistive device: No Device   Walk 50 feet activity   Assist    Assist level: Contact Guard/Touching assist Assistive device: Walker-rolling    Walk 150 feet activity   Assist    Assist level: Contact Guard/Touching assist Assistive device: Walker-rolling    Walk 10 feet on uneven surface  activity   Assist Walk 10 feet on uneven surfaces  activity did not occur: Safety/medical concerns         Wheelchair     Assist Is the patient using a wheelchair?: Yes Type of Wheelchair: Manual    Wheelchair assist level: Dependent - Patient 0% Max wheelchair distance: 150 ft    Wheelchair 50 feet with 2 turns activity    Assist        Assist Level: Dependent - Patient 0%   Wheelchair 150 feet activity     Assist      Assist Level: Dependent - Patient 0%   Blood pressure (!) 147/97, pulse 82, temperature 98.1 F (36.7 C), temperature source Oral, resp. rate 18, height 5\' 1"  (1.549 m), weight 64.1 kg, SpO2 94%.  Medical Problem List and Plan: 1. Functional deficits secondary to left MCA infarct with left MCA M1 occlusion status post thrombectomy             -patient may  shower             -ELOS/Goals: 10-14 days, Mod I to sup with PT/OT/SLP             -Continue CIR therapies including PT, OT, and SLP 2.  Antithrombotics: -DVT/anticoagulation:  Pharmaceutical: Eliquis 2.5 mg twice daily, Eliquis lifelong             -antiplatelet therapy: Aspirin 81 mg daily 3. Pain Management: Tylenol as needed 4. Mood/Behavior/Sleep: Xanax 0.25 mg nightly as needed             -antipsychotic agents: N/A  -11/30 Melatonin PRN insomnia   12/1 insomnia improved continue current regimen 5. Neuropsych/cognition: This patient is capable of making decisions on her own behalf. 6. Skin/Wound Care: Routine skin checks 7. Fluids/Electrolytes/Nutrition: encourage PO  I personally reviewed the patient's labs today.  WNL  -protein supp for low albumin 8.  Atrial fibrillation as well as history of aortic valve repair.  .  Cardizem 60 mg 4 times daily, Lopressor 25 mg twice daily.  Cardiac rate controlled.  Continue Eliquis as directed             -Consider clarifying with cardiology plan for once a day diltiazem?  -11/29 HR in 80's to 90's   -cardiology changed to diltiazem ER and increased metoprolol to 50mg  bid   -monitor with  activity  -11/30 seen by cardiology yesterday- note reviewed, appreciate assistance  12/1 HR controlled, continue current medications 9.  Hyperlipidemia.  Pravachol.  No high intensity statin due to LDL at goal. Heart healthy diet 10.  Hypertension.  Long-term goal normotensive.  Home medications amlodipine 5 mg, Lasix 20 mg, hydralazine 25 mg, lisinopril 5 mg, losartan 100 mg             -11/29- metoprolol increased as above.   -observe today, may need further titration  -12/1 intermittently elevated but overall controlled.  Continue current medications     06/17/2023    3:46 AM 06/16/2023    7:32 PM 06/16/2023   12:41 PM  Vitals with BMI  Systolic 147 130 161  Diastolic 97 88 68  Pulse 82 85 79       LOS: 4 days A FACE TO FACE EVALUATION WAS PERFORMED  [name] 06/17/2023, 11:29 AM

## 2023-06-17 NOTE — Discharge Summary (Signed)
Physician Discharge Summary  Patient ID: Sara Donaldson MRN: [id number] DOB/AGE: 87-Jun-1931 75 y.o.  Admit date: 06/13/2023 Discharge date: 06/22/2023  Discharge Diagnoses:  Principal Problem:   Left middle cerebral artery stroke [facility]) DVT prophylaxis Atrial fibrillation with history of aortic valve replacement Hyperlipidemia Hypertension Obesity Mood stabilization  Discharged Condition: Stable  Significant Diagnostic Studies: VAS [place] LOWER EXTREMITY VENOUS (DVT)  Result Date: 06/19/2023  Lower Venous DVT Study Patient Name:  Sara Donaldson  Date of Exam:   06/11/2023 Medical Rec #: [id number]     Accession #:    [id number] Date of Birth: 12-11-1929     Patient Gender: F Patient Age:   47 years Exam Location:  [facility] Procedure:      VAS [place] LOWER EXTREMITY VENOUS (DVT) Referring Phys: Scheryl Marten XU --------------------------------------------------------------------------------  Indications: Stroke.  Comparison Study: No previous exams Performing Technologist: Jody Hill RVT, RDMS  Examination Guidelines: A complete evaluation includes B-mode imaging, spectral Doppler, color Doppler, and power Doppler as needed of all accessible portions of each vessel. Bilateral testing is considered an integral part of a complete examination. Limited examinations for reoccurring indications may be performed as noted. The reflux portion of the exam is performed with the patient in reverse Trendelenburg.    *See table(s) above for measurements and observations. Electronically signed by Lemar Livings MD on 06/19/2023 at 5:14:15 PM.    Final    ECHOCARDIOGRAM COMPLETE  Result Date: 06/12/2023    ECHOCARDIOGRAM REPORT   Patient Name:   Sara Donaldson Date of Exam: 06/12/2023 Medical Rec #:  [id number]    Height:       61.0 in Accession #:    [id number]   Weight:       144.6 lb Date of Birth:  19-Dec-1929    BSA:          1.646 m Patient Age:    89 years     BP:           105/60 mmHg Patient Gender: F            HR:            115 bpm. Exam Location:  Inpatient Procedure: 2D Echo Indications:     I48.0 Paroxysmal atrial fibrillation  History:         Patient has prior history of Echocardiogram examinations. Prior                  Cardiac Surgery, Stroke, Aortic Valve Disease and Mitral Valve                  Disease, Arrythmias:Atrial Fibrillation,                  Signs/Symptoms:Hypertensive Heart Disease; Risk                  Factors:Hypertension.                  Aortic Valve: 21 mm Edwards pericardial valve is present in the                  aortic position.  Sonographer:     Dondra Prader RVT Referring Phys:  [facility] Diagnosing Phys: Orpah Cobb MD IMPRESSIONS  1. Left ventricular ejection fraction, by estimation, is 55 to 60%. The left ventricle has normal function. The left ventricle has no regional wall motion abnormalities. Left ventricular diastolic parameters are indeterminate.  2. Right ventricular systolic function is  low normal. The right ventricular size is normal.  3. Left atrial size was moderately dilated.  4. Right atrial size was moderately dilated.  5. The mitral valve is degenerative. Mild to moderate mitral valve regurgitation. Moderate mitral annular calcification.  6. Tricuspid valve regurgitation is moderate to severe.  7. The aortic valve has been repaired/replaced. There is mild calcification of the aortic valve. There is mild thickening of the aortic valve. Aortic valve regurgitation is not visualized. Aortic valve sclerosis is present, with no evidence of aortic valve stenosis. There is a 21 mm Edwards pericardial valve present in the aortic position.  8. There is Moderate (Grade III) atheroma plaque involving the aortic root and ascending aorta.  9. The inferior vena cava is dilated in size with <50% respiratory variability, suggesting right atrial pressure of 15 mmHg. FINDINGS  Left Ventricle: Left ventricular ejection fraction, by estimation, is 55 to 60%. The left ventricle has normal  function. The left ventricle has no regional wall motion abnormalities. The left ventricular internal cavity size was normal in size. There is  borderline concentric left ventricular hypertrophy. Left ventricular diastolic parameters are indeterminate. Right Ventricle: The right ventricular size is normal. No increase in right ventricular wall thickness. Right ventricular systolic function is low normal. Left Atrium: Left atrial size was moderately dilated. Right Atrium: Right atrial size was moderately dilated. Pericardium: There is no evidence of pericardial effusion. Mitral Valve: The mitral valve is degenerative in appearance. Moderate mitral annular calcification. Mild to moderate mitral valve regurgitation. Tricuspid Valve: The tricuspid valve is normal in structure. Tricuspid valve regurgitation is moderate to severe. Aortic Valve: The aortic valve has been repaired/replaced. There is mild calcification of the aortic valve. There is mild thickening of the aortic valve. There is moderate to severe aortic valve annular calcification. Aortic valve regurgitation is not visualized. Aortic valve sclerosis is present, with no evidence of aortic valve stenosis. Aortic valve mean gradient measures 5.0 mmHg. Aortic valve peak gradient measures 9.5 mmHg. Aortic valve area, by VTI measures 2.19 cm. There is a 21 mm Edwards pericardial valve present in the aortic position. Pulmonic Valve: The pulmonic valve was normal in structure. Pulmonic valve regurgitation is not visualized. Aorta: The aortic root is normal in size and structure. There is moderate (Grade III) atheroma plaque involving the aortic root and ascending aorta. Venous: The inferior vena cava is dilated in size with less than 50% respiratory variability, suggesting right atrial pressure of 15 mmHg. IAS/Shunts: The atrial septum is grossly normal.  LEFT VENTRICLE PLAX 2D LVIDd:         3.80 cm LVIDs:         2.60 cm LV PW:         0.90 cm LV IVS:        1.10  cm LVOT diam:     1.80 cm LV SV:         43 LV SV Index:   26 LVOT Area:     2.54 cm  IVC IVC diam: 2.10 cm LEFT ATRIUM           Index        RIGHT ATRIUM           Index LA diam:      3.50 cm 2.13 cm/m   RA Area:     17.60 cm LA Vol (A4C): 68.9 ml 41.87 ml/m  RA Volume:   48.30 ml  29.35 ml/m  AORTIC VALVE  PULMONIC VALVE AV Area (Vmax):    1.98 cm     PV Vmax:       0.88 m/s AV Area (Vmean):   1.93 cm     PV Peak grad:  3.1 mmHg AV Area (VTI):     2.19 cm AV Vmax:           154.00 cm/s AV Vmean:          99.333 cm/s AV VTI:            0.198 m AV Peak Grad:      9.5 mmHg AV Mean Grad:      5.0 mmHg LVOT Vmax:         120.00 cm/s LVOT Vmean:        75.300 cm/s LVOT VTI:          0.170 m LVOT/AV VTI ratio: 0.86  AORTA Ao Root diam: 2.30 cm Ao Asc diam:  3.70 cm MITRAL VALVE                TRICUSPID VALVE MV Area (PHT): 5.50 cm     TR Peak grad:   39.2 mmHg MV Decel Time: 138 msec     TR Vmax:        313.00 cm/s MV E velocity: 146.00 cm/s                             SHUNTS                             Systemic VTI:  0.17 m                             Systemic Diam: 1.80 cm Orpah Cobb MD Electronically signed by Orpah Cobb MD Signature Date/Time: 06/12/2023/8:58:34 AM    Final    IR PERCUTANEOUS ART THROMBECTOMY/INFUSION INTRACRANIAL INC DIAG ANGIO  Result Date: 06/12/2023 INDICATION: New onset aphasia, left gaze deviation and right-sided weakness. Occluded left middle cerebral artery M1 segment on CT angiogram of the head and neck. EXAM: 1. EMERGENT LARGE VESSEL OCCLUSION THROMBOLYSIS (anterior CIRCULATION) COMPARISON:  CT angiogram of the head and neck June 10, 2023. MEDICATIONS: Ancef 2 g IV antibiotic was administered within 1 hour of the procedure. ANESTHESIA/SEDATION: General anesthesia. CONTRAST:  Omnipaque 300 approximately 70 mL. FLUOROSCOPY TIME:  Fluoroscopy Time: 17 minutes 42 seconds (830 mGy). COMPLICATIONS: None immediate. TECHNIQUE: Following a full explanation of  the procedure along with the potential associated complications, an informed witnessed consent was obtained. The risks of intracranial hemorrhage of 10%, worsening neurological deficit, ventilator dependency, death and inability to revascularize were all reviewed in detail with the patient's family. The patient was then put under general anesthesia by the Department of Anesthesiology at [facility]. The right groin was prepped and draped in the usual sterile fashion. Thereafter using modified Seldinger technique, transfemoral access into the right common femoral artery was obtained without difficulty. Over an 0.035 inch guidewire an 8 French 25 cm Pinnacle sheath was inserted. Through this, and also over an 0.035 inch guidewire a combination of a 125 cm 6 [place] Berenstein support catheter inside of an 087 95 cm balloon guide catheter was advanced and positioned in the left common carotid artery and then distally in the left internal carotid artery. FINDINGS: The left common carotid arteriogram demonstrates the left external  carotid artery, and its major branches to be widely patent. The left internal carotid artery at the bulb to the cranial skull base is widely patent with moderate tortuosity in its proximal 1/3. The petrous, the cavernous and the supraclinoid left ICA are widely patent. Left anterior cerebral artery opacifies into the capillary and venous phases. Left middle cerebral artery demonstrates occlusion in its mid 1/3. Opacification of the anterior temporal branch is noted. PROCEDURE: Through the balloon guide catheter in the mid cervical left ICA, a combination of an 021 162 Trevo ProVue microcatheter inside of an 071 132 cm Zoom aspiration catheter was advanced over an 018 inch micro guidewire with a moderate J configuration to the supraclinoid left ICA. The micro guidewire was then navigated without difficulty using a torque device through the occluded left M1 segment into the M2 region of  the inferior division followed by the microcatheter. The micro guidewire was removed. Good aspiration was obtained from the hub of the microcatheter. A gentle control arteriogram through the microcatheter demonstrated safe positioning of the tip of the microcatheter which was then connected to continuous heparinized saline infusion. A 4 mm x 40 mm Solitaire X retrieval device was then advanced and positioned in the usual manner such that the proximal portion of the device was just proximal to the occluded segment. At this time, the Zoom aspiration catheter was advanced to engage the proximal portion of the occluded segment of the left M1. Constant aspiration was then applied at the hub of the balloon guide catheter with a 20 mL syringe, and at the hub of the Zoom aspiration catheter with the Penumbra aspiration pump for a minute and a half. Thereafter, the combination of the retrieval device the aspiration catheter, and the microcatheter were removed. A control arteriogram performed through the balloon guide catheter in the distal left ICA demonstrated complete revascularization of the left middle cerebral artery distribution achieving a TICI 3 revascularization. The left anterior cerebral artery remained widely patent. Control arteriograms were then performed at 10 and 20 minutes post revascularization. This continued to demonstrate patency of the left middle cerebral artery unchanged. A small non flow-limiting dissection was evident in the mid left ICA without evidence of filling defects. A final control arteriogram performed through the balloon guide catheter in the left common carotid artery demonstrated wide patency of the left internal carotid artery proximally and distally. The left middle and the left anterior cerebral arteries continued to demonstrate wide patency with TICI 3 revascularization. The balloon guide was removed. A flat panel CT of the brain demonstrated no evidence of hemorrhagic complications.  An 8 French closure device was deployed at the right groin puncture site for hemostasis. Distal pulses remained palpable in both feet unchanged. The patient was extubated. Upon recovery, the patient was able to follow simple commands. No facial asymmetry was apparent. Tongue was midline. Patient was able to raise her right arm and leg against gravity. The patient was then transferred to the neuro ICU for post revascularization care. IMPRESSION: Status post complete revascularization of left middle cerebral M1 segment with 1 pass with a 4 mm x 40 mm Solitaire X retrieval device, and contact aspiration achieving a TICI 3 revascularization. PLAN: As per referring MD. Electronically Signed   By: Julieanne Cotton M.D.   On: 06/12/2023 08:07   IR CT Head Ltd  Result Date: 06/12/2023 INDICATION: New onset aphasia, left gaze deviation and right-sided weakness. Occluded left middle cerebral artery M1 segment on CT angiogram of the head  and neck. EXAM: 1. EMERGENT LARGE VESSEL OCCLUSION THROMBOLYSIS (anterior CIRCULATION) COMPARISON:  CT angiogram of the head and neck June 10, 2023. MEDICATIONS: Ancef 2 g IV antibiotic was administered within 1 hour of the procedure. ANESTHESIA/SEDATION: General anesthesia. CONTRAST:  Omnipaque 300 approximately 70 mL. FLUOROSCOPY TIME:  Fluoroscopy Time: 17 minutes 42 seconds (830 mGy). COMPLICATIONS: None immediate. TECHNIQUE: Following a full explanation of the procedure along with the potential associated complications, an informed witnessed consent was obtained. The risks of intracranial hemorrhage of 10%, worsening neurological deficit, ventilator dependency, death and inability to revascularize were all reviewed in detail with the patient's family. The patient was then put under general anesthesia by the Department of Anesthesiology at [facility]. The right groin was prepped and draped in the usual sterile fashion. Thereafter using modified Seldinger technique,  transfemoral access into the right common femoral artery was obtained without difficulty. Over an 0.035 inch guidewire an 8 French 25 cm Pinnacle sheath was inserted. Through this, and also over an 0.035 inch guidewire a combination of a 125 cm 6 [place] Berenstein support catheter inside of an 087 95 cm balloon guide catheter was advanced and positioned in the left common carotid artery and then distally in the left internal carotid artery. FINDINGS: The left common carotid arteriogram demonstrates the left external carotid artery, and its major branches to be widely patent. The left internal carotid artery at the bulb to the cranial skull base is widely patent with moderate tortuosity in its proximal 1/3. The petrous, the cavernous and the supraclinoid left ICA are widely patent. Left anterior cerebral artery opacifies into the capillary and venous phases. Left middle cerebral artery demonstrates occlusion in its mid 1/3. Opacification of the anterior temporal branch is noted. PROCEDURE: Through the balloon guide catheter in the mid cervical left ICA, a combination of an 021 162 Trevo ProVue microcatheter inside of an 071 132 cm Zoom aspiration catheter was advanced over an 018 inch micro guidewire with a moderate J configuration to the supraclinoid left ICA. The micro guidewire was then navigated without difficulty using a torque device through the occluded left M1 segment into the M2 region of the inferior division followed by the microcatheter. The micro guidewire was removed. Good aspiration was obtained from the hub of the microcatheter. A gentle control arteriogram through the microcatheter demonstrated safe positioning of the tip of the microcatheter which was then connected to continuous heparinized saline infusion. A 4 mm x 40 mm Solitaire X retrieval device was then advanced and positioned in the usual manner such that the proximal portion of the device was just proximal to the occluded segment. At this  time, the Zoom aspiration catheter was advanced to engage the proximal portion of the occluded segment of the left M1. Constant aspiration was then applied at the hub of the balloon guide catheter with a 20 mL syringe, and at the hub of the Zoom aspiration catheter with the Penumbra aspiration pump for a minute and a half. Thereafter, the combination of the retrieval device the aspiration catheter, and the microcatheter were removed. A control arteriogram performed through the balloon guide catheter in the distal left ICA demonstrated complete revascularization of the left middle cerebral artery distribution achieving a TICI 3 revascularization. The left anterior cerebral artery remained widely patent. Control arteriograms were then performed at 10 and 20 minutes post revascularization. This continued to demonstrate patency of the left middle cerebral artery unchanged. A small non flow-limiting dissection was evident in the mid left  ICA without evidence of filling defects. A final control arteriogram performed through the balloon guide catheter in the left common carotid artery demonstrated wide patency of the left internal carotid artery proximally and distally. The left middle and the left anterior cerebral arteries continued to demonstrate wide patency with TICI 3 revascularization. The balloon guide was removed. A flat panel CT of the brain demonstrated no evidence of hemorrhagic complications. An 8 French closure device was deployed at the right groin puncture site for hemostasis. Distal pulses remained palpable in both feet unchanged. The patient was extubated. Upon recovery, the patient was able to follow simple commands. No facial asymmetry was apparent. Tongue was midline. Patient was able to raise her right arm and leg against gravity. The patient was then transferred to the neuro ICU for post revascularization care. IMPRESSION: Status post complete revascularization of left middle cerebral M1 segment with  1 pass with a 4 mm x 40 mm Solitaire X retrieval device, and contact aspiration achieving a TICI 3 revascularization. PLAN: As per referring MD. Electronically Signed   By: Julieanne Cotton M.D.   On: 06/12/2023 08:07   MR BRAIN WO CONTRAST  Result Date: 06/11/2023 CLINICAL DATA:  Stroke-like symptoms, status post thrombectomy for left M1 occlusion EXAM: MRI HEAD WITHOUT CONTRAST TECHNIQUE: Multiplanar, multiecho pulse sequences of the brain and surrounding structures were obtained without intravenous contrast. COMPARISON:  06/10/2023 CT head, no prior MRI available FINDINGS: Brain: Restricted diffusion with ADC correlate in the left putamen, anterior greater than posterior, and left caudate head (series 2, images 24-33), consistent with acute infarcts. Additional small cortical infarct more superior left frontal lobe (series 2, image 39). No acute hemorrhage, mass, mass effect, or midline shift. No hydrocephalus or extra-axial collection. Pituitary and craniocervical junction within normal limits. T2 hyperintense signal in the periventricular white matter, likely the sequela of moderate to severe chronic small vessel ischemic disease. Vascular: The left MCA flow void is present but appears diminutive compared to the right. Otherwise normal arterial flow voids. Skull and upper cervical spine: Normal marrow signal. Sinuses/Orbits: Clear paranasal sinuses. No acute finding in the orbits. Status post bilateral lens replacements. Other: The mastoid air cells are well aerated. IMPRESSION: 1. Acute infarcts in the left putamen, anterior greater than posterior, and left caudate head. Additional small cortical infarct in the more superior left frontal lobe. 2. The left MCA flow void is present but appears diminutive compared to the right. Electronically Signed   By: Wiliam Ke M.D.   On: 06/11/2023 02:09   CT ANGIO HEAD NECK W WO CM W PERF (CODE STROKE)  Result Date: 06/10/2023 CLINICAL DATA:  89 year old  female code stroke presentation with plain head CT evidence of left MCA ELVO. EXAM: CT ANGIOGRAPHY HEAD AND NECK CT PERFUSION BRAIN TECHNIQUE: Multidetector CT imaging of the head and neck was performed using the standard protocol during bolus administration of intravenous contrast. Multiplanar CT image reconstructions and MIPs were obtained to evaluate the vascular anatomy. Carotid stenosis measurements (when applicable) are obtained utilizing NASCET criteria, using the distal internal carotid diameter as the denominator. Multiphase CT imaging of the brain was performed following IV bolus contrast injection. Subsequent parametric perfusion maps were calculated using RAPID software. RADIATION DOSE REDUCTION: This exam was performed according to the departmental dose-optimization program which includes automated exposure control, adjustment of the mA and/or kV according to patient size and/or use of iterative reconstruction technique. CONTRAST:  OMNIPAQUE IOHEXOL 350 MG/ML SOLN COMPARISON:  Plain head CT 1212  hours today. FINDINGS: CT Brain Perfusion Findings: ASPECTS: 7 CBF (<30%) Volume: 7mL (up to 13 mL using the least stringent parameters). Perfusion (Tmax>6.0s) volume: 87mL. Hypoperfusion index of 0.4 (34 mL of T-max greater than 10 seconds) Mismatch Volume: 80mL Infarction Location:Left MCA CTA NECK Skeleton: Previous sternotomy. Age-appropriate cervical spine degeneration. No acute osseous abnormality identified. Upper chest: Prior sternotomy. Mild upper lung atelectasis and gas trapping. Other neck: Partially retropharyngeal course of both carotids, otherwise negative for age. Aortic arch: 3 vessel arch. Mild arch tortuosity. Calcified aortic atherosclerosis. Right carotid system: Brachiocephalic artery is mildly tortuous without stenosis. Mildly tortuous right CCA with a retropharyngeal course. Bulky calcified plaque at the right ICA origin and bulb, up to 50 % stenosis with respect to the distal  vessel. Tortuous right ICA distal to the bulb. Left carotid system: Tortuous left CCA with no plaque or stenosis. Retropharyngeal course distally. Mild to moderate calcified plaque at the left ICA origin and bulb with 50 % stenosis with respect to the distal vessel (series 11, image 84). Mildly tortuous left ICA distal to the bulb. Vertebral arteries: Calcified right subclavian artery origin and tortuosity without stenosis. Normal right vertebral artery origin. Tortuous right vertebral artery in the neck with no plaque or stenosis to the skull base. Proximal left subclavian artery mild plaque without stenosis. Normal left vertebral artery origin. Tortuous left vertebral artery in the neck with no plaque or stenosis to the skull base. CTA HEAD Posterior circulation: Mild bilateral V4 segment atherosclerosis with no significant stenosis. Patent vertebrobasilar junction. Normal right PICA origin. Left AICA appears to be dominant. Patent basilar artery without stenosis. Patent SCA and PCA origins. Small posterior communicating artery(s). Bilateral PCA branches are within normal limits. Anterior circulation: Both ICA siphons are patent. On the left there is mild to moderate calcified plaque primarily from the anterior genu distally. Only mild left siphon stenosis. On the right there is more moderate calcified plaque, and there is moderate to severe right supraclinoid stenosis suspected on series 9, image 103 and series 13, image 64. Patent carotid termini. Patent MCA and ACA origins. Normal anterior communicating artery. Bilateral ACA branches are within normal limits. Right MCA M1 segment and right MCA branches are within normal limits. Left MCA M1 is occluded distal to the temporal artery origins series 7, image 24. Very little early left MCA branch reconstitution. Venous sinuses: Early contrast timing, not evaluated. Anatomic variants: None significant. Review of the MIP images confirms the above findings IMPRESSION:  1. Positive for Left MCA M1 Occlusion. This was communicated to Dr. Derry Lory at 1217 hours via the [facility] messaging system. 2. CTP estimates a 7 mL infarct core concordant with ASPECTS. 87 mL estimated penumbra. Hypoperfusion index of 0.4. 3. Generalized arterial tortuosity in the head and neck. Bilateral carotid atherosclerosis with up to 50% stenosis on the Left. Where as distal Right ICA siphon stenosis appears moderate to severe. No significant posterior circulation stenosis. 4.  Aortic Atherosclerosis (ICD10-I70.0). Electronically Signed   By: Odessa Fleming M.D.   On: 06/10/2023 12:30   CT HEAD CODE STROKE WO CONTRAST  Result Date: 06/10/2023 CLINICAL DATA:  Code stroke.  89 year old female. EXAM: CT HEAD WITHOUT CONTRAST TECHNIQUE: Contiguous axial images were obtained from the base of the skull through the vertex without intravenous contrast. RADIATION DOSE REDUCTION: This exam was performed according to the departmental dose-optimization program which includes automated exposure control, adjustment of the mA and/or kV according to patient size and/or use of iterative reconstruction technique. COMPARISON:  Head CT 10/30/2013. FINDINGS: Brain: Patchy asymmetric bilateral white matter and deep gray matter hypodensity slightly greater in the left hemisphere. But furthermore, there is suspicious asymmetric loss of left basal ganglia gray-white differentiation on series 2, image 14. Posterior left insula also indistinct. Other left MCA territory gray-white appears preserved. No midline shift, mass effect, or evidence of intracranial mass lesion. No ventriculomegaly. No acute intracranial hemorrhage identified. Vascular: Asymmetric hyperdensity of the left MCA distal M1 on sagittal image 31. Skull: No acute osseous abnormality identified. Sinuses/Orbits: Paranasal sinuses, middle ears and mastoids remain well aerated, mild. Mild retained secretions in the right ethmoid and maxillary sinuses. Other: Rightward gaze  deviation.  Stable scalp soft tissues. ASPECTS [facility] Stroke Program Early CT Score) Total score (0-10 with 10 being normal): 7 IMPRESSION: 1. Hyperdense Left MCA and evidence of Left basal ganglia cytotoxic edema. ASPECTS 7. No acute hemorrhage identified. These results were communicated to Dr. Derry Lory at 12:16 pm on 06/10/2023 by text page via the [facility] messaging system. 2. Underlying bilateral small vessel disease. Electronically Signed   By: Odessa Fleming M.D.   On: 06/10/2023 12:17    Labs:  Basic Metabolic Panel: No results for input(s): "NA", "K", "CL", "CO2", "GLUCOSE", "BUN", "CREATININE", "CALCIUM", "MG", "PHOS" in the last 168 hours.   CBC: No results for input(s): "WBC", "NEUTROABS", "HGB", "HCT", "MCV", "PLT" in the last 168 hours.   CBG: No results for input(s): "GLUCAP" in the last 168 hours.  Family history.  Mother with CAD as well as father.  Denies any colon cancer esophageal cancer or rectal cancer  Brief HPI:   Sara Donaldson is a 89 y.o. right-handed female with history significant for hypertension, obesity with BMI 27.33, tachycardia, anxiety, aortic stenosis with aortic valve replacement 03/02/2024 maintained on aspirin daily.  Per chart review patient lives alone independent prior to admission.  She has excellent support in the area.  Presented 06/10/2023 after being found down by her daughter with right sided weakness as well as right gaze preference with dysarthria.  Admission chemistries unremarkable except WBC 14,000 glucose 153 hemoglobin A1c 5.4.  Cranial CT scan showed hyperdense left MCA and evidence of left basal ganglia cytotoxic edema.  No acute hemorrhage identified.  CTA of head and neck positive for left MCA M1 occlusion.  Generalized arterial tortuosity in the head and neck.  Bilateral carotid atherosclerosis with up to 50% stenosis on the left.  No significant posterior circulation stenosis.  Underwent thrombectomy of left M1 occlusion per interventional radiology  11/24.  MRI follow-up 06/11/2023 shows acute infarction in the left putamen, anterior greater than posterior and left caudate head.  Additional small cortical infarct in the more superior left frontal lobe.  Echocardiogram with ejection fraction of 55 to 60% no wall motion abnormalities.  Hospital course cardiology consulted 11/26 after telemetry monitor showed atrial fibrillation with moderate ventricular response.  She was placed on Cardizem as well as low-dose beta-blocker.  She remained on low-dose aspirin with the addition of Eliquis.  Tolerating a regular diet.  Plan for lifelong Eliquis treatment.  Therapy evaluations completed due to patient decreased functional ability right-sided weakness was admitted for a comprehensive rehab program.   Hospital Course: Sara Donaldson was admitted to rehab 06/13/2023 for inpatient therapies to consist of PT, ST and OT at least three hours five days a week. Past admission physiatrist, therapy team and rehab RN have worked together to provide customized collaborative inpatient rehab.  Pertaining to patient's left MCA infarction with  left MCA M1 occlusion status post thrombectomy.  Patient would follow-up with interventional radiology as well as neurology.  Maintained on Eliquis/aspirin for CVA prophylaxis as well as atrial fibrillation with cardiac rate controlled.  Lopressor and Cardizem as indicated.  No chest pain or shortness of breath.  Pravachol ongoing for hyperlipidemia.  Blood pressure monitored will need outpatient follow-up.  Noted obesity BMI 26.70 with dietary follow-up.  She did have bouts of anxiety Xanax as needed with emotional support provided.   Blood pressures were monitored on TID basis and soft and monitored     Rehab course: During patient's stay in rehab weekly team conferences were held to monitor patient's progress, set goals and discuss barriers to discharge. At admission, patient required minimal assist 12 feet 1 person hand-held assist  minimal assist step pivot transfers  Physical exam.  Blood pressure 151/75 pulse 94 temperature 98.5 respirations 18 oxygen saturation 95% room air Constitutional.  No acute distress HEENT Head.  Normocephalic and atraumatic Eyes.  Pupils round and reactive to light no discharge without nystagmus Neck.  Supple nontender no JVD without thyromegaly Cardiac regular rate and rhythm without any extra sounds or murmur heard Abdomen.  Soft nontender positive bowel sounds without rebound Respiratory effort normal no respiratory distress without wheeze Neurologic.  Alert and oriented remembers 2/3 words 5 minutes later.  Speech dysarthric but intelligible follows commands Right upper quadrantanopia Mild right facial weakness 5/5 head turn 5/5 shoulder shrug bilaterally Motor.  Right upper extremity 4 -/5 deltoid/5 biceps 4/5 tricep 4/5 grip Left upper extremity 5/5 deltoid 5/5 biceps 5/5 triceps 5/5 grip Right lower extremity hip flexors 4 -/5 knee extension 4+/5 ADF 4/5 APF 4/5 Left lower extremity hip flexors 5/5 knee extension 5/5 dorsiflexion 5/5 APF 5/5  He/She  has had improvement in activity tolerance, balance, postural control as well as ability to compensate for deficits. He/She has had improvement in functional use RUE/LUE  and RLE/LLE as well as improvement in awareness.  Ambulating 160 feet x 2 without assistive device/contact-guard.  Dynamic gait training with dual task challenges to promote head rotation and sudden starts/stops and turning to locate number discs in ascending order.  Contact-guard for stairs.  Sit to stand without assist device contact-guard.  Patient completes oral care standing at sink side.  Showered on the bench but needed some cues to stay seated.  Supervision upper body dressing and lower body dressing.  SLP follow-up reviewing orientation and awareness questions.  Patient oriented x 4 given supervision assist.  Full family teaching completed plan discharge to  home       Disposition: Discharge to home    Diet: Carb modified  Special Instructions: No driving smoking or alcohol  Medications at discharge. 1.  Tylenol as needed 2.  Xanax 0.25 mg nightly  3.  Eliquis 2.5 mg p.o. twice daily 4.  Aspirin 81 mg p.o. daily 5.  Cardizem CD 300 mg p.o. daily 6.  Melatonin 3 mg nightly as needed sleep 7.  Lopressor 50 mg p.o. twice daily 8.  Pravachol 40 mg p.o. daily 9.  Cozaar 100 mg daily 10.  Multivitamin daily 30-35 minutes were spent completing discharge summary and discharge planning  Discharge Instructions     Ambulatory referral to Neurology   Complete by: As directed    An appointment is requested in approximately: 4 weeks left MCA infarction   Ambulatory referral to Physical Medicine Rehab   Complete by: As directed    Moderate complexity follow up 1-2  weeks Left MCA infarct        Follow-up Information     Orpah Cobb, MD. Schedule an appointment as soon as possible for a visit in 2 week(s).   Specialty: Cardiology Why: Earlier as needed otherwise 1-2 weeks. Contact information: [address] [place] [address] [phone]         Erick Colace, MD Follow up.   Specialty: Physical Medicine and Rehabilitation Why: Office to call for appointment Contact information: [address] [place] [address] [place] [address] [phone]                 Signed: Charlton Amor 06/22/2023, 5:08 AM

## 2023-06-17 NOTE — Discharge Instructions (Signed)
STROKE/TIA DISCHARGE INSTRUCTIONS SMOKING Cigarette smoking nearly doubles your risk of having a stroke & is the single most alterable risk factor  If you smoke or have smoked in the last 12 months, you are advised to quit smoking for your health. Most of the excess cardiovascular risk related to smoking disappears within a year of stopping. Ask you doctor about anti-smoking medications [place] Quit Line: 1-800-QUIT NOW Free Smoking Cessation Classes [phone]  CHOLESTEROL Know your levels; limit fat & cholesterol in your diet  Lipid Panel     Component Value Date/Time   CHOL 117 06/11/2023 0538   TRIG 75 06/11/2023 0538   HDL 52 06/11/2023 0538   CHOLHDL 2.3 06/11/2023 0538   VLDL 15 06/11/2023 0538   LDLCALC 50 06/11/2023 0538     Many patients benefit from treatment even if their cholesterol is at goal. Goal: Total Cholesterol (CHOL) less than 160 Goal:  Triglycerides (TRIG) less than 150 Goal:  HDL greater than 40 Goal:  LDL (LDLCALC) less than 100   BLOOD PRESSURE American Stroke Association blood pressure target is less that 120/80 mm/Hg  Your discharge blood pressure is:  BP: (!) 159/69 Monitor your blood pressure Limit your salt and alcohol intake Many individuals will require more than one medication for high blood pressure  DIABETES (A1c is a blood sugar average for last 3 months) Goal HGBA1c is under 7% (HBGA1c is blood sugar average for last 3 months)  Diabetes: No known diagnosis of diabetes    Lab Results  Component Value Date   HGBA1C 5.4 06/10/2023    Your HGBA1c can be lowered with medications, healthy diet, and exercise. Check your blood sugar as directed by your physician Call your physician if you experience unexplained or low blood sugars.  PHYSICAL ACTIVITY/REHABILITATION Goal is 30 minutes at least 4 days per week  Activity: Increase activity slowly, Therapies: Physical Therapy: Home Health Return to work:  Activity decreases your risk of heart attack  and stroke and makes your heart stronger.  It helps control your weight and blood pressure; helps you relax and can improve your mood. Participate in a regular exercise program. Talk with your doctor about the best form of exercise for you (dancing, walking, swimming, cycling).  DIET/WEIGHT Goal is to maintain a healthy weight  Your discharge diet is:  Diet Order             Diet heart healthy/carb modified Room service appropriate? Yes with Assist; Fluid consistency: Thin  Diet effective now                   liquids Your height is:  Height: 5\' 1"  (154.9 cm) Your current weight is: Weight: 64.1 kg Your Body Mass Index (BMI) is:  BMI (Calculated): 26.72 Following the type of diet specifically designed for you will help prevent another stroke. Your goal weight range is:   Your goal Body Mass Index (BMI) is 19-24. Healthy food habits can help reduce 3 risk factors for stroke:  High cholesterol, hypertension, and excess weight.  RESOURCES Stroke/Support Group:  Call [phone]   STROKE EDUCATION PROVIDED/REVIEWED AND GIVEN TO PATIENT Stroke warning signs and symptoms How to activate emergency medical system (call 911). Medications prescribed at discharge. Need for follow-up after discharge. Personal risk factors for stroke. Pneumonia vaccine given: No Flu vaccine given: No My questions have been answered, the writing is legible, and I understand these instructions.  I will adhere to these goals & educational materials that have  been provided to me after my discharge from the hospital.   Inpatient Rehab Discharge Instructions  Sara Donaldson Discharge date and time: No discharge date for patient encounter.   Activities/Precautions/ Functional Status: Activity: As tolerated Diet: Carb modified Wound Care: Routine skin checks Functional status:  ___ No restrictions     ___ Walk up steps independently ___ 24/7 supervision/assistance   ___ Walk up steps with assistance ___  Intermittent supervision/assistance  ___ Bathe/dress independently ___ Walk with walker     _x__ Bathe/dress with assistance ___ Walk Independently    ___ Shower independently ___ Walk with assistance    ___ Shower with assistance ___ No alcohol     ___ Return to work/school ________  Special Instructions: No driving smoking or alcohol   My questions have been answered and I understand these instructions. I will adhere to these goals and the provided educational materials after my discharge from the hospital.  Patient/Caregiver Signature _______________________________ Date __________  Clinician Signature _______________________________________ Date __________  Please bring this form and your medication list with you to all your follow-up doctor's appointments.

## 2023-06-18 DIAGNOSIS — I10 Essential (primary) hypertension: Secondary | ICD-10-CM | POA: Diagnosis not present

## 2023-06-18 DIAGNOSIS — Z952 Presence of prosthetic heart valve: Secondary | ICD-10-CM

## 2023-06-18 DIAGNOSIS — I482 Chronic atrial fibrillation, unspecified: Secondary | ICD-10-CM | POA: Diagnosis not present

## 2023-06-18 DIAGNOSIS — I63512 Cerebral infarction due to unspecified occlusion or stenosis of left middle cerebral artery: Secondary | ICD-10-CM | POA: Diagnosis not present

## 2023-06-18 MED ORDER — DILTIAZEM HCL ER COATED BEADS 300 MG PO CP24
300.0000 mg | ORAL_CAPSULE | Freq: Every day | ORAL | Status: DC
  Administered 2023-06-19 – 2023-06-22 (×4): 300 mg via ORAL
  Filled 2023-06-18 (×4): qty 1

## 2023-06-18 NOTE — Progress Notes (Signed)
Physical Therapy Session Note  Patient Details  Name: Sara Donaldson MRN: [id number] Date of Birth: 1930-03-19  Today's Date: 06/18/2023 PT Individual Time: 1031-1129 PT Individual Time Calculation (min): 58 min   Short Term Goals: Week 1:  PT Short Term Goal 1 (Week 1): STG = LTG d/t ELOS  Skilled Therapeutic Interventions/Progress Updates:  Patient supine in bed on entrance to room. Patient alert and agreeable to PT session. Relates a good weekend with a good night's sleep.   Patient with no pain complaint at start of session.  Therapeutic Activity: Bed Mobility: Pt performed supine <> sit with supervision and extra time/ effort. No vc requiredfor technique. Transfers: Pt performed sit<>stand and stand pivot transfers throughout session with supervision using push-to-stand technique. No vc required.  Gait Training:  Pt ambulated 185' with RW initially and then progressed to no AD use on return trip to room. Demonstrated good, consistent pace with slight ER of LLE. Provided vc/ tc for energy conservation.   Neuromuscular Re-ed: NMR facilitated during session with focus on standing balance and motor control. Pt guided in toss of beanbags to target board Initially pt tosses with RUE with disc-throwing techniquebut self-chooses to change to underhand toss with improve results. Maintains balance throughout dynamic stepping. Progressed to perform while standing on Airex and with addition of 1.5# aw on RUE. No LOB and continued good performance.   Next, pt guided in removal of Squigz placed on mirror positioned 4 ft in front of pt. Requires dynamic step with instructions to switch LE used. No LOB and good performance throughout to removed called color.   NMR performed for improvements in motor control and coordination, balance, sequencing, judgement, and self confidence/ efficacy in performing all aspects of mobility at highest level of independence.   Patient seated upright in recliner at  end of session with brakes locked, belt alarm set, and all needs within reach. Oriented to time and time of next therapy session.   Therapy Documentation Precautions:  Precautions Precautions: Fall Precaution Comments: mild R inattention and hemipareisis Restrictions Weight Bearing Restrictions: No  Pain: Pain Assessment Pain Scale: 0-10 Pain Score: 0-No pain   Therapy/Group: Individual Therapy  Loel Dubonnet PT, DPT, CSRS  06/18/2023, 11:30 AM

## 2023-06-18 NOTE — Progress Notes (Signed)
Patient ID: Sara Donaldson, female   DOB: [birth year], 89 y.o.   MRN: [id number] Met with the patient to review current medical status, rehab schedule, team conference and plan of care. Discussed secondary risk management including HTN, HLD and A-fib. On Eliquis and ASA. Patient noted continues plan for home along; dtr works but can come over anytime. Patient notes no concern over being solo however concern about medication management and safety for discharge.  Patient continent of bowel and bladder and occ. Tylenol prn.  Continue to follow along to address educational needs to facilitate preparation for discharge. Pamelia Hoit

## 2023-06-18 NOTE — Plan of Care (Signed)
  Problem: RH BOWEL ELIMINATION Goal: RH STG MANAGE BOWEL WITH ASSISTANCE Description: STG Manage Bowel with min Assistance. Outcome: Progressing   Problem: RH BLADDER ELIMINATION Goal: RH STG MANAGE BLADDER WITH ASSISTANCE Description: STG Manage Bladder With min Assistance Outcome: Progressing   Problem: RH SKIN INTEGRITY Goal: RH STG SKIN FREE OF INFECTION/BREAKDOWN Description: Incision will remain intact and free of infection/breakdown with min assist  Outcome: Progressing Goal: RH STG ABLE TO PERFORM INCISION/WOUND CARE W/ASSISTANCE Description: STG Able To Perform Incision/Wound Care With min Assistance. Outcome: Progressing   Problem: RH SAFETY Goal: RH STG ADHERE TO SAFETY PRECAUTIONS W/ASSISTANCE/DEVICE Description: STG Adhere to Safety Precautions With min Assistance/Device. Outcome: Progressing Goal: RH STG DECREASED RISK OF FALL WITH ASSISTANCE Description: STG Decreased Risk of Fall With min Assistance. Outcome: Progressing   Problem: RH PAIN MANAGEMENT Goal: RH STG PAIN MANAGED AT OR BELOW PT'S PAIN GOAL Description: Less than 4 with PRN medications Outcome: Progressing   Problem: RH KNOWLEDGE DEFICIT Goal: RH STG INCREASE KNOWLEDGE OF STROKE PROPHYLAXIS Description: Patient/caregiver will be able to manage new medications related to stroke and Afib from nursing education, nursing handouts and other resources independently  Outcome: Progressing

## 2023-06-18 NOTE — Progress Notes (Signed)
Occupational Therapy Session Note  Patient Details  Name: Sara Donaldson MRN: [id number] Date of Birth: 1930-02-18  Today's Date: 06/18/2023 OT Individual Time: 4098-1191 OT Individual Time Calculation (min): 59 min  And  OT Individual Time: 1300-1344 OT Individual Time Calculation (min): 44 min   Short Term Goals: Week 1:  OT Short Term Goal 1 (Week 1): STG=LTG  Skilled Therapeutic Interventions/Progress Updates:     AM Session: Pt received lightly sleeping in recliner waking upon OT arrival. Pt presenting to be in good spirits receptive to skilled OT session reporting 0/10 pain- OT offering intermittent rest breaks, repositioning, and therapeutic support to optimize participation in therapy session. Focus this session BADL retraining, dynamic balance, and R UE functional use.   Pt requesting to use restroom at beginning of session. Pt completed functional mobility no AD ~20 ft to BR with min HHA provided for balance. Pt able to complete 3/3 toileting tasks with CGA provided for balance following continent void with anterior peri-care completed in seated position. Pt then completed functional mobility to sink no AD and stood to wash hands and brush teeth with light min A provided for dynamic standing balance.   U/LB dressing completed from recliner with re-education provided on hemi-dressing techniques to support learning and carryover. Pt with increased challenged reaching towards ground to weave R LE. Education provided on modified technique of crossing R LE into figure-four position with improved success weaving B LEs using this technique. Pt stood to bring pants to waist with min a required for balance. Pt doff/donned shirt with supervision.   Pt completed functional mobility to therapy gym no AD for endurance and strength training with min verbal cues required for R arm swing and min A for balance.  Pt completed dynamic standing balance activity without AD while standing on compliant  surface with R [facility] skills ans functional reaching incorporated into activity to work on dynamic standing balance, overhead and cross midline reaching, R UE functional use, and activity tolerance. Pt instructed to maintain balance while reaching across midline to retrieve squigz and then place squigz onto vertical mirror anterior to Pt with min A required for balance and mod tactile/verbal cues for anterior weight shifting d/t Pt presenting with posterior lean. Seated rest break provided. Pt then reverse task to doff squigz from mirror in standing with Pt able to complete with min A for balance and no dropping noted.   Engaged Pt in dynamic standing balance bean bag toss activity without AD to address Pt's standing balance deficits and righting reactions. Incorporated posterior reaching and blocked practice of sit<>stands into activity to simulate skills required for IADLs and BADLs. Pt completed 10x2 trials with CGA to light min A provided for balance and tactile cues provided for anterior weight shifting d/t posterior lean.  Pt completed functional mobility back to her room no AD with light min A provided for balance. Pt was left resting in bed with call bell in reach, bed alarm on, and all needs met.    PM Session:  Pt received resting in bed presenting to be in good spirits receptive to skilled OT session reporting 0/10 pain- OT offering intermittent rest breaks, repositioning, and therapeutic support to optimize participation in therapy session. Focus this session activity tolerance, dynamic balance, and R UE NMRE.   Pt requesting to use BR at beginning of therapy session. Pt completed functional mobility to BR no AD CGA and completed 3/3 toileting tasks following continent void CGA without LOB. Pt stood at  sink to wash hands close supervision.   Functional mobility training to therapy gym ~166ft no AD CGA for safety with improved R arm swing noted this trial. Seated rest break provided following d/t  Pt reporting fatigue.   Engaged Pt in dynamic stepping activity with vertical mirror placed anterior to Pt for increased visual feedback for postural alignment to simulate stepping over curbs or over threshold into shower. Focused intervention on lateral weight shifting, foot height, and dynamic balance. Pt completed 2x10 reps R/L of single legs step taps onto cone and 2x10 reps of alternating step taps to cone with min HHA provided for balance and mod tactile cues provided for weight shifting.   Engaged Pt in [facility] picture replication activity using small pegs to facilitate pincer grasp, lateral pinch, and translation skills required for BADLs. Pt able to complete activity with 100% accuracy in seated position with min dropping noted. Pt then utilized tweezers to remove pegs for increased pad to pad pinch challenge with Pt able to complete without dropping.   Pt completed functional mobility back to room not AD with CGA and returned to bed supervision. Pt was left resting in bed with call bell in reach, bed alarm on, and all needs met.    Therapy Documentation Precautions:  Precautions Precautions: Fall Precaution Comments: mild R inattention and hemipareisis Restrictions Weight Bearing Restrictions: No   Therapy/Group: Individual Therapy  Clide Deutscher 06/18/2023, 7:50 AM

## 2023-06-18 NOTE — Progress Notes (Signed)
PROGRESS NOTE   Subjective/Complaints:   ROS: Patient denies CP, SOB, N/V/D   Objective:   No results found. No results for input(s): "WBC", "HGB", "HCT", "PLT" in the last 72 hours.  No results for input(s): "NA", "K", "CL", "CO2", "GLUCOSE", "BUN", "CREATININE", "CALCIUM" in the last 72 hours.   Intake/Output Summary (Last 24 hours) at 06/18/2023 0736 Last data filed at 06/18/2023 0721 Gross per 24 hour  Intake 467 ml  Output --  Net 467 ml        Physical Exam: Vital Signs Blood pressure (!) 159/69, pulse 78, temperature 97.9 F (36.6 C), temperature source Oral, resp. rate 18, height 5\' 1"  (1.549 m), weight 64.1 kg, SpO2 95%.  General: No acute distress Mood and affect are appropriate Heart: Regular rate and rhythm no rubs murmurs or extra sounds Lungs: Clear to auscultation, breathing unlabored, no rales or wheezes Abdomen: Positive bowel sounds, soft nontender to palpation, nondistended Extremities: No clubbing, cyanosis, or edema Skin: No evidence of breakdown, no evidence of rash Neurologic: speech dysarthric , mild anomia,  motor strength is 5/5 in left and 4+/5 Right deltoid, bicep, tricep, grip,5/5 , 4+ Bilateral  hip flexor, knee extensors, ankle dorsiflexor and plantar flexor Sensory exam normal sensation to light touch  in bilateral upper and lower extremities Cerebellar exam normal finger to nose to finger as well as heel to shin in bilateral upper and lower extremities Musculoskeletal: Full range of motion in all 4 extremities. No joint swelling   Assessment/Plan: 1. Functional deficits which require 3+ hours per day of interdisciplinary therapy in a comprehensive inpatient rehab setting. Physiatrist is providing close team supervision and 24 hour management of active medical problems listed below. Physiatrist and rehab team continue to assess barriers to discharge/monitor patient progress toward  functional and medical goals  Care Tool:  Bathing    Body parts bathed by patient: Right arm, Left arm, Chest, Abdomen, Front perineal area, Buttocks, Right upper leg, Left upper leg, Right lower leg, Left lower leg, Face         Bathing assist Assist Level: Contact Guard/Touching assist     Upper Body Dressing/Undressing Upper body dressing   What is the patient wearing?: Pull over shirt    Upper body assist Assist Level: Contact Guard/Touching assist    Lower Body Dressing/Undressing Lower body dressing      What is the patient wearing?: Underwear/pull up, Pants     Lower body assist Assist for lower body dressing: Minimal Assistance - Patient > 75%     Toileting Toileting    Toileting assist Assist for toileting: Contact Guard/Touching assist     Transfers Chair/bed transfer  Transfers assist     Chair/bed transfer assist level: Minimal Assistance - Patient > 75%     Locomotion Ambulation   Ambulation assist      Assist level: Contact Guard/Touching assist Assistive device: Walker-rolling Max distance: 130 ft   Walk 10 feet activity   Assist     Assist level: Minimal Assistance - Patient > 75% Assistive device: No Device   Walk 50 feet activity   Assist    Assist level: Contact Guard/Touching assist Assistive device: Walker-rolling  Walk 150 feet activity   Assist    Assist level: Contact Guard/Touching assist Assistive device: Walker-rolling    Walk 10 feet on uneven surface  activity   Assist Walk 10 feet on uneven surfaces activity did not occur: Safety/medical concerns         Wheelchair     Assist Is the patient using a wheelchair?: Yes Type of Wheelchair: Manual    Wheelchair assist level: Dependent - Patient 0% Max wheelchair distance: 150 ft    Wheelchair 50 feet with 2 turns activity    Assist        Assist Level: Dependent - Patient 0%   Wheelchair 150 feet activity     Assist       Assist Level: Dependent - Patient 0%   Blood pressure (!) 159/69, pulse 78, temperature 97.9 F (36.6 C), temperature source Oral, resp. rate 18, height 5\' 1"  (1.549 m), weight 64.1 kg, SpO2 95%.  Medical Problem List and Plan: 1. Functional deficits secondary to left MCA infarct with left MCA M1 occlusion status post thrombectomy- mild aphasia , mild LUE weakness              -patient may  shower             -ELOS/Goals: 10-14 days, Mod I to sup with PT/OT/SLP             -Continue CIR therapies including PT, OT, and SLP 2.  Antithrombotics: -DVT/anticoagulation:  Pharmaceutical: Eliquis 2.5 mg twice daily, Eliquis lifelong             -antiplatelet therapy: Aspirin 81 mg daily 3. Pain Management: Tylenol as needed 4. Mood/Behavior/Sleep: Xanax 0.25 mg nightly as needed             -antipsychotic agents: N/A  -11/30 Melatonin PRN insomnia   12/1 insomnia improved continue current regimen 5. Neuropsych/cognition: This patient is capable of making decisions on her own behalf. 6. Skin/Wound Care: Routine skin checks 7. Fluids/Electrolytes/Nutrition: encourage PO  I personally reviewed the patient's labs today.  WNL  -protein supp for low albumin 8.  Atrial fibrillation as well as history of aortic valve repair.  .  Cardiac rate controlled.  Continue Eliquis as directed             -Consider clarifying with cardiology plan for once a day diltiazem?  -11/29 HR in 80's to 90's   -cardiology changed to diltiazem CD 240mg  qd and increased metoprolol to 50mg  bid   Appreciate cardiology assist 9.  Hyperlipidemia.  Pravachol.  No high intensity statin due to LDL at goal. Heart healthy diet 10.  Hypertension.  Long-term goal normotensive.  Home medications amlodipine 5 mg(d/c ed), Lasix 20 mg (d/c ed) , hydralazine 25 mg (d/ced) , lisinopril 5 mg (d/c ed), losartan 100 mg             -11/29- metoprolol increased as above.   -observe today, may need further titration  -12/1 intermittently  elevated but overall controlled.  Continue current medications     06/18/2023    4:19 AM 06/17/2023    7:19 PM 06/17/2023    3:46 AM  Vitals with BMI  Systolic 159 127 295  Diastolic 69 67 97  Pulse 78 92 82  On Losartan 100mg  every day restarted per cardiology on 12/1     LOS: 5 days A FACE TO FACE EVALUATION WAS PERFORMED  Erick Colace 06/18/2023, 7:36 AM

## 2023-06-18 NOTE — Progress Notes (Signed)
Speech Language Pathology Daily Session Note  Patient Details  Name: CAITY VICTORIAN MRN: [id number] Date of Birth: October 06, 1929  Today's Date: 06/18/2023 SLP Individual Time: 1345-1430 SLP Individual Time Calculation (min): 45 min  Short Term Goals: Week 1: SLP Short Term Goal 1 (Week 1): STGs=LTGs d/t ELOS  Skilled Therapeutic Interventions: SLP conducted skilled therapy session targeting cognitive retraining goals. Patient agreeable to participate in therapy activities. During all tasks, patient sustained attention without difficulty. Required min-mod assist to recall events from earlier therapy sessions. During basic financial problem solving tasks, patient required mod assist to add money in typical conventions but after expectations were established, completed activity with overall min assist. During medication error identification tasks, patient required min assist to systematically approach task. Patient's daughter arrived at end of session and requested SLP discuss patient progress thus far. Provided education to family member as well as expectations for patient's level of assistance upon discharge. Patient was left in lowered bed with call bell in reach and bed alarm set. SLP will continue to target goals per plan of care.       Pain Pain Assessment Pain Scale: 0-10 Pain Score: 0-No pain  Therapy/Group: Individual Therapy  Jeannie Done, M.A., CCC-SLP  Yetta Barre 06/18/2023, 3:45 PM

## 2023-06-19 DIAGNOSIS — I482 Chronic atrial fibrillation, unspecified: Secondary | ICD-10-CM | POA: Diagnosis not present

## 2023-06-19 DIAGNOSIS — I10 Essential (primary) hypertension: Secondary | ICD-10-CM | POA: Diagnosis not present

## 2023-06-19 DIAGNOSIS — Z952 Presence of prosthetic heart valve: Secondary | ICD-10-CM | POA: Diagnosis not present

## 2023-06-19 DIAGNOSIS — I63512 Cerebral infarction due to unspecified occlusion or stenosis of left middle cerebral artery: Secondary | ICD-10-CM | POA: Diagnosis not present

## 2023-06-19 MED ORDER — MELATONIN 5 MG PO TABS
5.0000 mg | ORAL_TABLET | Freq: Every day | ORAL | Status: DC
  Administered 2023-06-19: 5 mg via ORAL
  Filled 2023-06-19: qty 1

## 2023-06-19 NOTE — Progress Notes (Signed)
Physical Therapy Session Note  Patient Details  Name: Sara Donaldson MRN: [id number] Date of Birth: [birth year]  Today's Date: 06/19/2023 PT Individual Time: 1020-1104 PT Individual Time Calculation (min): 44 min   Short Term Goals: Week 1:  PT Short Term Goal 1 (Week 1): STG = LTG d/t ELOS  Skilled Therapeutic Interventions/Progress Updates: Patient sitting in recliner on entrance to room. Patient alert and agreeable to PT session.   Patient reported no pain at beginning of PT session.   Therapeutic Activity: Bed Mobility: Pt performed sit to supine from EOB at end of session with supervision Transfers: Pt performed sit<>stand transfers throughout session with no AD and with close supervision for safety.  Gait Training:  Pt ambulated from room<main gym and around main gym loop in middle of session and back to room at end of session using no AD with CGA. Pt demonstrated the following gait deviations with therapist providing the described cuing and facilitation for improvement:  - Decreased cadence, step clearance and step length bilaterally with VC to increase height (pt with increase in cadence when cued to increase step height with mild deviation to the R) - Decrease in reciprocal arm swing (R<L) with VC to increase on R while pt ambulated around main gym loop (noted good carryover with VC at end of session when ambulating back to room)  Neuromuscular Re-ed: - Cone tapes x 12 on B LE's with minA for safety to maintain standing balance. Pt with VC to decrease excessive L lean when tapping cone with R LE vs L (pt also with decrease in coordination vs L LE). Pt then performed 2nd set of x 10 with R LE with VC to focus on tapping top of cone with toes - pt with increase in center balance with decrease in weight shift to the L which required light minA/CGA for safety.  - Multidirectional stepping (lateral to R and L and Retro). Pt required L HHA at first, then to only CGA with no LOB. Pt with  posterior weight shift when retro-stepping and was able to correct safely when provided with VC the first time (no cues required the 2nd time with good balance throughout). Pt performed task without AD.   - Pt participated in dynamic standing balance activity that utilized pt's coordination, memory recall, and 108* turns. Pt instructed to stand from edge of mat, then turn to basketball net in order remove pins, turn 180* and ambulate short distance and apply pin to edge of up (pt to pick up two pins at a time only using R UE). Pt required min cues at first to recall only using R UE. Pt performed with close supervision for safety and had no LOB throughout. Pt required increased (mild) time to coordinate R UE to place on edge of cup and net (after pt was done with all pins and they were on edge of cups, pt cued to grasp cup with R hand and ambulate back to net to remove from cup and place back on net).   NMR performed for improvements in motor control and coordination, balance, sequencing, judgement, and self confidence/ efficacy in performing all aspects of mobility at highest level of independence.   Patient supine in bed at end of session with brakes locked, bed alarm set, and all needs within reach.      Therapy Documentation Precautions:  Precautions Precautions: Fall Precaution Comments: mild R inattention and hemipareisis Restrictions Weight Bearing Restrictions: No  Therapy/Group: Individual Therapy  [name] PTA  06/19/2023, 12:24 PM

## 2023-06-19 NOTE — Progress Notes (Signed)
PROGRESS NOTE   Subjective/Complaints:  Remains mildly aphasic  Poor sleep (chronic) , denies pain, bowel or pladder issues or breathing issues keeping her awake   ROS: Patient denies CP, SOB, N/V/D   Objective:   No results found. No results for input(s): "WBC", "HGB", "HCT", "PLT" in the last 72 hours.  No results for input(s): "NA", "K", "CL", "CO2", "GLUCOSE", "BUN", "CREATININE", "CALCIUM" in the last 72 hours.   Intake/Output Summary (Last 24 hours) at 06/19/2023 0807 Last data filed at 06/19/2023 0700 Gross per 24 hour  Intake 880 ml  Output --  Net 880 ml        Physical Exam: Vital Signs Blood pressure (!) 148/66, pulse 79, temperature 98.3 F (36.8 C), resp. rate 16, height 5\' 1"  (1.549 m), weight 64.1 kg, SpO2 96%.  General: No acute distress Mood and affect are appropriate Heart: Regular rate and rhythm no rubs murmurs or extra sounds Lungs: Clear to auscultation, breathing unlabored, no rales or wheezes Abdomen: Positive bowel sounds, soft nontender to palpation, nondistended Extremities: No clubbing, cyanosis, or edema Skin: No evidence of breakdown, no evidence of rash Neurologic: speech dysarthric , mild anomia,  motor strength is 5/5 in left and 4+/5 Right deltoid, bicep, tricep, grip,5/5 , 4+ Bilateral  hip flexor, knee extensors, ankle dorsiflexor and plantar flexor  Musculoskeletal: Full range of motion in all 4 extremities. No joint swelling   Assessment/Plan: 1. Functional deficits which require 3+ hours per day of interdisciplinary therapy in a comprehensive inpatient rehab setting. Physiatrist is providing close team supervision and 24 hour management of active medical problems listed below. Physiatrist and rehab team continue to assess barriers to discharge/monitor patient progress toward functional and medical goals  Care Tool:  Bathing    Body parts bathed by patient: Right arm,  Left arm, Chest, Abdomen, Front perineal area, Buttocks, Right upper leg, Left upper leg, Right lower leg, Left lower leg, Face         Bathing assist Assist Level: Contact Guard/Touching assist     Upper Body Dressing/Undressing Upper body dressing   What is the patient wearing?: Pull over shirt    Upper body assist Assist Level: Contact Guard/Touching assist    Lower Body Dressing/Undressing Lower body dressing      What is the patient wearing?: Underwear/pull up, Pants     Lower body assist Assist for lower body dressing: Minimal Assistance - Patient > 75%     Toileting Toileting    Toileting assist Assist for toileting: Contact Guard/Touching assist     Transfers Chair/bed transfer  Transfers assist     Chair/bed transfer assist level: Minimal Assistance - Patient > 75%     Locomotion Ambulation   Ambulation assist      Assist level: Contact Guard/Touching assist Assistive device: Walker-rolling Max distance: 130 ft   Walk 10 feet activity   Assist     Assist level: Minimal Assistance - Patient > 75% Assistive device: No Device   Walk 50 feet activity   Assist    Assist level: Contact Guard/Touching assist Assistive device: Walker-rolling    Walk 150 feet activity   Assist    Assist level:  Contact Guard/Touching assist Assistive device: Walker-rolling    Walk 10 feet on uneven surface  activity   Assist Walk 10 feet on uneven surfaces activity did not occur: Safety/medical concerns         Wheelchair     Assist Is the patient using a wheelchair?: Yes Type of Wheelchair: Manual    Wheelchair assist level: Dependent - Patient 0% Max wheelchair distance: 150 ft    Wheelchair 50 feet with 2 turns activity    Assist        Assist Level: Dependent - Patient 0%   Wheelchair 150 feet activity     Assist      Assist Level: Dependent - Patient 0%   Blood pressure (!) 148/66, pulse 79, temperature 98.3  F (36.8 C), resp. rate 16, height 5\' 1"  (1.549 m), weight 64.1 kg, SpO2 96%.  Medical Problem List and Plan: 1. Functional deficits secondary to left MCA infarct with left MCA M1 occlusion status post thrombectomy- mild aphasia , mild LUE weakness              -patient may  shower             -ELOS/Goals: 10-14 days, Mod I to sup with PT/OT/SLP- team conference in am              -Continue CIR therapies including PT, OT, and SLP 2.  Antithrombotics: -DVT/anticoagulation:  Pharmaceutical: Eliquis 2.5 mg twice daily, Eliquis lifelong             -antiplatelet therapy: Aspirin 81 mg daily 3. Pain Management: Tylenol as needed 4. Mood/Behavior/Sleep: Xanax 0.25 mg nightly as needed             -antipsychotic agents: N/A  -11/30 Melatonin PRN insomnia   12/1 insomnia improved continue current regimen 5. Neuropsych/cognition: This patient is capable of making decisions on her own behalf. 6. Skin/Wound Care: Routine skin checks 7. Fluids/Electrolytes/Nutrition: encourage PO  I personally reviewed the patient's labs today.  WNL  -protein supp for low albumin 8.  Atrial fibrillation as well as history of aortic valve repair.  .  Cardiac rate controlled.  Continue Eliquis as directed             -Consider clarifying with cardiology plan for once a day diltiazem?  -11/29 HR in 80's to 90's   -cardiology changed to diltiazem CD 240mg  qd and increased metoprolol to 50mg  bid   Appreciate cardiology assist 9.  Hyperlipidemia.  Pravachol.  No high intensity statin due to LDL at goal. Heart healthy diet 10.  Hypertension.  Long-term goal normotensive.  Home medications amlodipine 5 mg(d/c ed), Lasix 20 mg (d/c ed) , hydralazine 25 mg (d/ced) , lisinopril 5 mg (d/c ed), losartan 100 mg                06/19/2023    4:44 AM 06/18/2023    7:39 PM 06/18/2023   12:30 PM  Vitals with BMI  Systolic 148 126 841  Diastolic 66 67 70  Pulse 79 76 73   Losartan 100mg  every day restarted per cardiology on  12/1 Cardizem CD 300mg  every day Metoprolol 50mg  BID   11.  Insomnia- chronic schedule melatonin 5mg  at bedtime, also has prn alprazolam available but has not taken  LOS: 6 days A FACE TO FACE EVALUATION WAS PERFORMED  Erick Colace 06/19/2023, 8:07 AM

## 2023-06-19 NOTE — Plan of Care (Signed)
  Problem: Consults Goal: RH STROKE PATIENT EDUCATION Description: See Patient Education module for education specifics  Outcome: Progressing   Problem: RH BOWEL ELIMINATION Goal: RH STG MANAGE BOWEL WITH ASSISTANCE Description: STG Manage Bowel with min Assistance. Outcome: Progressing   Problem: RH BLADDER ELIMINATION Goal: RH STG MANAGE BLADDER WITH ASSISTANCE Description: STG Manage Bladder With min Assistance Outcome: Progressing   Problem: RH SKIN INTEGRITY Goal: RH STG SKIN FREE OF INFECTION/BREAKDOWN Description: Incision will remain intact and free of infection/breakdown with min assist  Outcome: Progressing Goal: RH STG ABLE TO PERFORM INCISION/WOUND CARE W/ASSISTANCE Description: STG Able To Perform Incision/Wound Care With min Assistance. Outcome: Progressing   Problem: RH SAFETY Goal: RH STG ADHERE TO SAFETY PRECAUTIONS W/ASSISTANCE/DEVICE Description: STG Adhere to Safety Precautions With min Assistance/Device. Outcome: Progressing Goal: RH STG DECREASED RISK OF FALL WITH ASSISTANCE Description: STG Decreased Risk of Fall With min Assistance. Outcome: Progressing   Problem: RH PAIN MANAGEMENT Goal: RH STG PAIN MANAGED AT OR BELOW PT'S PAIN GOAL Description: Less than 4 with PRN medications Outcome: Progressing   Problem: RH KNOWLEDGE DEFICIT Goal: RH STG INCREASE KNOWLEDGE OF STROKE PROPHYLAXIS Description: Patient/caregiver will be able to manage new medications related to stroke and Afib from nursing education, nursing handouts and other resources independently  Outcome: Progressing

## 2023-06-19 NOTE — Progress Notes (Signed)
Physical Therapy Session Note  Patient Details  Name: AREEJ FLOOK MRN: [id number] Date of Birth: 1930-03-09  Today's Date: 06/19/2023 PT Individual Time: 0865-7846 PT Individual Time Calculation (min): 69 min   Short Term Goals: Week 1:  PT Short Term Goal 1 (Week 1): STG = LTG d/t ELOS  Skilled Therapeutic Interventions/Progress Updates:    Pt received supported upright in bed resting, but easily awakens and is agreeable to therapy session. Supine>sitting L EOB, supervision progressing to mod-I. Sitting EOB, donned shoes with set-up assist and increased time - close SBA for safety. Sit<>stands, no AD, with CGA/close SBA for safety during session.   Gait training ~158ft x2 to/from main therapy gym, no AD, with CGA for safety - pt demos increased R/L lateral sway, but not causing only minor balance instability, increased trunk rotation, reciprocal stepping pattern, decreased gait speed, R LE excessively externally rotated, and slight R lateral trunk flexion.   Dynamic gait training with dual-task challenge to promote head rotations and sudden start/stops and turning to locate numbered disks in ascending order (1-12) - CGA for safety with 1x light min A when turning quickly towards the R and not picking up R LE fast enough to step it.  Dynamic balance challenges as follows:  - forward/backwards stepping over walking stick with alternating lead LE x10-15reps, requires CGA/light min A for steadying with pt starting with R HHA progressed to no UE support - forward/backwards step-up on/off 4" step with R UE support progressed to no UE support (for pt comfort and confidence) - x10reps leading with each LE, pt tends to rotate hips to step sideways instead of backwards off the step - requires min A progressed to light min/CGA - progressed last activity with added dual-task challenge of picking up clothespins from lower surface on R side before stepping up onto 4" step and placing clothespins on  basketball goal - targeting R UE NMR and head turns - CGA/light min A for steadying balance when stepping up/down   Dynamic gait training using agility ladder including the following:  - forward reciprocal stepping pattern down/back x2 with CGA and 1x light min A for steadying - sideways stepping down/back x2 with CGA/light min A and pt continuing to rotate hips in the direction she is stepping rather than truly stepping sideways, impaired body awareness to know she is rotating her hips  - backwards step-to pattern alternating lead LE x1 rep with R HHA providing heavy min A for balance support  Throughout all dynamic balance and gait tasks, pt with good awareness to decrease speed of movement and take additional time adjusting feet to ensure she maintains her balance.  Practiced stepping on/off 5" curb step x5 reps to prepare for home entry (sounds like it is 2 STE without HRs but can use doorframe for support) - pt does best leading with L LE on ascent and R LE on descent - started with R HHA and progressed to no UE support but requires min A for balance throughout.  Gait training back to room as described above. Pt left seated EOB with needs in reach, bed alarm on, and meal tray set-up.   Therapy Documentation Precautions:  Precautions Precautions: Fall Precaution Comments: mild R inattention and hemipareisis Restrictions Weight Bearing Restrictions: No   Pain:  Denies pain during session.    Therapy/Group: Individual Therapy  Ginny Forth , PT, DPT, NCS, CSRS 06/19/2023, 3:31 PM

## 2023-06-19 NOTE — Progress Notes (Signed)
Speech Language Pathology Daily Session Note  Patient Details  Name: Sara Donaldson MRN: [id number] Date of Birth: 08/05/1929  Today's Date: 06/19/2023 SLP Individual Time: 1345-1430 SLP Individual Time Calculation (min): 45 min  Short Term Goals: Week 1: SLP Short Term Goal 1 (Week 1): STGs=LTGs d/t ELOS  Skilled Therapeutic Interventions: SLP conducted skilled therapy session targeting cognitive retraining goals. Guided patient through cognitive puzzle task with patient benefiting from min cues overall to accurately scan, locate, and place pieces in a logical manner. Patient oriented to date with supervision cues and recalled events from the morning with min-mod assist overall. Re: motor speech, patient reports her speech is 75% back to baseline. Reviewed speech intelligibility strategies with patient verbalizing understanding. Patient was left in lowered bed with call bell in reach and bed alarm set. SLP will continue to target goals per plan of care.       Pain Pain Assessment Pain Scale: 0-10 Pain Score: 0-No pain  Therapy/Group: Individual Therapy  Jeannie Done, M.A., CCC-SLP  Yetta Barre 06/19/2023, 4:01 PM

## 2023-06-19 NOTE — Progress Notes (Signed)
Occupational Therapy Session Note  Patient Details  Name: Sara Donaldson MRN: [id number] Date of Birth: Jan 16, 1930  Today's Date: 06/19/2023 OT Individual Time: 0920-1015 OT Individual Time Calculation (min): 55 min    Short Term Goals: Week 1:  OT Short Term Goal 1 (Week 1): STG=LTG  Skilled Therapeutic Interventions/Progress Updates:     Pt received in recliner ready for therapy.  Focus of therapy session on R side coordination and balance.       ADL Retraining: -pt completed oral care standing at sink with sup -undressed with cues to sit down to doff shirt to increase safety with balance -showered on bench but pt needed cue to stay seated to wash feet vs trying to stand on one foot at a time -cue to don R foot in pant leg first as pt tried to place L foot in first but then had difficulty getting other leg in With cues pt able to complete all self care at a supervision level.   Therapeutic Activity/ Exercise: -to increased RUE strength had pt use a 3 lb dowel bar in B hands for shoulder exercises and then in R hand only holding bar vertically to move bar forward and back and then rotating bar vertical >< horizontal.   Transfers: -pt ambulated in room without AD with light CGA  Balance: -close supervision in standing   - Pt resting in recliner with all needs met.  call light in reach.   PTA arriving for her next therapy session.  Therapy Documentation Precautions:  Precautions Precautions: Fall Precaution Comments: mild R inattention and hemipareisis Restrictions Weight Bearing Restrictions: No   Pain:  No c/o pain  ADL: ADL Eating: Set up Grooming: Setup Upper Body Bathing: Supervision/safety Where Assessed-Upper Body Bathing: Shower Lower Body Bathing: Supervision/safety Where Assessed-Lower Body Bathing: Shower Upper Body Dressing: Supervision/safety, Minimal cueing Where Assessed-Upper Body Dressing: Edge of bed Lower Body Dressing: Supervision/safety, Minimal  cueing Where Assessed-Lower Body Dressing: Edge of bed Toileting: Supervision/safety Where Assessed-Toileting: Teacher, adult education: Furniture conservator/restorer Method: Event organiser: Administrator, arts Method: Designer, industrial/product: Emergency planning/management officer, Grab bars   Therapy/Group: Individual Therapy  Ericah Scotto 06/19/2023, 10:45 AM

## 2023-06-20 DIAGNOSIS — I10 Essential (primary) hypertension: Secondary | ICD-10-CM | POA: Diagnosis not present

## 2023-06-20 DIAGNOSIS — I482 Chronic atrial fibrillation, unspecified: Secondary | ICD-10-CM | POA: Diagnosis not present

## 2023-06-20 DIAGNOSIS — Z952 Presence of prosthetic heart valve: Secondary | ICD-10-CM | POA: Diagnosis not present

## 2023-06-20 DIAGNOSIS — I63512 Cerebral infarction due to unspecified occlusion or stenosis of left middle cerebral artery: Secondary | ICD-10-CM | POA: Diagnosis not present

## 2023-06-20 MED ORDER — ALPRAZOLAM 0.25 MG PO TABS
0.2500 mg | ORAL_TABLET | Freq: Every day | ORAL | Status: DC
  Administered 2023-06-20 – 2023-06-21 (×2): 0.25 mg via ORAL
  Filled 2023-06-20 (×2): qty 1

## 2023-06-20 MED ORDER — ACETAMINOPHEN 325 MG PO TABS: 650.0000 mg | ORAL_TABLET | ORAL | Status: AC | PRN

## 2023-06-20 NOTE — Progress Notes (Signed)
Occupational Therapy Session Note  Patient Details  Name: Sara Donaldson MRN: [id number] Date of Birth: 09-12-1929  Today's Date: 06/20/2023 OT Individual Time: 7829-5621 OT Individual Time Calculation (min): 41 min    Short Term Goals: Week 1:  OT Short Term Goal 1 (Week 1): STG=LTG  Skilled Therapeutic Interventions/Progress Updates:    Patient received supine inbed- resting.  Agreeable to OT session.  Denied need to use the bathroom.  Patient indicates she walks to gym without use of walker.  Patient walked with mod facilitation to encourage weight shift toward right.  Patient fairly strongly pulling to left thus ending up with very wide base of support and very shirt duration on right LE during walking.  Did well with cue - move your right hip toward me - otherwise patient pulling top of body toward left.  Patient responded well to facilitation and audible step count (like a metronome) to help even time on each foot.  Also patient walking with more narrow base of support.  Worked on walking and carrying an object bilaterally - large ball.  Then worked on walking while carrying a cup of water back to room in right hand.  Patient has mild right sided inattention, and forced use concept seemed very effective this session.  Patient left in bed with alarm engaged and personal items in reach.    Therapy Documentation Precautions:  Precautions Precautions: Fall Precaution Comments: mild R inattention and hemipareisis Restrictions Weight Bearing Restrictions: No  Pain: Denies pain   Therapy/Group: Individual Therapy  Collier Salina 06/20/2023, 1:45 PM

## 2023-06-20 NOTE — Progress Notes (Signed)
Speech Language Pathology Daily Session Note  Patient Details  Name: JOYCEE HECKSTALL MRN: [id number] Date of Birth: 1930/05/16  Today's Date: 06/20/2023 SLP Individual Time: 0913-0958 SLP Individual Time Calculation (min): 45 min  Short Term Goals: Week 1: SLP Short Term Goal 1 (Week 1): STGs=LTGs d/t ELOS  Skilled Therapeutic Interventions: Skilled therapy session focused on cognitive and communication goals. SLP faciliated session by reviewing orientation and awareness questions. Patient oriented x4 given supervision A. Patient able to recall physical changes independently, however required maxA to recall cognitive changes including speech and memory. SLP addressed communication through reviewing speech intelligibility and word finding strategies. SLP utilized semantic feature analysis (SFA) for patient to describe nouns. Patient completed SFA activity with minA, occasionally requiring phonemic cues to complete responsive naming tasks. Patient unable to recall speech intelligibility strategies, therefore SLP re-educated. Patient reports speech is ~80% back to normal. SLP provided supervisionA for patient to [employer] taught strategies during conversation throughout session. Patient left in bed with alarm set and call bell in reach. Of note, patient very tired throughout session with eyes closed during majority of tasks, therefore fatigue may have impacted todays performance. Continue POC   Pain Pain Assessment Pain Scale: 0-10 Pain Score: 0-No pain  Therapy/Group: Individual Therapy  Orris Perin M.A., CF-SLP 06/20/2023, 9:56 AM

## 2023-06-20 NOTE — Progress Notes (Signed)
Occupational Therapy Session Note  Patient Details  Name: Sara Donaldson MRN: [id number] Date of Birth: 04-May-1930  Today's Date: 06/20/2023 OT Individual Time: 2440-1027 OT Individual Time Calculation (min): 46 min    Short Term Goals: Week 1:  OT Short Term Goal 1 (Week 1): STG=LTG  Skilled Therapeutic Interventions/Progress Updates:  Pt greeted supine in bed, pt agreeable to OT intervention.      Transfers/bed mobility: pt completed bed mobility with supervision, ambulatory transfer to/from gym with no AD and CGA. Pt completed functional ambulation from room<>gym with no AD and CGA.   Therapeutic activity: assessed BUE coordination using BITS with pt instrcted to reach out of BOS to screen to tap stimulus with RUE only and then LUE, results below:  RUE- 80% accuracy, 2.98 sec reaction time LUE- 91% accureacy, 2.52 second reaction time   Pt completed Functional reaching task while standing on airex with an emphasis on RUE coordination, dynamic balance and R attention with CGA and unilateral hand help support   Pt completed [facility]ated [facility] task with pt using BUEs to put together small puzzle to facilitate improved RUE coordination for ADL tasks, pt completd task with + time and supervision.   Pt completed dynamic balance task with pt ambulating though agility ladder with an emphasis on balance and tandem stepping for ADL safety. Pt required MIN HHA to complete task.    ADLs:  Transfers: ambulatory transfer to bathroom with no Ad and CGA.  Toileting: supervision for 3/3 toileting tasks, continent urin evoid.     Ended session with pt seated in recliner with all needs within reach.          Therapy Documentation Precautions:  Precautions Precautions: Fall Precaution Comments: mild R inattention and hemipareisis Restrictions Weight Bearing Restrictions: No  Pain: No pain    Therapy/Group: Individual Therapy  Barron Schmid 06/20/2023, 12:26 PM

## 2023-06-20 NOTE — Plan of Care (Signed)
  Problem: Consults Goal: RH STROKE PATIENT EDUCATION Description: See Patient Education module for education specifics  Outcome: Progressing   Problem: RH BOWEL ELIMINATION Goal: RH STG MANAGE BOWEL WITH ASSISTANCE Description: STG Manage Bowel with min Assistance. Outcome: Progressing   Problem: RH BLADDER ELIMINATION Goal: RH STG MANAGE BLADDER WITH ASSISTANCE Description: STG Manage Bladder With min Assistance Outcome: Progressing   Problem: RH SKIN INTEGRITY Goal: RH STG SKIN FREE OF INFECTION/BREAKDOWN Description: Incision will remain intact and free of infection/breakdown with min assist  Outcome: Progressing Goal: RH STG ABLE TO PERFORM INCISION/WOUND CARE W/ASSISTANCE Description: STG Able To Perform Incision/Wound Care With min Assistance. Outcome: Progressing   Problem: RH SAFETY Goal: RH STG ADHERE TO SAFETY PRECAUTIONS W/ASSISTANCE/DEVICE Description: STG Adhere to Safety Precautions With min Assistance/Device. Outcome: Progressing Goal: RH STG DECREASED RISK OF FALL WITH ASSISTANCE Description: STG Decreased Risk of Fall With min Assistance. Outcome: Progressing   Problem: RH PAIN MANAGEMENT Goal: RH STG PAIN MANAGED AT OR BELOW PT'S PAIN GOAL Description: Less than 4 with PRN medications Outcome: Progressing   Problem: RH KNOWLEDGE DEFICIT Goal: RH STG INCREASE KNOWLEDGE OF STROKE PROPHYLAXIS Description: Patient/caregiver will be able to manage new medications related to stroke and Afib from nursing education, nursing handouts and other resources independently  Outcome: Progressing

## 2023-06-20 NOTE — Progress Notes (Signed)
PROGRESS NOTE   Subjective/Complaints:  No issues overnite  ROS: Patient denies CP, SOB, N/V/D   Objective:   No results found. No results for input(s): "WBC", "HGB", "HCT", "PLT" in the last 72 hours.  No results for input(s): "NA", "K", "CL", "CO2", "GLUCOSE", "BUN", "CREATININE", "CALCIUM" in the last 72 hours.   Intake/Output Summary (Last 24 hours) at 06/20/2023 0820 Last data filed at 06/20/2023 0746 Gross per 24 hour  Intake 537 ml  Output --  Net 537 ml        Physical Exam: Vital Signs Blood pressure 132/75, pulse 75, temperature 98.1 F (36.7 C), resp. rate 16, height 5\' 1"  (1.549 m), weight 64.1 kg, SpO2 96%.  General: No acute distress Mood and affect are appropriate Heart: Regular rate and rhythm no rubs murmurs or extra sounds Lungs: Clear to auscultation, breathing unlabored, no rales or wheezes Abdomen: Positive bowel sounds, soft nontender to palpation, nondistended Extremities: No clubbing, cyanosis, or edema Skin: No evidence of breakdown, no evidence of rash Neurologic: speech dysarthric , mild anomia,  motor strength is 5/5 in left and 4+/5 Right deltoid, bicep, tricep, grip,5/5 , 4+ Bilateral  hip flexor, knee extensors, ankle dorsiflexor and plantar flexor  Musculoskeletal: Full range of motion in all 4 extremities. No joint swelling   Assessment/Plan: 1. Functional deficits which require 3+ hours per day of interdisciplinary therapy in a comprehensive inpatient rehab setting. Physiatrist is providing close team supervision and 24 hour management of active medical problems listed below. Physiatrist and rehab team continue to assess barriers to discharge/monitor patient progress toward functional and medical goals  Care Tool:  Bathing    Body parts bathed by patient: Right arm, Left arm, Chest, Abdomen, Front perineal area, Buttocks, Right upper leg, Left upper leg, Right lower leg, Left  lower leg, Face         Bathing assist Assist Level: Contact Guard/Touching assist     Upper Body Dressing/Undressing Upper body dressing   What is the patient wearing?: Pull over shirt    Upper body assist Assist Level: Contact Guard/Touching assist    Lower Body Dressing/Undressing Lower body dressing      What is the patient wearing?: Underwear/pull up, Pants     Lower body assist Assist for lower body dressing: Minimal Assistance - Patient > 75%     Toileting Toileting    Toileting assist Assist for toileting: Contact Guard/Touching assist     Transfers Chair/bed transfer  Transfers assist     Chair/bed transfer assist level: Minimal Assistance - Patient > 75%     Locomotion Ambulation   Ambulation assist      Assist level: Contact Guard/Touching assist Assistive device: Walker-rolling Max distance: 130 ft   Walk 10 feet activity   Assist     Assist level: Minimal Assistance - Patient > 75% Assistive device: No Device   Walk 50 feet activity   Assist    Assist level: Contact Guard/Touching assist Assistive device: Walker-rolling    Walk 150 feet activity   Assist    Assist level: Contact Guard/Touching assist Assistive device: Walker-rolling    Walk 10 feet on uneven surface  activity  Assist Walk 10 feet on uneven surfaces activity did not occur: Safety/medical concerns         Wheelchair     Assist Is the patient using a wheelchair?: Yes Type of Wheelchair: Manual    Wheelchair assist level: Dependent - Patient 0% Max wheelchair distance: 150 ft    Wheelchair 50 feet with 2 turns activity    Assist        Assist Level: Dependent - Patient 0%   Wheelchair 150 feet activity     Assist      Assist Level: Dependent - Patient 0%   Blood pressure 132/75, pulse 75, temperature 98.1 F (36.7 C), resp. rate 16, height 5\' 1"  (1.549 m), weight 64.1 kg, SpO2 96%.  Medical Problem List and  Plan: 1. Functional deficits secondary to left MCA infarct with left MCA M1 occlusion status post thrombectomy- mild aphasia , mild LUE weakness              -patient may  shower             -ELOS/Goals: 10-14 days, Mod I to sup with PT/OT/SLP- Team conference today please see physician documentation under team conference tab, met with team  to discuss problems,progress, and goals. Formulized individual treatment plan based on medical history, underlying problem and comorbidities.             -Continue CIR therapies including PT, OT, and SLP 2.  Antithrombotics: -DVT/anticoagulation:  Pharmaceutical: Eliquis 2.5 mg twice daily, Eliquis lifelong             -antiplatelet therapy: Aspirin 81 mg daily 3. Pain Management: Tylenol as needed 4. Mood/Behavior/Sleep: Xanax 0.25 mg nightly as needed             -antipsychotic agents: N/A  -11/30 Melatonin PRN insomnia   12/1 insomnia improved continue current regimen 5. Neuropsych/cognition: This patient is capable of making decisions on her own behalf. 6. Skin/Wound Care: Routine skin checks 7. Fluids/Electrolytes/Nutrition: encourage PO  I personally reviewed the patient's labs today.  WNL  -protein supp for low albumin 8.  Atrial fibrillation as well as history of aortic valve repair.  .  Cardiac rate controlled.  Continue Eliquis as directed             -Consider clarifying with cardiology plan for once a day diltiazem?  -11/29 HR in 80's to 90's   -cardiology changed to diltiazem CD 240mg  qd and increased metoprolol to 50mg  bid   Appreciate cardiology assist 9.  Hyperlipidemia.  Pravachol.  No high intensity statin due to LDL at goal. Heart healthy diet 10.  Hypertension.  Long-term goal normotensive.  Home medications amlodipine 5 mg(d/c ed), Lasix 20 mg (d/c ed) , hydralazine 25 mg (d/ced) , lisinopril 5 mg (d/c ed), losartan 100 mg                06/20/2023    7:50 AM 06/20/2023    4:22 AM 06/19/2023    8:20 PM  Vitals with BMI  Systolic  132 141 146  Diastolic 75 86 83  Pulse 75 79 80   Losartan 100mg  every day restarted per cardiology on 12/1 Cardizem CD 300mg  every day Metoprolol 50mg  BID   11.  Insomnia- chronic scheduled melatonin 5mg  at bedtime but benefit , also has prn alprazolam available but has not taken, will schedule  LOS: 7 days A FACE TO FACE EVALUATION WAS PERFORMED  Erick Colace 06/20/2023, 8:20 AM

## 2023-06-20 NOTE — Progress Notes (Signed)
Patient ID: Sara Donaldson, female   DOB: [birth year], 89 y.o.   MRN: [id number]  Team Conference Report to Patient/Family  Team Conference discussion was reviewed with the patient and caregiver, including goals, any changes in plan of care and target discharge date.  Patient and caregiver express understanding and are in agreement.  The patient has a target discharge date of 06/22/23.  SW met with patient in the room and called daughter bedside. Daughter plans to stay at the hospital tonight. Daughter arranged family education for tomorrow 9a-12p.  SW discussed OP. No additional questions or concerns currently.  YVETT MUZZEY 06/20/2023, 2:17 PM

## 2023-06-20 NOTE — Progress Notes (Signed)
Physical Therapy Session Note  Patient Details  Name: Sara Donaldson MRN: [id number] Date of Birth: [birth year]  Today's Date: 06/20/2023 PT Individual Time: 1620-1735 PT Individual Time Calculation (min): 75 min   Short Term Goals: Week 1:  PT Short Term Goal 1 (Week 1): STG = LTG d/t ELOS  Skilled Therapeutic Interventions/Progress Updates:    Pt received supine in bed awake and agreeable to therapy session. Pt reports feeling good about D/C plan with no specific questions/concerns. Supine>sitting L EOB, HOB partially elevated, mod-I. Sitting EOB donned socks and shoes set-up assist. Notified nurse of plan to go off unit for session.   Gait training in/out bathroom, no AD, with supervision and performed 3/3 toileting tasks mod-I, continent of bladder.  Pt requesting to attempt gait training to/from [facility] without AD, but is in agreement that using RW is safer and she is open to recommendation to use that in the future during community mobility without a therapist present.   Gait training ~200-354ft bouts x3 reps to get to/from atrium and throughout gift shop, no AD, with close SBA for safety on her R side and CGA/min A for steadying balance when stepping on/off elevator using either railing support or HHA. Noticed potential slight R inattention when visually scanning to locate items in environment on therapist's cues but difficult to determine fully. Pt continues to ambulate with slow gait speed, increased R/L lateral sway, causing only slight balance instability, increased trunk rotation, reciprocal stepping pattern, R LE excessively externally rotated, and slight R lateral trunk flexion. During gait training in gift shop, pt challenged with visually scanning to find 4 items (books, flowers, cards, and slippers) challenging her short term recall and dynamic balance to rotate head while ambulating to scan for items in the community environment with other people walking around her.  Pt reports she  was nervous that someone would bump into her and discussed how use of AD would also allow other people to see that she has impaired balance and to be cautious around her.  Discussed option of trying a SPC for gait with pt open to the idea - no standard SPCs available at this time (only weighted canes) so modified a forearm crutch to simulate a "cane" in her R UE and participated in gait training ~69ft with CGA and pt requiring hand-over-hand facilitation to sequence the cane correctly during gait due to frequently just dragging it next to her. Pt reports she would like to keep practicing with the cane and has one at home if needed. Therapist educated that the RW is the most safe AD; however, the cane could be an alternative option if the RW will not fit in her home and if she learns to use the cane safely.   Performed seated R UE NMR task of creating mini-PEG board pattern (medium complexity - 5 circles) - pt does well placing all of the PEGs in correct place without cuing given increased time for dexterity in R hand, until getting to bottom R corner when pt requiring mod cuing to place PEGs in correct place as pt unable to problem solve her errors and had them all shifted off from their target location.  Performed picture of a sail boat next with pt able to problem solve correct placement of each PEG.  Gait training ~17ft back to her room using modified "cane" with CGA, but pt continues to have decreased attention to R hand and use of the cane either slightly dragging it or only moving it  every other step - therapist attempted on/off bouts of hand-over-hand assist to see if she could carryover correct sequence, but unsuccessful. Educated pt on recommendation to use RW instead and pt in agreement.   Pt left seated EOB with needs in reach, bed alarm on, and nurse and NT present for meds and meal tray delivery.   Therapy Documentation Precautions:  Precautions Precautions: Fall Precaution Comments:  mild R inattention and hemipareisis Restrictions Weight Bearing Restrictions: No   Pain: No reports of pain throughout session.    Therapy/Group: Individual Therapy  Ginny Forth , PT, DPT, NCS, CSRS 06/20/2023, 4:16 PM

## 2023-06-20 NOTE — Patient Care Conference (Signed)
Inpatient RehabilitationTeam Conference and Plan of Care Update Date: 06/20/2023   Time: 10:18 AM    Patient Name: Sara Donaldson      Medical Record Number: [id number]  Date of Birth: May 19, 1930 Sex: Female         Room/Bed: 4M06C/4M06C-01 Payor Info: Payor: MEDICARE / Plan: MEDICARE PART A AND B / Product Type: *No Product type* /    Admit Date/Time:  06/13/2023  2:27 PM  Primary Diagnosis:  Left middle cerebral artery stroke [facility])  Hospital Problems: Principal Problem:   Left middle cerebral artery stroke [facility])    Expected Discharge Date: Expected Discharge Date: 06/22/23  Team Members Present: Physician leading conference: Dr. Claudette Laws Social Worker Present: Lavera Guise, BSW Nurse Present: Chana Bode, RN PT Present: Casimiro Needle, PT OT Present: Mariann Barter, OT SLP Present: Everardo Pacific, SLP PPS Coordinator present : Fae Pippin, SLP     Current Status/Progress Goal Weekly Team Focus  Bowel/Bladder   pt continent of b/b   Remain continent   Assist with toileting qshift and prn    Swallow/Nutrition/ Hydration               ADL's   close supervision with self care and CGA with ambulation and transfers without AD. Min cues for safest strategies and positions.    Using R arm/ hand well.   Mod I with toileting, toilet transfers, dressing; supervision with bathing and shower transfers   ADL training, pt education, balance, R side coordination/strength    Mobility   Bed Mobility = supervision; Transfer = supervision; Ambulation = supervision/CGA (with/without RW)   ModI/Supervision overall  Focus = Ambulation endurance, dynamic standing balance, multidirectional stepping, d/c preparation    Communication   Patient reports speech is 75% back to baseline. Mod assist for use of speech intelligibility strategies.   min assist   speech intelligibility increasing strategies    Safety/Cognition/ Behavioral Observations  min-mod assist for  problem solving, memory, awareness; supervision for orientation   min assist   problem solving, memory, awareness    Pain   No c/o pain   Remain pain free   Assess qshift and prn    Skin   Skin intact   Remain intact  Assess qshift and prn      Discharge Planning:  Discharging home with assistance from daughter and granddaughter, 24/7. 1 level home, 1 step.   Team Discussion: Patient post left MCA CVA with expressive aphasia, dysarthria, memory issues, and decreased awareness with a history of A-fib, memory issues, insomnia and HTN.   Patient on target to meet rehab goals: yes, currently needs close supervision for ADLs and CGA for transfers without an assistive device. Requires min cues for safety. Able to ambulate with/without a RW with supervision - CGA.  Needs min - mod assist for cognition. Goals for discharge set for mod I- independent overall.  *See Care Plan and progress notes for long and short-term goals.   Revisions to Treatment Plan:  N/a   Teaching Needs: Safety, medications, transfers, toileting, etc.   Current Barriers to Discharge: N/A  Possible Resolutions to Barriers: Family education OP follow up services No DME     Medical Summary Current Status: AFib and HTN requiring cardiology consult to optimize meds  Barriers to Discharge: Uncontrolled Hypertension   Possible Resolutions to Barriers/Weekly Focus: cont with med adjustments as per cardiology   Continued Need for Acute Rehabilitation Level of Care: The patient requires daily medical management by a physician  with specialized training in physical medicine and rehabilitation for the following reasons: Direction of a multidisciplinary physical rehabilitation program to maximize functional independence : Yes Medical management of patient stability for increased activity during participation in an intensive rehabilitation regime.: Yes Analysis of laboratory values and/or radiology reports with any  subsequent need for medication adjustment and/or medical intervention. : Yes   I attest that I was present, lead the team conference, and concur with the assessment and plan of the team.   Chana Bode B 06/20/2023, 3:35 PM

## 2023-06-21 ENCOUNTER — Other Ambulatory Visit (HOSPITAL_COMMUNITY): Payer: Self-pay

## 2023-06-21 DIAGNOSIS — I482 Chronic atrial fibrillation, unspecified: Secondary | ICD-10-CM | POA: Diagnosis not present

## 2023-06-21 DIAGNOSIS — Z952 Presence of prosthetic heart valve: Secondary | ICD-10-CM | POA: Diagnosis not present

## 2023-06-21 DIAGNOSIS — I10 Essential (primary) hypertension: Secondary | ICD-10-CM | POA: Diagnosis not present

## 2023-06-21 DIAGNOSIS — I63512 Cerebral infarction due to unspecified occlusion or stenosis of left middle cerebral artery: Secondary | ICD-10-CM | POA: Diagnosis not present

## 2023-06-21 MED ORDER — APIXABAN 2.5 MG PO TABS
2.5000 mg | ORAL_TABLET | Freq: Two times a day (BID) | ORAL | 0 refills | Status: AC
  Filled 2023-06-21: qty 60, 30d supply, fill #0

## 2023-06-21 MED ORDER — DILTIAZEM HCL ER COATED BEADS 300 MG PO CP24
300.0000 mg | ORAL_CAPSULE | Freq: Every day | ORAL | 0 refills | Status: AC
  Filled 2023-06-21: qty 30, 30d supply, fill #0

## 2023-06-21 MED ORDER — PRAVASTATIN SODIUM 40 MG PO TABS
40.0000 mg | ORAL_TABLET | Freq: Every day | ORAL | 0 refills | Status: AC
  Filled 2023-06-21: qty 30, 30d supply, fill #0

## 2023-06-21 MED ORDER — ALPRAZOLAM 0.25 MG PO TABS
0.2500 mg | ORAL_TABLET | Freq: Every day | ORAL | 0 refills | Status: AC
  Filled 2023-06-21: qty 30, 30d supply, fill #0

## 2023-06-21 MED ORDER — LOSARTAN POTASSIUM 100 MG PO TABS
100.0000 mg | ORAL_TABLET | Freq: Every day | ORAL | 0 refills | Status: AC
  Filled 2023-06-21: qty 30, 30d supply, fill #0

## 2023-06-21 MED ORDER — METOPROLOL TARTRATE 50 MG PO TABS
50.0000 mg | ORAL_TABLET | Freq: Two times a day (BID) | ORAL | 0 refills | Status: AC
  Filled 2023-06-21: qty 60, 30d supply, fill #0

## 2023-06-21 NOTE — Progress Notes (Signed)
Physical Therapy Session Note  Patient Details  Name: Sara Donaldson MRN: [id number] Date of Birth: May 03, 1930  Today's Date: 06/21/2023 PT Individual Time: 4259-5638; 1405 - 1501 PT Individual Time Calculation (min): 58 min; 56 min   Short Term Goals: Week 1:  PT Short Term Goal 1 (Week 1): STG = LTG d/t ELOS  SESSION 1 Skilled Therapeutic Interventions/Progress Updates: Patient supine in bed with family present on entrance to room. Patient alert and agreeable to PT session.   Patient reported no pain throughout session. Today's session focused on family education and grad day questions/activities. Pt daughter present throughout session, and will be present after pt d/c's home.   Therapeutic Activity: Bed Mobility: Pt performed supine<>sit on EOB with supervision. VC required to avoid use of bed features as pt's bed at home is a flat bed. Pt performed bridge to adjust self to center in bed with VC's for sequence. Transfers: Pt performed sit<>stand transfers throughout session with RW or no AD and with supervision. Provided VC for pt to reach back to surfaces to control descent vs having B UE's on RW. Pt required mod cues throughout session to do so, along with maintaining safe proximity of RW.  Car Transfer: - Pt performed with RW and with daughter. Pt with supervision and VC to maintain safe proximity of RW. Pt daughter assisted in transfer with VC to remain to pt's side vs in front of RW for safety.   Gait Training:  Pt ambulated throughout session (room<ortho gym<main gym - breaks in between - and back to room - 300'+ from main gym<elevators by W wing and back to room at end of session) using RW with supervision. Pt with decrease in R sway of RW as pt's postural alignment has increased from R shoulder being posterior to L, and only required min cues to self correct with manual facilitation - pt daughter educated on this with pt and daughter of pt understanding. Pt daughter also educated to  look for pt decrease in step clearance, and to cue when necessary.   Stair Navigation: Pt daughter informed that pt should have a handrail installed by d/c date (12/6), but was not 100% sure (PTA suggested curb navigation next station with RW just in case as pt only has 2 steps (one in car port, and one to get into house). Pt and pt daughter still wanted to practice stair navigation for community integration.  - Ascending 4 (6") steps with use of BHR, and with CGA for safety. Pt with step-to pattern. Pt did not required VC this time.  - Descending 4 (6") steps with use of BHR, and with CGA required for safety. Pt with step-to pattern. Pt given VC to step up close to step prior to descending.   Patient supine in bed at end of session with brakes locked, daughter present, bed alarm set, and all needs within reach.   SESSION 2 Skilled Therapeutic Interventions/Progress Updates: Patient supine in bed with daughter present on entrance to room. Patient alert and agreeable to PT session.   Patient reported no pain, only fatigue at beginning of PT session.  Pt daughter with no further questions or concerns regarding safe d/c home of pt (pt also with no concerns) at end of session (only what was addressed during today's PT session).   Therapeutic Activity: Bed Mobility: Pt performed supine<>sit on EOB with CGA/supervision. VC provided for safe sequence to avoid posterior lean and lifting of legs to avoid strain on lower back. Pt  cued to lean on elbow (L) and to bring R LE first, followed by second with pt daughter educated as pt tends to fall posteriorly when attempting to advance LE onto bed.  Transfers: Pt performed sit<>stand transfers throughout session with RW and with supervision. Provided VC for safe management of RW (maintain proximity with daughter and pt further educated as to avoid anterior fall).  TUG (avg = 19.43s) from chair and with RW  - 20.18s  - 19.81s  - 18.31s  5xSTS (avg =  20.46s) from chair. Pt required VC to avoid picking feet up when sitting due to decreased eccentric control  - 21.18s  - 19.55s  - 20.65s  Curb Navigation: - Pt navigated 4" curb with RW with CGA for safety and demonstrative/instructional cues. Pt required mod cues to step up close to front of RW to ensure safe proximity when picking up RW. Daughter assisted with one round (ascending/descending) with good cuing to pt.    Patient supine in bed at end of session with brakes locked, daughter present, alarm set, and all needs within reach.      Therapy Documentation Precautions:  Precautions Precautions: Fall Precaution Comments: mild R inattention and hemipareisis that has improved since evaluation Restrictions Weight Bearing Restrictions: No  Therapy/Group: Individual Therapy  Taylar Hartsough PTA 06/21/2023, 3:16 PM

## 2023-06-21 NOTE — Progress Notes (Signed)
Speech Language Pathology Discharge Summary  Patient Details  Name: Sara Donaldson MRN: [id number] Date of Birth: 1930/06/02  Date of Discharge from SLP service:June 21, 2023  Today's Date: 06/21/2023 SLP Individual Time: 0900-0930 SLP Individual Time Calculation (min): 30 min   Skilled Therapeutic Interventions:   Skilled therapy session focused on family education and cognitive goals. SLP provided education to patients daughter regarding patients current level of cognitive functioning, mild word finding difficulties and dysarthria. SLP provided patient/family with strategies to assist in speech intelligibility, memory, and word finding. SLP and daughter discussed use of memory aids and importance of providing assistance during higher level cognitive tasks (medication management). SLP addressed cognition by providing modA during identification of medication mistakes tasks. Patient with difficulty understanding layout of BID pillbox requiring explaination x3. Patient/family verbalized understanding with no further questions. Patient left in bed with alarm set and call bell in reach. Continue POC.     Patient has met 2 of 5 long term goals.  Patient to discharge at overall Mod;Min level.  Reasons goals not met: continues to require modA   Clinical Impression/Discharge Summary: Pt has made fair gains and has met 2 of 5 LTG's this admission due to improved communication. Pt is currently an overall minA for communicative tasks such as utilizing word finding and speech intelligibility strategies during conversation. Patient continues to require modA during cognitive tasks such as memory, problem solving and emergent awareness. Pt/family education complete and pt will discharge home with 24 hour supervision from friends/family/etc. Pt would benefit from [facility] f/u ST services to maximize cognition in order to maximize functional independence.     Care Partner:  Caregiver Able to Provide Assistance: Yes   Type of Caregiver Assistance: Physical;Cognitive  Recommendation:  Home Health SLP  Rationale for SLP Follow Up: Maximize cognitive function and independence   Equipment: none   Reasons for discharge: Discharged from hospital   Patient/Family Agrees with Progress Made and Goals Achieved: Yes    Hieu Herms M.A., CF-SLP 06/21/2023, 12:38 PM

## 2023-06-21 NOTE — Plan of Care (Signed)
  Problem: RH Balance Goal: LTG: Patient will maintain dynamic sitting balance (OT) Description: LTG:  Patient will maintain dynamic sitting balance with assistance during activities of daily living (OT) Outcome: Adequate for Discharge Note: Due to right inattention and decreased safety awareness, pt should continue to have supervision. Goal: LTG Patient will maintain dynamic standing with ADLs (OT) Description: LTG:  Patient will maintain dynamic standing balance with assist during activities of daily living (OT)  Outcome: Adequate for Discharge Note: Due to right inattention and decreased safety awareness, pt should continue to have supervision.   Problem: Sit to Stand Goal: LTG:  Patient will perform sit to stand in prep for activites of daily living with assistance level (OT) Description: LTG:  Patient will perform sit to stand in prep for activites of daily living with assistance level (OT) Outcome: Adequate for Discharge Note: Due to right inattention and decreased safety awareness, pt should continue to have supervision.   Problem: RH Grooming Goal: LTG Patient will perform grooming w/assist,cues/equip (OT) Description: LTG: Patient will perform grooming with assist, with/without cues using equipment (OT) Outcome: Adequate for Discharge   Problem: RH Dressing Goal: LTG Patient will perform upper body dressing (OT) Description: LTG Patient will perform upper body dressing with assist, with/without cues (OT). Outcome: Adequate for Discharge Note: Due to right inattention and decreased safety awareness, pt should continue to have supervision. Goal: LTG Patient will perform lower body dressing w/assist (OT) Description: LTG: Patient will perform lower body dressing with assist, with/without cues in positioning using equipment (OT) Outcome: Adequate for Discharge Note: Due to right inattention and decreased safety awareness, pt should continue to have supervision.   Problem: RH  Toileting Goal: LTG Patient will perform toileting task (3/3 steps) with assistance level (OT) Description: LTG: Patient will perform toileting task (3/3 steps) with assistance level (OT)  Outcome: Adequate for Discharge Note: Due to right inattention and decreased safety awareness, pt should continue to have supervision.   Problem: RH Toilet Transfers Goal: LTG Patient will perform toilet transfers w/assist (OT) Description: LTG: Patient will perform toilet transfers with assist, with/without cues using equipment (OT) Outcome: Adequate for Discharge Note: Due to right inattention and decreased safety awareness, pt should continue to have supervision.   Problem: RH Awareness Goal: LTG: Patient will demonstrate awareness during functional activites type of (OT) Description: LTG: Patient will demonstrate awareness during functional activites type of (OT) Outcome: Adequate for Discharge   Problem: RH Bathing Goal: LTG Patient will bathe all body parts with assist levels (OT) Description: LTG: Patient will bathe all body parts with assist levels (OT) Outcome: Completed/Met   Problem: RH Functional Use of Upper Extremity Goal: LTG Patient will use RT/LT upper extremity as a (OT) Description: LTG: Patient will use right/left upper extremity as a stabilizer/gross assist/diminished/nondominant/dominant level with assist, with/without cues during functional activity (OT) Outcome: Completed/Met   Problem: RH Tub/Shower Transfers Goal: LTG Patient will perform tub/shower transfers w/assist (OT) Description: LTG: Patient will perform tub/shower transfers with assist, with/without cues using equipment (OT) Outcome: Completed/Met

## 2023-06-21 NOTE — Plan of Care (Signed)
  Problem: Consults Goal: RH STROKE PATIENT EDUCATION Description: See Patient Education module for education specifics  Outcome: Progressing   Problem: RH BOWEL ELIMINATION Goal: RH STG MANAGE BOWEL WITH ASSISTANCE Description: STG Manage Bowel with min Assistance. Outcome: Progressing   Problem: RH BLADDER ELIMINATION Goal: RH STG MANAGE BLADDER WITH ASSISTANCE Description: STG Manage Bladder With min Assistance Outcome: Progressing   Problem: RH SKIN INTEGRITY Goal: RH STG SKIN FREE OF INFECTION/BREAKDOWN Description: Incision will remain intact and free of infection/breakdown with min assist  Outcome: Progressing Goal: RH STG ABLE TO PERFORM INCISION/WOUND CARE W/ASSISTANCE Description: STG Able To Perform Incision/Wound Care With min Assistance. Outcome: Progressing   Problem: RH SAFETY Goal: RH STG ADHERE TO SAFETY PRECAUTIONS W/ASSISTANCE/DEVICE Description: STG Adhere to Safety Precautions With min Assistance/Device. Outcome: Progressing Goal: RH STG DECREASED RISK OF FALL WITH ASSISTANCE Description: STG Decreased Risk of Fall With min Assistance. Outcome: Progressing   Problem: RH PAIN MANAGEMENT Goal: RH STG PAIN MANAGED AT OR BELOW PT'S PAIN GOAL Description: Less than 4 with PRN medications Outcome: Progressing   Problem: RH KNOWLEDGE DEFICIT Goal: RH STG INCREASE KNOWLEDGE OF STROKE PROPHYLAXIS Description: Patient/caregiver will be able to manage new medications related to stroke and Afib from nursing education, nursing handouts and other resources independently  Outcome: Progressing

## 2023-06-21 NOTE — Progress Notes (Signed)
Inpatient Rehabilitation Care Coordinator Discharge Note   Patient Details  Name: Sara Donaldson MRN: [id number] Date of Birth: 1929/12/18   Discharge location: Home with daughter  Length of Stay: 9 days  Discharge activity level: supervision  Home/community participation: daughter and granddaughter  Patient response XB:JYNWGN Literacy - How often do you need to have someone help you when you read instructions, pamphlets, or other written material from your doctor or pharmacy?: Sometimes  Patient response FA:OZHYQM Isolation - How often do you feel lonely or isolated from those around you?: Never  Services provided included: SW, Neuropsych, Pharmacy, TR, CM, RN, SLP, OT, PT, RD, MD  Financial Services:  Financial Services Utilized: Medicare    Choices offered to/list presented to: pt and daughter  Follow-up services arranged:  Home Health, DME Home Health Agency: [facility] pt/ot    DME : none reccomended    Patient response to transportation need: Is the patient able to respond to transportation needs?: Yes In the past 12 months, has lack of transportation kept you from medical appointments or from getting medications?: No In the past 12 months, has lack of transportation kept you from meetings, work, or from getting things needed for daily living?: No   Patient/Family verbalized understanding of follow-up arrangements:  Yes  Individual responsible for coordination of the follow-up plan: Sara Donaldson [phone]  Confirmed correct DME delivered: Sara Donaldson 06/21/2023    Comments (or additional information):  Summary of Stay    Date/Time Discharge Planning CSW  06/19/23 1426 Discharging home with assistance from daughter and granddaughter, 24/7. 1 level home, 1 step. CJB       Andria Rhein

## 2023-06-21 NOTE — Progress Notes (Signed)
Inpatient Rehabilitation Discharge Medication Review by a Pharmacist  A complete drug regimen review was completed for this patient to identify any potential clinically significant medication issues.  High Risk Drug Classes Is patient taking? Indication by Medication  Antipsychotic No   Anticoagulant Yes Apixaban - AFib  Antibiotic No   Opioid No   Antiplatelet Yes ASA - stroke ppx  Hypoglycemics/insulin No   Vasoactive Medication Yes Diltiazem, losartan, metoprolol - HTN, AFib  Chemotherapy No   Other Yes Alprazolam - anxiety Pravastatin - HLD     Type of Medication Issue Identified Description of Issue Recommendation(s)  Drug Interaction(s) (clinically significant)     Duplicate Therapy     Allergy     No Medication Administration End Date     Incorrect Dose     Additional Drug Therapy Needed     Significant med changes from prior encounter (inform family/care partners about these prior to discharge). Stopping lovastatin, amlodipine, furosemide, and hydralazine Noted in discharge summary to discontinue these medications. *Amlodipine >> Diltiazem per cardiology *Lovastatin >> pravastatin per neurology  Other       Clinically significant medication issues were identified that warrant physician communication and completion of prescribed/recommended actions by midnight of the next day:  No  Name of provider notified for urgent issues identified:   Provider Method of Notification:    Pharmacist comments:   Time spent performing this drug regimen review (minutes):  30   Rexford Maus, PharmD, BCPS 06/21/2023 7:51 AM

## 2023-06-21 NOTE — Progress Notes (Signed)
PROGRESS NOTE   Subjective/Complaints:  No issues overnite , slept better daughter at bedside , aware of d/c in am  ROS: Patient denies CP, SOB, N/V/D   Objective:   No results found. No results for input(s): "WBC", "HGB", "HCT", "PLT" in the last 72 hours.  No results for input(s): "NA", "K", "CL", "CO2", "GLUCOSE", "BUN", "CREATININE", "CALCIUM" in the last 72 hours.   Intake/Output Summary (Last 24 hours) at 06/21/2023 0814 Last data filed at 06/21/2023 0740 Gross per 24 hour  Intake 698 ml  Output --  Net 698 ml        Physical Exam: Vital Signs Blood pressure 135/71, pulse 73, temperature 98.1 F (36.7 C), resp. rate 18, height 5\' 1"  (1.549 m), weight 63 kg, SpO2 96%.  General: No acute distress Mood and affect are appropriate Heart: Regular rate and rhythm no rubs murmurs or extra sounds Lungs: Clear to auscultation, breathing unlabored, no rales or wheezes Abdomen: Positive bowel sounds, soft nontender to palpation, nondistended Extremities: No clubbing, cyanosis, or edema Skin: No evidence of breakdown, no evidence of rash Neurologic: speech dysarthric , mild anomia,  motor strength is 5/5 in left and 5-/5 Right deltoid, bicep, tricep, grip,5/5 , 5- Bilateral  hip flexor, knee extensors, ankle dorsiflexor and plantar flexor  Musculoskeletal: Full range of motion in all 4 extremities. No joint swelling   Assessment/Plan: 1. Functional deficits which require 3+ hours per day of interdisciplinary therapy in a comprehensive inpatient rehab setting. Physiatrist is providing close team supervision and 24 hour management of active medical problems listed below. Physiatrist and rehab team continue to assess barriers to discharge/monitor patient progress toward functional and medical goals  Care Tool:  Bathing    Body parts bathed by patient: Right arm, Left arm, Chest, Abdomen, Front perineal area, Buttocks,  Right upper leg, Left upper leg, Right lower leg, Left lower leg, Face         Bathing assist Assist Level: Contact Guard/Touching assist     Upper Body Dressing/Undressing Upper body dressing   What is the patient wearing?: Pull over shirt    Upper body assist Assist Level: Contact Guard/Touching assist    Lower Body Dressing/Undressing Lower body dressing      What is the patient wearing?: Underwear/pull up, Pants     Lower body assist Assist for lower body dressing: Minimal Assistance - Patient > 75%     Toileting Toileting    Toileting assist Assist for toileting: Supervision/Verbal cueing     Transfers Chair/bed transfer  Transfers assist     Chair/bed transfer assist level: Contact Guard/Touching assist     Locomotion Ambulation   Ambulation assist      Assist level: Contact Guard/Touching assist Assistive device: Walker-rolling Max distance: 130 ft   Walk 10 feet activity   Assist     Assist level: Minimal Assistance - Patient > 75% Assistive device: No Device   Walk 50 feet activity   Assist    Assist level: Contact Guard/Touching assist Assistive device: Walker-rolling    Walk 150 feet activity   Assist    Assist level: Contact Guard/Touching assist Assistive device: Walker-rolling    Walk 10  feet on uneven surface  activity   Assist Walk 10 feet on uneven surfaces activity did not occur: Safety/medical concerns         Wheelchair     Assist Is the patient using a wheelchair?: Yes Type of Wheelchair: Manual    Wheelchair assist level: Dependent - Patient 0% Max wheelchair distance: 150 ft    Wheelchair 50 feet with 2 turns activity    Assist        Assist Level: Dependent - Patient 0%   Wheelchair 150 feet activity     Assist      Assist Level: Dependent - Patient 0%   Blood pressure 135/71, pulse 73, temperature 98.1 F (36.7 C), resp. rate 18, height 5\' 1"  (1.549 m), weight 63 kg,  SpO2 96%.  Medical Problem List and Plan: 1. Functional deficits secondary to left MCA infarct with left MCA M1 occlusion status post thrombectomy- mild aphasia , mild LUE weakness              -patient may  shower             -ELOS/Goals: 12/6 Mod I to sup with PT/OT/SLP-             -Continue CIR therapies including PT, OT, and SLP Will not need PMR f/u Needs to f/u with Neuro and PCP 2.  Antithrombotics: -DVT/anticoagulation:  Pharmaceutical: Eliquis 2.5 mg twice daily, Eliquis lifelong             -antiplatelet therapy: Aspirin 81 mg daily 3. Pain Management: Tylenol as needed 4. Mood/Behavior/Sleep: Xanax 0.25 mg nightly as needed             -antipsychotic agents: N/A  -11/30 Melatonin PRN insomnia   12/1 insomnia improved continue current regimen 5. Neuropsych/cognition: This patient is capable of making decisions on her own behalf. 6. Skin/Wound Care: Routine skin checks 7. Fluids/Electrolytes/Nutrition: encourage PO  I personally reviewed the patient's labs today.  WNL  -protein supp for low albumin 8.  Atrial fibrillation as well as history of aortic valve repair.  .  Cardiac rate controlled.  Continue Eliquis as directed             -Consider clarifying with cardiology plan for once a day diltiazem?  -11/29 HR in 80's to 90's   -cardiology changed to diltiazem CD 240mg  qd and increased metoprolol to 50mg  bid   Appreciate cardiology assist 9.  Hyperlipidemia.  Pravachol.  No high intensity statin due to LDL at goal. Heart healthy diet 10.  Hypertension.  Long-term goal normotensive.  Home medications amlodipine 5 mg(d/c ed), Lasix 20 mg (d/c ed) , hydralazine 25 mg (d/ced) , lisinopril 5 mg (d/c ed), losartan 100 mg                06/21/2023    4:54 AM 06/20/2023    8:35 PM 06/20/2023    2:37 PM  Vitals with BMI  Weight   138 lbs 13 oz  BMI   26.24  Systolic 135 148   Diastolic 71 72   Pulse 73 92    Losartan 100mg  every day restarted per cardiology on 12/1 Cardizem  CD 300mg  every day Metoprolol 50mg  BID   11.  Insomnia- chronic scheduled melatonin 5mg  at bedtime but benefit , also has prn alprazolam available but has not taken, will schedule  LOS: 8 days A FACE TO FACE EVALUATION WAS PERFORMED  Erick Colace 06/21/2023, 8:14 AM

## 2023-06-21 NOTE — Progress Notes (Signed)
Occupational Therapy Discharge Summary  Patient Details  Name: Sara Donaldson MRN: [id number] Date of Birth: Apr 08, 1930  Date of Discharge from OT service:June 21, 2023   Patient has met 3 (the 9 goals not met are adequate for discharge as pt needs supervision vs no supervision) of 12 long term goals due to improved activity tolerance, improved balance, ability to compensate for deficits, functional use of  RIGHT upper and RIGHT lower extremity, and improved coordination.  Patient to discharge at overall Supervision level.  Patient's care partner is independent to provide the necessary physical and cognitive assistance at discharge.  Family education completed with pt's daughter.   Reasons goals not met: Pt had all unmet goals set at a Modified independent level but pt needs supervision Due to right inattention and decreased safety awareness,   Recommendation:  No further OT services needed.   Equipment: No equipment provided  Reasons for discharge: treatment goals met  Patient/family agrees with progress made and goals achieved: Yes  OT Discharge Precautions/Restrictions  Precautions Precautions: Fall Precaution Comments: mild R inattention and hemipareisis that has improved since evaluation Restrictions Weight Bearing Restrictions: No  ADL ADL Eating: Independent Grooming: Supervision/safety Where Assessed-Grooming: Standing at sink Upper Body Bathing: Supervision/safety Where Assessed-Upper Body Bathing: Shower Lower Body Bathing: Supervision/safety Where Assessed-Lower Body Bathing: Shower Upper Body Dressing: Supervision/safety, Minimal cueing Where Assessed-Upper Body Dressing: Edge of bed Lower Body Dressing: Supervision/safety, Minimal cueing Where Assessed-Lower Body Dressing: Edge of bed Toileting: Supervision/safety Where Assessed-Toileting: Teacher, adult education: Close supervision Toilet Transfer Method: Event organiser: Close  supervision Film/video editor Method: Designer, industrial/product: Emergency planning/management officer, Grab bars Vision Baseline Vision/History: 1 Wears glasses Patient Visual Report: No change from baseline Tracking/Visual Pursuits: Decreased smoothness of horizontal tracking Saccades: Decreased speed of saccadic movement Visual Fields: No apparent deficits Arts development officer Comments: mild Right inattention Praxis Praxis: WFL Cognition Cognition Overall Cognitive Status: Impaired/Different from baseline Arousal/Alertness: Awake/alert Memory: Impaired Memory Impairment: Storage deficit;Retrieval deficit;Decreased recall of new information Sustained Attention: Appears intact Selective Attention: Appears intact Awareness: Impaired Awareness Impairment: Intellectual impairment Problem Solving: Impaired Problem Solving Impairment: Verbal basic;Functional basic Self Correcting: Impaired Self Correcting Impairment: Verbal basic;Functional basic Brief Interview for Mental Status (BIMS) Repetition of Three Words (First Attempt): 3 Temporal Orientation: Year: Correct Temporal Orientation: Month: Accurate within 5 days Temporal Orientation: Day: Correct Recall: "Sock": Yes, no cue required Recall: "Blue": Yes, no cue required Recall: "Bed": Yes, no cue required BIMS Summary Score: 15 Sensation Sensation Light Touch: Appears Intact Coordination Heel Shin Test: [facility] but slower to complete with RLE Motor   Mild R hemiparesis (RUE functional) Mobility    Supervision with RW Trunk/Postural Assessment    Slight R lean in sit and standing Balance  Supervision with RW  Extremity/Trunk Assessment RUE Assessment General Strength Comments: 4+/5 . intact coordination for LUE Assessment LUE Assessment: Within Functional Limits   Harshan Kearley 06/21/2023, 12:50 PM

## 2023-06-21 NOTE — Progress Notes (Addendum)
Patient ID: Sara Donaldson, female   DOB: 05-Jul-1930, 89 y.o.   MRN: [id number]  Patient and daughter have a preference for [facility]. [facility] referral sent to [facility].   12:10 PM: Patient approved for PT/OT

## 2023-06-21 NOTE — Progress Notes (Signed)
Occupational Therapy Session Note  Patient Details  Name: Sara Donaldson MRN: [id number] Date of Birth: 25-Oct-1929  Today's Date: 06/21/2023 OT Individual Time: 0930-1030 OT Individual Time Calculation (min): 60 min    Short Term Goals: Week 1:  OT Short Term Goal 1 (Week 1): STG=LTG  Skilled Therapeutic Interventions/Progress Updates:    Pt seen this session for family education with her daughter.  Daughter observed how to provide close supervision (at arm's length)  to guard pt as she completed bed mobility, sit to stands (focusing on foot positioning),  ambulating with RW to shower. Pt able to step over shower threshold.   Discussed seat for home. Pt had told me that she has a seat,  but daughter confirmed it is a built in tile seat that is low and she will need a shower seat. Discussed purchasing options and recommended pt buy one on her own.    Pt completed shower and then dried off and ambulated to bed to dress. Sitting EOB she had a slight R lean and tended to keep R foot pulled close to L, cued pt to keep a wider BOS for stability.  Pt also continues to try to stand to doff pants over her feet, needs cues to stay seated.   Discussed the level of supervision needed due to slight inattention to R and safety cues needed.  Daughter understood and did repeat demonstration of pt ambulating to bathroom.  Pt resting in bed with all needs met and alarm set.     Therapy Documentation Precautions:  Precautions Precautions: Fall Precaution Comments: mild R inattention and hemipareisis that has improved since evaluation Restrictions Weight Bearing Restrictions: No    Pain: Pain Assessment Pain Scale: 0-10 Pain Score: 0-No pain ADL: ADL Eating: Independent Grooming: Supervision/safety Where Assessed-Grooming: Standing at sink Upper Body Bathing: Supervision/safety Where Assessed-Upper Body Bathing: Shower Lower Body Bathing: Supervision/safety Where Assessed-Lower Body Bathing:  Shower Upper Body Dressing: Supervision/safety, Minimal cueing Where Assessed-Upper Body Dressing: Edge of bed Lower Body Dressing: Supervision/safety, Minimal cueing Where Assessed-Lower Body Dressing: Edge of bed Toileting: Supervision/safety Where Assessed-Toileting: Teacher, adult education: Close supervision Statistician Method: Event organiser: Close supervision Film/video editor Method: Designer, industrial/product: Emergency planning/management officer, Grab bars    Therapy/Group: Individual Therapy  Jesiel Garate 06/21/2023, 12:35 PM

## 2023-06-21 NOTE — Progress Notes (Signed)
Physical Therapy Discharge Summary  Patient Details  Name: Sara Donaldson MRN: [id number] Date of Birth: 07-19-1929  Date of Discharge from PT service:June 21, 2023  Patient has met 7 of 9 long term goals due to improved activity tolerance, improved balance, improved postural control, increased strength, and improved coordination.  Patient to discharge at an ambulatory level Supervision.   Patient's care partner is independent to provide the necessary cognitive assistance at discharge.  Reasons goals not met: Pt has not met goal of modI with assistance device with transfers (bed<>chair and furniture) due to still requiring VC for safe DME management. Pt and pt daughter educated on maintaining safe proximity of RW after d/c with both pt and daughter understanding.   Recommendation:  Patient will benefit from ongoing skilled PT services in home health setting to continue to advance safe functional mobility, address ongoing impairments in ***, and minimize fall risk.  Equipment: No equipment provided  Reasons for discharge: {Reason for discharge:[id number]}  Patient/family agrees with progress made and goals achieved: Yes  PT Discharge Precautions/Restrictions Precautions Precautions: Fall Precaution Comments: mild R inattention and hemipareisis that has improved since evaluation Restrictions Weight Bearing Restrictions: No Vital Signs Therapy Vitals Temp: 98.3 F (36.8 C) Pulse Rate: 61 Resp: 16 BP: (!) 118/50 Patient Position (if appropriate): Sitting Oxygen Therapy SpO2: 94 % O2 Device: Room Air Pain Pain Assessment Pain Scale: 0-10 Pain Score: 0-No pain Pain Interference Pain Interference Pain Effect on Sleep: 1. Rarely or not at all Pain Interference with Therapy Activities: 1. Rarely or not at all Pain Interference with Day-to-Day Activities: 1. Rarely or not at all Vision/Perception  Vision - Assessment Tracking/Visual Pursuits: Decreased smoothness of horizontal  tracking Saccades: Decreased speed of saccadic movement Perception Preception Impairment Details: Inattention/Neglect Perception-Other Comments: mild Right inattention Praxis Praxis: WFL  Cognition Overall Cognitive Status: Impaired/Different from baseline Arousal/Alertness: Awake/alert Orientation Level: Oriented X4 Sustained Attention: Appears intact Selective Attention: Appears intact Memory: Impaired Memory Impairment: Storage deficit;Retrieval deficit;Decreased recall of new information Awareness: Impaired Awareness Impairment: Intellectual impairment Problem Solving: Impaired Problem Solving Impairment: Verbal basic;Functional basic Self Correcting: Impaired Self Correcting Impairment: Verbal basic;Functional basic Sensation Sensation Light Touch: Appears Intact Coordination Gross Motor Movements are Fluid and Coordinated: Yes Fine Motor Movements are Fluid and Coordinated: Yes Motor  Motor Motor - Discharge Observations: mild R hemipareisis that has improved since evaluation  Mobility Bed Mobility Bed Mobility: Supine to Sit;Sit to Supine Supine to Sit: Supervision/Verbal cueing Sit to Supine: Supervision/Verbal cueing Transfers Transfers: Stand to Sit;Sit to Stand;Stand Pivot Transfers Sit to Stand: Supervision/Verbal cueing Stand to Sit: Supervision/Verbal cueing Stand Pivot Transfers: Supervision/Verbal cueing Stand Pivot Transfer Details: Verbal cues for safe use of DME/AE;Verbal cues for technique Transfer (Assistive device): Rolling walker Locomotion  Gait Ambulation: Yes Gait Assistance: Supervision/Verbal cueing Gait Distance (Feet): 300 Feet Assistive device: Rolling walker Gait Gait: Yes Gait Pattern: Impaired Gait Pattern: Poor foot clearance - right;Poor foot clearance - left Gait velocity: decreased but has improved since evaluation Stairs / Additional Locomotion Stairs: Yes Stairs Assistance: Supervision/Verbal cueing Stair Management  Technique: Two rails;Alternating pattern;Step to pattern Number of Stairs: 4 Height of Stairs: 6 Wheelchair Mobility Wheelchair Mobility: No  Trunk/Postural Assessment  Cervical Assessment Cervical Assessment: Exceptions to [facility] Thoracic Assessment Thoracic Assessment: Exceptions to [facility] (rounded shoulders) Lumbar Assessment Lumbar Assessment: Exceptions to [facility] Postural Control Postural Control: Within Functional Limits  Balance Balance Balance Assessed: Yes Standardized Balance Assessment Standardized Balance Assessment: Timed Up and Go Test Timed Up and Go  Test TUG: Normal TUG Normal TUG (seconds): 19.43 Static Sitting Balance Static Sitting - Balance Support: No upper extremity supported;Feet supported Static Sitting - Level of Assistance: 5: Stand by assistance Dynamic Standing Balance Dynamic Standing - Balance Support: During functional activity;No upper extremity supported Dynamic Standing - Level of Assistance: 5: Stand by assistance Extremity Assessment      RLE Assessment RLE Assessment: Within Functional Limits LLE Assessment LLE Assessment: Within Functional Limits   Kallan Merrick PTA  06/21/2023, 3:45 PM

## 2023-06-21 NOTE — Plan of Care (Signed)
  Problem: RH Problem Solving Goal: LTG Patient will demonstrate problem solving for (SLP) Description: LTG:  Patient will demonstrate problem solving for basic/complex daily situations with cues  (SLP) Outcome: Not Met (Continues to require modA)   Problem: RH Memory Goal: LTG Patient will use memory compensatory aids to (SLP) Description: LTG:  Patient will use memory compensatory aids to recall biographical/new, daily complex information with cues (SLP) Outcome: Not Met (Continues to require modA)   Problem: RH Awareness Goal: LTG: Patient will demonstrate awareness during functional activites type of (SLP) Description: LTG: Patient will demonstrate awareness during functional activites type of (SLP) Outcome: Not Met (Continues to require modA)   Problem: RH Expression Communication Goal: LTG Patient will increase speech intelligibility (SLP) Description: LTG: Patient will increase speech intelligibility at word/phrase/conversation level with cues, % of the time (SLP) Outcome: Completed/Met Goal: LTG Patient will increase word finding of common (SLP) Description: LTG:  Patient will increase word finding of common objects/daily info/abstract thoughts with cues using compensatory strategies (SLP). Outcome: Completed/Met

## 2023-06-22 DIAGNOSIS — I63512 Cerebral infarction due to unspecified occlusion or stenosis of left middle cerebral artery: Secondary | ICD-10-CM | POA: Diagnosis not present

## 2023-06-22 NOTE — Progress Notes (Signed)
PROGRESS NOTE   Subjective/Complaints:  Slept well , looking forward to d/c Pt states Dr Algie Coffer is her PCP    ROS: Patient denies CP, SOB, N/V/D   Objective:   No results found. No results for input(s): "WBC", "HGB", "HCT", "PLT" in the last 72 hours.  No results for input(s): "NA", "K", "CL", "CO2", "GLUCOSE", "BUN", "CREATININE", "CALCIUM" in the last 72 hours.  No intake or output data in the 24 hours ending 06/22/23 0802       Physical Exam: Vital Signs Blood pressure 138/79, pulse 78, temperature 97.8 F (36.6 C), resp. rate 18, height 5\' 1"  (1.549 m), weight 63 kg, SpO2 98%.  General: No acute distress Mood and affect are appropriate Heart: Regular rate and rhythm no rubs murmurs or extra sounds Lungs: Clear to auscultation, breathing unlabored, no rales or wheezes Abdomen: Positive bowel sounds, soft nontender to palpation, nondistended Extremities: No clubbing, cyanosis, or edema Skin: No evidence of breakdown, no evidence of rash Neurologic: , mild anomia,  motor strength is 5/5 in left and 5-/5 Right deltoid, bicep, tricep, grip,5/5 , 5- Bilateral  hip flexor, knee extensors, ankle dorsiflexor and plantar flexor  Musculoskeletal: Full range of motion in all 4 extremities. No joint swelling   Assessment/Plan: 1. Functional deficits due to left MCA infarct Stable for D/C today F/u Cardiology  in 1-2 weeks F/u PM&R 3-4 weeks F/u Neuro 1-2 mo  See D/C summary See D/C instructions   Care Tool:  Bathing    Body parts bathed by patient: Right arm, Left arm, Chest, Abdomen, Front perineal area, Buttocks, Right upper leg, Left upper leg, Right lower leg, Left lower leg, Face         Bathing assist Assist Level: Contact Guard/Touching assist     Upper Body Dressing/Undressing Upper body dressing   What is the patient wearing?: Pull over shirt    Upper body assist Assist Level: Contact  Guard/Touching assist    Lower Body Dressing/Undressing Lower body dressing      What is the patient wearing?: Underwear/pull up, Pants     Lower body assist Assist for lower body dressing: Minimal Assistance - Patient > 75%     Toileting Toileting    Toileting assist Assist for toileting: Supervision/Verbal cueing     Transfers Chair/bed transfer  Transfers assist     Chair/bed transfer assist level: Contact Guard/Touching assist     Locomotion Ambulation   Ambulation assist      Assist level: Contact Guard/Touching assist Assistive device: Walker-rolling Max distance: 130 ft   Walk 10 feet activity   Assist     Assist level: Minimal Assistance - Patient > 75% Assistive device: No Device   Walk 50 feet activity   Assist    Assist level: Contact Guard/Touching assist Assistive device: Walker-rolling    Walk 150 feet activity   Assist    Assist level: Contact Guard/Touching assist Assistive device: Walker-rolling    Walk 10 feet on uneven surface  activity   Assist Walk 10 feet on uneven surfaces activity did not occur: Safety/medical concerns         Wheelchair     Assist Is the  patient using a wheelchair?: Yes Type of Wheelchair: Manual    Wheelchair assist level: Dependent - Patient 0% Max wheelchair distance: 150 ft    Wheelchair 50 feet with 2 turns activity    Assist        Assist Level: Dependent - Patient 0%   Wheelchair 150 feet activity     Assist      Assist Level: Dependent - Patient 0%   Blood pressure 138/79, pulse 78, temperature 97.8 F (36.6 C), resp. rate 18, height 5\' 1"  (1.549 m), weight 63 kg, SpO2 98%.  Medical Problem List and Plan: 1. Functional deficits secondary to left MCA infarct with left MCA M1 occlusion status post thrombectomy- mild aphasia , mild LUE weakness              -patient may  shower             -ELOS/Goals: 12/6 Mod I to sup with PT/OT/SLP-              -Continue CIR therapies including PT, OT, and SLP Will not need PMR f/u Needs to f/u with Neuro and PCP 2.  Antithrombotics: -DVT/anticoagulation:  Pharmaceutical: Eliquis 2.5 mg twice daily, Eliquis lifelong             -antiplatelet therapy: Aspirin 81 mg daily 3. Pain Management: Tylenol as needed 4. Mood/Behavior/Sleep: Xanax 0.25 mg nightly as needed             -antipsychotic agents: N/A  -11/30 Melatonin PRN insomnia   12/1 insomnia improved continue current regimen 5. Neuropsych/cognition: This patient is capable of making decisions on her own behalf. 6. Skin/Wound Care: Routine skin checks 7. Fluids/Electrolytes/Nutrition: encourage PO  I personally reviewed the patient's labs today.  WNL  -protein supp for low albumin 8.  Atrial fibrillation as well as history of aortic valve repair.  .  Cardiac rate controlled.  Continue Eliquis as directed             -Consider clarifying with cardiology plan for once a day diltiazem?  -11/29 HR in 80's to 90's   -cardiology changed to diltiazem CD 240mg  qd and increased metoprolol to 50mg  bid   Appreciate cardiology assist 9.  Hyperlipidemia.  Pravachol.  No high intensity statin due to LDL at goal. Heart healthy diet 10.  Hypertension.  Long-term goal normotensive.  Home medications amlodipine 5 mg(d/c ed), Lasix 20 mg (d/c ed) , hydralazine 25 mg (d/ced) , lisinopril 5 mg (d/c ed), losartan 100 mg                06/22/2023    5:04 AM 06/21/2023    8:22 PM 06/21/2023    1:29 PM  Vitals with BMI  Systolic 138 135 361  Diastolic 79 76 50  Pulse 78 81 61   Losartan 100mg  every day restarted per cardiology on 12/1 Cardizem CD 300mg  every day Metoprolol 50mg  BID   11.  Insomnia-chronic not responsive to melatonin, will not rx  alprazolam for home use  LOS: 9 days A FACE TO FACE EVALUATION WAS PERFORMED  Erick Colace 06/22/2023, 8:02 AM

## 2023-06-22 NOTE — Progress Notes (Signed)
Inpatient Rehabilitation Care Coordinator Discharge Note   Patient Details  Name: Sara Donaldson MRN: [id number] Date of Birth: 1930-01-26   Discharge location: Home with daughter  Length of Stay: 9 days  Discharge activity level: supervision  Home/community participation: daughter and granddaughter  Patient response GE:XBMWUX Literacy - How often do you need to have someone help you when you read instructions, pamphlets, or other written material from your doctor or pharmacy?: Sometimes  Patient response LK:GMWNUU Isolation - How often do you feel lonely or isolated from those around you?: Never  Services provided included: SW, Neuropsych, Pharmacy, TR, CM, RN, SLP, OT, PT, RD, MD  Financial Services:  Financial Services Utilized: Medicare    Choices offered to/list presented to: pt and daughter  Follow-up services arranged:  Home Health, DME Home Health Agency: [facility] pt/ot    DME : none reccomended    Patient response to transportation need: Is the patient able to respond to transportation needs?: Yes In the past 12 months, has lack of transportation kept you from medical appointments or from getting medications?: No In the past 12 months, has lack of transportation kept you from meetings, work, or from getting things needed for daily living?: No   Patient/Family verbalized understanding of follow-up arrangements:  Yes  Individual responsible for coordination of the follow-up plan: Kendal Hymen [phone]  Confirmed correct DME delivered: MARGE LICHLITER 06/22/2023    Comments (or additional information):  Summary of Stay    Date/Time Discharge Planning CSW  06/19/23 1426 Discharging home with assistance from daughter and granddaughter, 24/7. 1 level home, 1 step. CJB       Andria Rhein

## 2023-06-22 NOTE — Plan of Care (Signed)
  Problem: Consults Goal: RH STROKE PATIENT EDUCATION Description: See Patient Education module for education specifics  Outcome: Progressing   Problem: RH BOWEL ELIMINATION Goal: RH STG MANAGE BOWEL WITH ASSISTANCE Description: STG Manage Bowel with min Assistance. Outcome: Progressing   Problem: RH BLADDER ELIMINATION Goal: RH STG MANAGE BLADDER WITH ASSISTANCE Description: STG Manage Bladder With min Assistance Outcome: Progressing   Problem: RH SKIN INTEGRITY Goal: RH STG SKIN FREE OF INFECTION/BREAKDOWN Description: Incision will remain intact and free of infection/breakdown with min assist  Outcome: Progressing Goal: RH STG ABLE TO PERFORM INCISION/WOUND CARE W/ASSISTANCE Description: STG Able To Perform Incision/Wound Care With min Assistance. Outcome: Progressing   Problem: RH SAFETY Goal: RH STG ADHERE TO SAFETY PRECAUTIONS W/ASSISTANCE/DEVICE Description: STG Adhere to Safety Precautions With min Assistance/Device. Outcome: Progressing Goal: RH STG DECREASED RISK OF FALL WITH ASSISTANCE Description: STG Decreased Risk of Fall With min Assistance. Outcome: Progressing   Problem: RH PAIN MANAGEMENT Goal: RH STG PAIN MANAGED AT OR BELOW PT'S PAIN GOAL Description: Less than 4 with PRN medications Outcome: Progressing   Problem: RH KNOWLEDGE DEFICIT Goal: RH STG INCREASE KNOWLEDGE OF STROKE PROPHYLAXIS Description: Patient/caregiver will be able to manage new medications related to stroke and Afib from nursing education, nursing handouts and other resources independently  Outcome: Progressing

## 2023-06-22 NOTE — Progress Notes (Signed)
Patient ID: Sara Donaldson, female   DOB: 06/09/1930, 89 y.o.   MRN: [id number]  SW met with patient and daughter to discuss discharge questions and concerns. SW discussed HH, medications and appointments. No additional questions or concerns.

## 2023-06-25 NOTE — Plan of Care (Signed)
  Problem: RH Bed to Chair Transfers Goal: LTG Patient will perform bed/chair transfers w/assist (PT) Description: LTG: Patient will perform bed to chair transfers with assistance (PT). Outcome: Adequate for Discharge Flowsheets (Taken 06/25/2023 1019) LTG: Pt will perform Bed to Chair Transfers with assistance level: Supervision/Verbal cueing   Problem: RH Furniture Transfers Goal: LTG Patient will perform furniture transfers w/assist (OT/PT) Description: LTG: Patient will perform furniture transfers  with assistance (OT/PT). Outcome: Adequate for Discharge Flowsheets (Taken 06/25/2023 1019) LTG: Pt will perform furniture transfers with assist:: Supervision/Verbal cueing

## 2023-07-17 ENCOUNTER — Encounter: Payer: Self-pay | Admitting: Physical Medicine & Rehabilitation

## 2023-07-17 ENCOUNTER — Encounter: Payer: Medicare Other | Attending: Physical Medicine & Rehabilitation | Admitting: Physical Medicine & Rehabilitation

## 2023-07-17 VITALS — BP 128/80 | HR 95 | Ht 61.0 in | Wt 136.0 lb

## 2023-07-17 DIAGNOSIS — I69398 Other sequelae of cerebral infarction: Secondary | ICD-10-CM | POA: Insufficient documentation

## 2023-07-17 DIAGNOSIS — R269 Unspecified abnormalities of gait and mobility: Secondary | ICD-10-CM | POA: Insufficient documentation

## 2023-07-17 DIAGNOSIS — I63512 Cerebral infarction due to unspecified occlusion or stenosis of left middle cerebral artery: Secondary | ICD-10-CM | POA: Diagnosis present

## 2023-07-17 NOTE — Progress Notes (Signed)
 Subjective:    Patient ID: Sara Donaldson, female    DOB: 1929/09/27, 89 y.o.   MRN: [id number] 89 y.o. right-handed female with history significant for hypertension, obesity with BMI 27.33, tachycardia, anxiety, aortic stenosis with aortic valve replacement 03/02/2024 maintained on aspirin  daily.  Per chart review patient lives alone independent prior to admission.  She has excellent support in the area.  Presented 06/10/2023 after being found down by her daughter with right sided weakness as well as right gaze preference with dysarthria.  Admission chemistries unremarkable except WBC 14,000 glucose 153 hemoglobin A1c 5.4.  Cranial CT scan showed hyperdense left MCA and evidence of left basal ganglia cytotoxic edema.  No acute hemorrhage identified.  CTA of head and neck positive for left MCA M1 occlusion.  Generalized arterial tortuosity in the head and neck.  Bilateral carotid atherosclerosis with up to 50% stenosis on the left.  No significant posterior circulation stenosis.  Underwent thrombectomy of left M1 occlusion per interventional radiology 11/24.  MRI follow-up 06/11/2023 shows acute infarction in the left putamen, anterior greater than posterior and left caudate head.  Additional small cortical infarct in the more superior left frontal lobe.  Echocardiogram with ejection fraction of 55 to 60% no wall motion abnormalities.  Hospital course cardiology consulted 11/26 after telemetry monitor showed atrial fibrillation with moderate ventricular response.  She was placed on Cardizem  as well as low-dose beta-blocker.  She remained on low-dose aspirin  with the addition of Eliquis .  Tolerating a regular diet.  Plan for lifelong Eliquis  treatment.  Therapy evaluations completed due to patient decreased functional ability right-sided weakness was admitted for a comprehensive rehab program.  Admit date: 06/13/2023 Discharge date: 06/22/2023 HPI  Staying in own home with daughter  HHPT and OT have been out   Still having some problems finding words , SLP has not been out yet  Some Right ankle swelling  Balance still not good but no falls at home   Needs help with Dressing and bathing , donning pants,  Supervision with shower transfers and showering , needs help with drying off  Pain Inventory Average Pain 0 Pain Right Now 0 My pain is  none  LOCATION OF PAIN  leg  BOWEL Number of stools per week: 7   BLADDER Normal    Mobility walk with assistance use a cane use a walker how many minutes can you walk? 15 ability to climb steps?  no do you drive?  no  Function retired I need assistance with the following:  bathing, toileting, meal prep, household duties, and shopping  Neuro/Psych trouble walking confusion loss of taste or smell  Prior Studies Any changes since last visit?  no  Physicians involved in your care Any changes since last visit?  no   Family History  Problem Relation Age of Onset   Heart disease Mother    Heart disease Father    Hypertension Other    Social History   Socioeconomic History   Marital status: Widowed    Spouse name: Not on file   Number of children: Not on file   Years of education: Not on file   Highest education level: Not on file  Occupational History   Not on file  Tobacco Use   Smoking status: Never   Smokeless tobacco: Never  Substance and Sexual Activity   Alcohol  use: No   Drug use: No   Sexual activity: Not on file  Other Topics Concern   Not on file  Social History Narrative   Not on file   Social Drivers of Health   Financial Resource Strain: Not on file  Food Insecurity: No Food Insecurity (06/13/2023)   Hunger Vital Sign    Worried About Running Out of Food in the Last Year: Never true    Ran Out of Food in the Last Year: Never true  Transportation Needs: No Transportation Needs (06/13/2023)   PRAPARE - Administrator, Civil Service (Medical): No    Lack of Transportation (Non-Medical):  No  Physical Activity: Not on file  Stress: Not on file  Social Connections: Not on file   Past Surgical History:  Procedure Laterality Date   AORTIC VALVE REPLACEMENT N/A 03/02/2014   Procedure: AORTIC VALVE REPLACEMENT (AVR);  Surgeon: Elspeth JAYSON Millers, MD;  Location: [facility] OR;  Service: Open Heart Surgery;  Laterality: N/A;   CARDIAC CATHETERIZATION     4/10 2015   EYE SURGERY     IR CT HEAD LTD  06/10/2023   IR PERCUTANEOUS ART THROMBECTOMY/INFUSION INTRACRANIAL INC DIAG ANGIO  06/10/2023   LEFT HEART CATHETERIZATION WITH CORONARY ANGIOGRAM N/A 10/24/2013   Procedure: LEFT HEART CATHETERIZATION WITH CORONARY ANGIOGRAM;  Surgeon: Salena GORMAN Negri, MD;  Location: [facility] CATH LAB;  Service: Cardiovascular;  Laterality: N/A;   RADIOLOGY WITH ANESTHESIA N/A 06/10/2023   Procedure: IR WITH ANESTHESIA;  Surgeon: Radiologist, Medication, MD;  Location: [facility] OR;  Service: Radiology;  Laterality: N/A;   TEE WITHOUT CARDIOVERSION N/A 01/12/2014   Procedure: TRANSESOPHAGEAL ECHOCARDIOGRAM (TEE);  Surgeon: Salena GORMAN Negri, MD;  Location: [facility] ENDOSCOPY;  Service: Cardiovascular;  Laterality: N/A;   Past Medical History:  Diagnosis Date   Aortic stenosis    Bronchitis    Heart murmur    Hypercholesterolemia    Hypertension    Thrombocytopenia (HCC) 03/05/2014   Ht 5' 1 (1.549 m)   Wt 136 lb (61.7 kg)   BMI 25.70 kg/m   Opioid Risk Score:   Fall Risk Score:  `1  Depression screen [facility] 2/9     07/17/2023   10:22 AM  Depression screen PHQ 2/9  Decreased Interest 1  Down, Depressed, Hopeless 0  PHQ - 2 Score 1  Altered sleeping 0  Tired, decreased energy 1  Change in appetite 1  Feeling bad or failure about yourself  0  Trouble concentrating 1  Moving slowly or fidgety/restless 0  Suicidal thoughts 0  PHQ-9 Score 4  Difficult doing work/chores Not difficult at all      Review of Systems  Musculoskeletal:  Positive for gait problem.  All other systems reviewed and are negative.      Objective:   Physical Exam  Remembers 1/3 words after a delay General No acute distress Mood and affect are appropriate Speech with mild dysarthria Patient ambulates with a cane but can go short distances indoors without a cane wide-based support no evidence of toe drag or knee instability Motor strength is 5/5 bilateral deltoid, biceps, triceps, grip 4/5 in the right hip flexor knee extensor ankle dorsiflexor 5/5 on the left side Tone is normal bilateral upper and lower limbs There is mild word finding deficits she is able to name glasses but is unable to name stethoscope     Assessment & Plan:  #1.  History of left MCA infarct her upper extremity weakness has largely resolved, she still has mild right lower extremity weakness as well as gait disorder.  She also has mild residual aphasia.  Continue home health  therapy Given that she is functioning at a supervision to modified and, do not think she needs further physical medicine rehab follow-up She will follow-up with cardiology as well as neurology.

## 2023-07-26 ENCOUNTER — Other Ambulatory Visit: Payer: Self-pay

## 2023-07-26 ENCOUNTER — Emergency Department (HOSPITAL_COMMUNITY)
Admission: EM | Admit: 2023-07-26 | Discharge: 2023-07-26 | Disposition: A | Discharge status: Home / Self Care | Payer: Medicare Other | Attending: Student | Admitting: Student

## 2023-07-26 ENCOUNTER — Emergency Department (HOSPITAL_COMMUNITY): Payer: Medicare Other

## 2023-07-26 DIAGNOSIS — I10 Essential (primary) hypertension: Secondary | ICD-10-CM | POA: Insufficient documentation

## 2023-07-26 DIAGNOSIS — Z79899 Other long term (current) drug therapy: Secondary | ICD-10-CM | POA: Diagnosis not present

## 2023-07-26 DIAGNOSIS — Z7901 Long term (current) use of anticoagulants: Secondary | ICD-10-CM | POA: Insufficient documentation

## 2023-07-26 DIAGNOSIS — Z7982 Long term (current) use of aspirin: Secondary | ICD-10-CM | POA: Diagnosis not present

## 2023-07-26 DIAGNOSIS — W19XXXA Unspecified fall, initial encounter: Secondary | ICD-10-CM | POA: Diagnosis not present

## 2023-07-26 DIAGNOSIS — R531 Weakness: Secondary | ICD-10-CM | POA: Insufficient documentation

## 2023-07-26 DIAGNOSIS — Z8673 Personal history of transient ischemic attack (TIA), and cerebral infarction without residual deficits: Secondary | ICD-10-CM | POA: Insufficient documentation

## 2023-07-26 LAB — CBC WITH DIFFERENTIAL/PLATELET
Abs Immature Granulocytes: 0.04 10*3/uL (ref 0.00–0.07)
Basophils Absolute: 0.1 10*3/uL (ref 0.0–0.1)
Basophils Relative: 1 %
Eosinophils Absolute: 0.1 10*3/uL (ref 0.0–0.5)
Eosinophils Relative: 1 %
HCT: 47.1 % — ABNORMAL HIGH (ref 36.0–46.0)
Hemoglobin: 15 g/dL (ref 12.0–15.0)
Immature Granulocytes: 0 %
Lymphocytes Relative: 10 %
Lymphs Abs: 1.1 10*3/uL (ref 0.7–4.0)
MCH: 29 pg (ref 26.0–34.0)
MCHC: 31.8 g/dL (ref 30.0–36.0)
MCV: 91.1 fL (ref 80.0–100.0)
Monocytes Absolute: 0.7 10*3/uL (ref 0.1–1.0)
Monocytes Relative: 7 %
Neutro Abs: 8.9 10*3/uL — ABNORMAL HIGH (ref 1.7–7.7)
Neutrophils Relative %: 81 %
Platelets: 188 10*3/uL (ref 150–400)
RBC: 5.17 MIL/uL — ABNORMAL HIGH (ref 3.87–5.11)
RDW: 13.2 % (ref 11.5–15.5)
WBC: 10.9 10*3/uL — ABNORMAL HIGH (ref 4.0–10.5)
nRBC: 0 % (ref 0.0–0.2)

## 2023-07-26 LAB — BASIC METABOLIC PANEL WITH GFR
Anion gap: 10 (ref 5–15)
BUN: 12 mg/dL (ref 8–23)
CO2: 25 mmol/L (ref 22–32)
Calcium: 9.4 mg/dL (ref 8.9–10.3)
Chloride: 104 mmol/L (ref 98–111)
Creatinine, Ser: 0.64 mg/dL (ref 0.44–1.00)
GFR, Estimated: >60 mL/min
Glucose, Bld: 109 mg/dL — ABNORMAL HIGH (ref 70–99)
Potassium: 3.8 mmol/L (ref 3.5–5.1)
Sodium: 139 mmol/L (ref 135–145)

## 2023-07-26 NOTE — Discharge Instructions (Signed)
 The test today did not show any signs of serious injuries associated with her fall.  The MRI did not show any signs of recurrent stroke.  Follow-up with your primary care doctor and consider seeing an orthopedic doctor for further evaluation

## 2023-07-26 NOTE — ED Notes (Signed)
 Patient transported to MRI

## 2023-07-26 NOTE — ED Triage Notes (Signed)
 Pt reports that between 0300 and 0400 she was trying to get out of bed to use the bathroom and slid onto the ground because her L leg locked up. Pt denies head injury/LOC. Family reports that they spoke with her at 0730 and then drove to help her up. They did not notice any unilateral weakness, but stated that she mentioned her L leg hurting.

## 2023-07-26 NOTE — ED Provider Triage Note (Signed)
 Emergency Medicine Provider Triage Evaluation Note  Sara Donaldson , a 89 y.o. female  was evaluated in triage.  Pt complains of fall where she slipped out of bed.  She is on Eliquis .  She states her legs locked up causing her to slip out of bed.  She states she felt her knee and hip lock up.  Patient had recent CVA week before Thanksgiving.  According to family she had some swelling to the left ankle.  Patient denies any pain to the left ankle..  Review of Systems  Positive: As above Negative: As above  Physical Exam  BP (!) 141/86 (BP Location: Left Arm)   Pulse 92   Temp (!) 97.5 F (36.4 C) (Oral)   Resp 18   SpO2 91%  Gen:   Awake, no distress   Resp:  Normal effort  MSK:   Moves extremities without difficulty  Other:    Medical Decision Making  Medically screening exam initiated at 12:26 PM.  Appropriate orders placed.  KATRICE GOEL was informed that the remainder of the evaluation will be completed by another provider, this initial triage assessment does not replace that evaluation, and the importance of remaining in the ED until their evaluation is complete.     Hildegard Loge, PA-C 07/26/23 1227

## 2023-07-26 NOTE — ED Provider Notes (Signed)
 [place] EMERGENCY DEPARTMENT AT [facility] Provider Note   CSN: [id number] Arrival date & time: 07/26/23  1129     History  Chief Complaint  Patient presents with   Sara Donaldson    ARIYAN Donaldson is a 89 y.o. female.   Fall  Pt has history of hypertension hyperlipidemia thrombocytopenia stroke atrial fibrillation.  Pt had a stroke before thanksgiving.  Pt states she has not had any residual weakness.   Pt slipped off the bed this am.  She was not able to get back into the bed and had to lie on the floor.  Her daughter checked on her and was able to help her up.  Pt state she has not tried to walk around since she fell.  Patient states she felt like her left leg locked up when it happened.  She is not having any headache.  No fevers or chills.  No vomiting or diarrhea.  No trouble with her speech or vision    Home Medications Prior to Admission medications   Medication Sig Start Date End Date Taking? Authorizing Provider  acetaminophen  (TYLENOL ) 325 MG tablet Take 2 tablets (650 mg total) by mouth every 4 (four) hours as needed for mild pain (pain score 1-3) (or temp > 37.5 C (99.5 F)). 06/20/23   Angiulli, Toribio PARAS, PA-C  ALPRAZolam  (XANAX ) 0.25 MG tablet Take 1 tablet (0.25 mg total) by mouth at bedtime. 06/21/23   Angiulli, Toribio PARAS, PA-C  apixaban  (ELIQUIS ) 2.5 MG TABS tablet Take 1 tablet (2.5 mg total) by mouth 2 (two) times daily. 06/21/23   Angiulli, Toribio PARAS, PA-C  aspirin  EC 81 MG tablet Take 81 mg by mouth daily. Swallow whole.    [provider]  diltiazem  (CARDIZEM  CD) 300 MG 24 hr capsule Take 1 capsule (300 mg total) by mouth daily. 06/21/23   Angiulli, Toribio PARAS, PA-C  losartan  (COZAAR ) 100 MG tablet Take 1 tablet (100 mg total) by mouth daily. 06/21/23   Angiulli, Toribio PARAS, PA-C  metoprolol  tartrate (LOPRESSOR ) 50 MG tablet Take 1 tablet (50 mg total) by mouth 2 (two) times daily. 06/21/23   Angiulli, Toribio PARAS, PA-C  Multiple Vitamins-Minerals (WOMENS 50+ MULTI  VITAMIN) TABS Take 1 tablet by mouth daily.    [provider]  pravastatin  (PRAVACHOL ) 40 MG tablet Take 1 tablet (40 mg total) by mouth daily at 6 PM. 06/21/23   Angiulli, Toribio PARAS, PA-C      Allergies    Silicone, Tape, and Trazodone and nefazodone    Review of Systems   Review of Systems  Physical Exam Updated Vital Signs BP (!) 162/76   Pulse 84   Temp 98.2 F (36.8 C) (Oral)   Resp 17   SpO2 98%  Physical Exam Vitals and nursing note reviewed.  Constitutional:      General: She is not in acute distress.    Appearance: She is well-developed.  HENT:     Head: Normocephalic and atraumatic.     Right Ear: External ear normal.     Left Ear: External ear normal.  Eyes:     General: No visual field deficit or scleral icterus.       Right eye: No discharge.        Left eye: No discharge.     Conjunctiva/sclera: Conjunctivae normal.  Neck:     Trachea: No tracheal deviation.  Cardiovascular:     Rate and Rhythm: Normal rate and regular rhythm.  Pulmonary:  Effort: Pulmonary effort is normal. No respiratory distress.     Breath sounds: Normal breath sounds. No stridor. No wheezing or rales.  Abdominal:     General: Bowel sounds are normal. There is no distension.     Palpations: Abdomen is soft.     Tenderness: There is no abdominal tenderness. There is no guarding or rebound.  Musculoskeletal:        General: No tenderness or deformity.     Cervical back: Neck supple.  Skin:    General: Skin is warm and dry.     Findings: No rash.  Neurological:     Mental Status: She is alert and oriented to person, place, and time.     Cranial Nerves: No cranial nerve deficit, dysarthria or facial asymmetry.     Sensory: No sensory deficit.     Motor: Weakness present. No abnormal muscle tone, seizure activity or pronator drift.     Coordination: Coordination normal.     Comments:  able to hold both legs off bed for 5 seconds, more difficulty with the left leg,  sensation intact in all extremities,  no left or right sided neglect, normal finger-nose exam bilaterally, no nystagmus noted   Psychiatric:        Mood and Affect: Mood normal.     ED Results / Procedures / Treatments   Labs (all labs ordered are listed, but only abnormal results are displayed) Labs Reviewed  CBC WITH DIFFERENTIAL/PLATELET - Abnormal; Notable for the following components:      Result Value   WBC 10.9 (*)    RBC 5.17 (*)    HCT 47.1 (*)    Neutro Abs 8.9 (*)    All other components within normal limits  BASIC METABOLIC PANEL - Abnormal; Notable for the following components:   Glucose, Bld 109 (*)    All other components within normal limits    EKG EKG Interpretation Date/Time:  Thursday July 26 2023 11:45:28 EST Ventricular Rate:  94 PR Interval:    QRS Duration:  96 QT Interval:  376 QTC Calculation: 470 R Axis:   0  Text Interpretation: Atrial fibrillation Anteroseptal infarct , age undetermined Abnormal ECG When compared with ECG of 11-Jun-2023 09:15, PREVIOUS ECG IS PRESENT No significant change since last tracing Confirmed by Randol Simmonds [phone]) on 07/26/2023 4:21:55 PM  Radiology MR BRAIN WO CONTRAST Result Date: 07/26/2023 CLINICAL DATA:  Stroke suspected EXAM: MRI HEAD WITHOUT CONTRAST TECHNIQUE: Multiplanar, multiecho pulse sequences of the brain and surrounding structures were obtained without intravenous contrast. COMPARISON:  06/11/2023 MRI head, correlation is also made with 07/26/2023 CT head FINDINGS: Brain: No restricted diffusion to suggest acute or subacute infarct. Some T2 shine through is associated with remote left basal ganglia infarcts. No acute hemorrhage, mass, mass effect, or midline shift. No hydrocephalus or extra-axial collection. Hemosiderin deposition, gliosis, and encephalomalacia of in the previously noted left basal ganglia infarct. Additional remote lacunar infarcts in the bilateral cerebellar hemispheres, bilateral basal  ganglia, and periventricular white matter. Confluent T2 hyperintense signal in the periventricular white matter, likely the sequela of severe chronic small vessel ischemic disease. Vascular: Normal arterial flow voids. Skull and upper cervical spine: Normal marrow signal. Sinuses/Orbits: Clear paranasal sinuses. Status post bilateral lens replacements. Other: The mastoid air cells are well aerated. IMPRESSION: No acute intracranial process. No evidence of acute or subacute infarct. Electronically Signed   By: Donald Campion M.D.   On: 07/26/2023 18:49   DG Knee Complete 4 Views  Left Result Date: 07/26/2023 CLINICAL DATA:  Left leg pain after fall. EXAM: LEFT KNEE - COMPLETE 4+ VIEW COMPARISON:  None Available. FINDINGS: No evidence of fracture, dislocation, or joint effusion. No evidence of arthropathy or other focal bone abnormality. Osteopenia. Soft tissues are unremarkable. IMPRESSION: Negative. Electronically Signed   By: Elsie ONEIDA Shoulder M.D.   On: 07/26/2023 13:17   DG Hip Unilat W or Wo Pelvis 2-3 Views Left Result Date: 07/26/2023 CLINICAL DATA:  Left leg pain after fall. EXAM: DG HIP (WITH OR WITHOUT PELVIS) 2-3V LEFT COMPARISON:  CT abdomen pelvis dated May 27, 2018. FINDINGS: There is no evidence of hip fracture or dislocation. There is no evidence of arthropathy or other focal bone abnormality. Osteopenia. IMPRESSION: 1. No acute osseous abnormality. Electronically Signed   By: Elsie ONEIDA Shoulder M.D.   On: 07/26/2023 13:11   CT Head Wo Contrast Result Date: 07/26/2023 CLINICAL DATA:  Provided history: Polytrauma, blunt. Additional history provided: Fall. EXAM: CT HEAD WITHOUT CONTRAST CT CERVICAL SPINE WITHOUT CONTRAST TECHNIQUE: Multidetector CT imaging of the head and cervical spine was performed following the standard protocol without intravenous contrast. Multiplanar CT image reconstructions of the cervical spine were also generated. RADIATION DOSE REDUCTION: This exam was performed according  to the departmental dose-optimization program which includes automated exposure control, adjustment of the mA and/or kV according to patient size and/or use of iterative reconstruction technique. COMPARISON:  Brain MRI 06/11/2023. FINDINGS: CT HEAD FINDINGS: Brain: Generalized cerebral atrophy. Chronic infarcts scattered within the bilateral cerebral white matter. Background moderate-to-advanced patchy and ill-defined hypoattenuation within the cerebral white matter, nonspecific but compatible chronic small vessel ischemic disease. There is no acute intracranial hemorrhage. No demarcated cortical infarct. No extra-axial fluid collection. No evidence of an intracranial mass. No midline shift. Vascular: No hyperdense vessel. Atherosclerotic calcifications. Skull: No calvarial fracture or aggressive osseous lesion. Sinuses/Orbits: No mass or acute finding within the imaged orbits. No significant paranasal sinus disease at the imaged levels. CT CERVICAL SPINE FINDINGS Alignment: Nonspecific straightening of the expected cervical lordosis. No significant spondylolisthesis. Skull base and vertebrae: The basion-dental and atlanto-dental intervals are maintained.No evidence of acute fracture to the cervical spine. Soft tissues and spinal canal: No prevertebral fluid or swelling. No visible canal hematoma. Disc levels: Cervical spondylosis with multilevel disc space narrowing, disc bulges/central disc protrusions and uncovertebral hypertrophy. Disc space narrowing is moderate-to-advanced throughout the cervical spine. No appreciable high-grade spinal canal stenosis. Multilevel bony neural foraminal narrowing. Upper chest: No consolidation within the imaged lung apices. No visible pneumothorax. Prior median sternotomy. IMPRESSION: CT head: 1. No evidence of an acute intracranial abnormality. 2. Parenchymal atrophy, chronic small vessel ischemic disease and chronic infarcts as described. CT cervical spine: 1. No evidence of an  acute cervical spine fracture. 2. Nonspecific straightening of the expected cervical lordosis. 3. Cervical spondylosis as described. Electronically Signed   By: Rockey Childs D.O.   On: 07/26/2023 13:05   CT Cervical Spine Wo Contrast Result Date: 07/26/2023 CLINICAL DATA:  Provided history: Polytrauma, blunt. Additional history provided: Fall. EXAM: CT HEAD WITHOUT CONTRAST CT CERVICAL SPINE WITHOUT CONTRAST TECHNIQUE: Multidetector CT imaging of the head and cervical spine was performed following the standard protocol without intravenous contrast. Multiplanar CT image reconstructions of the cervical spine were also generated. RADIATION DOSE REDUCTION: This exam was performed according to the departmental dose-optimization program which includes automated exposure control, adjustment of the mA and/or kV according to patient size and/or use of iterative reconstruction technique. COMPARISON:  Brain MRI 06/11/2023.  FINDINGS: CT HEAD FINDINGS: Brain: Generalized cerebral atrophy. Chronic infarcts scattered within the bilateral cerebral white matter. Background moderate-to-advanced patchy and ill-defined hypoattenuation within the cerebral white matter, nonspecific but compatible chronic small vessel ischemic disease. There is no acute intracranial hemorrhage. No demarcated cortical infarct. No extra-axial fluid collection. No evidence of an intracranial mass. No midline shift. Vascular: No hyperdense vessel. Atherosclerotic calcifications. Skull: No calvarial fracture or aggressive osseous lesion. Sinuses/Orbits: No mass or acute finding within the imaged orbits. No significant paranasal sinus disease at the imaged levels. CT CERVICAL SPINE FINDINGS Alignment: Nonspecific straightening of the expected cervical lordosis. No significant spondylolisthesis. Skull base and vertebrae: The basion-dental and atlanto-dental intervals are maintained.No evidence of acute fracture to the cervical spine. Soft tissues and spinal  canal: No prevertebral fluid or swelling. No visible canal hematoma. Disc levels: Cervical spondylosis with multilevel disc space narrowing, disc bulges/central disc protrusions and uncovertebral hypertrophy. Disc space narrowing is moderate-to-advanced throughout the cervical spine. No appreciable high-grade spinal canal stenosis. Multilevel bony neural foraminal narrowing. Upper chest: No consolidation within the imaged lung apices. No visible pneumothorax. Prior median sternotomy. IMPRESSION: CT head: 1. No evidence of an acute intracranial abnormality. 2. Parenchymal atrophy, chronic small vessel ischemic disease and chronic infarcts as described. CT cervical spine: 1. No evidence of an acute cervical spine fracture. 2. Nonspecific straightening of the expected cervical lordosis. 3. Cervical spondylosis as described. Electronically Signed   By: Rockey Childs D.O.   On: 07/26/2023 13:05    Procedures Procedures    Medications Ordered in ED Medications - No data to display  ED Course/ Medical Decision Making/ A&P Clinical Course as of 07/26/23 1911  Thu Jul 26, 2023  1341 CT Head Wo Contrast [MK]  1621 CT and C-spine CT without acute injury [JK]  1621 Knee and hip x-rays without acute findings.  CBC and metabolic panel unremarkable. [JK]  1856 MRI does not show any sign of acute stroke [JK]    Clinical Course User Index [JK] Randol Simmonds, MD [MK] Albertina Dixon, MD                                 Medical Decision Making Problems Addressed: Fall, initial encounter: acute illness or injury that poses a threat to life or bodily functions  Amount and/or Complexity of Data Reviewed Labs: ordered. Decision-making details documented in ED Course. Radiology: ordered and independent interpretation performed. Decision-making details documented in ED Course.   Patient's ED workup is reassuring.  She does not have any signs of anemia or dehydration.  She has chronic A-fib but this is unchanged.   Patient does not have any evidence of serious injury with her fall.  Head C-spine CT normal.  Hip and pelvis films do not show any signs of fracture.  I did not appreciate any significant edema or erythema of her lower extremity.  Findings not suggestive of DVT or serious injury.  With her recent stroke and her difficulty moving her leg I was concerned about the possibility of stroke.  MRI was performed and does not show any acute finding.  Patient otherwise appears stable for discharge at this point will have her follow-up with her primary care doctor.  Consider seeing an orthopedic doctor regarding her knee.  I recommend she continue to use her walker for safety        Final Clinical Impression(s) / ED Diagnoses Final diagnoses:  Fall, initial encounter  Rx / DC Orders ED Discharge Orders     None         Randol Simmonds, MD 07/26/23 1911

## 2023-08-13 ENCOUNTER — Emergency Department (HOSPITAL_COMMUNITY)
Admission: EM | Admit: 2023-08-13 | Discharge: 2023-08-14 | Disposition: A | Discharge status: Home / Self Care | Payer: Medicare Other | Attending: Emergency Medicine | Admitting: Emergency Medicine

## 2023-08-13 ENCOUNTER — Emergency Department (HOSPITAL_COMMUNITY): Payer: Medicare Other

## 2023-08-13 DIAGNOSIS — R002 Palpitations: Secondary | ICD-10-CM

## 2023-08-13 DIAGNOSIS — Z79899 Other long term (current) drug therapy: Secondary | ICD-10-CM | POA: Insufficient documentation

## 2023-08-13 DIAGNOSIS — Z7901 Long term (current) use of anticoagulants: Secondary | ICD-10-CM | POA: Insufficient documentation

## 2023-08-13 DIAGNOSIS — Z7982 Long term (current) use of aspirin: Secondary | ICD-10-CM | POA: Insufficient documentation

## 2023-08-13 DIAGNOSIS — I10 Essential (primary) hypertension: Secondary | ICD-10-CM | POA: Insufficient documentation

## 2023-08-13 LAB — BASIC METABOLIC PANEL
Anion gap: 14 (ref 5–15)
BUN: 8 mg/dL (ref 8–23)
CO2: 22 mmol/L (ref 22–32)
Calcium: 9.6 mg/dL (ref 8.9–10.3)
Chloride: 101 mmol/L (ref 98–111)
Creatinine, Ser: 0.54 mg/dL (ref 0.44–1.00)
GFR, Estimated: >60 mL/min (ref >60)
Glucose, Bld: 116 mg/dL — ABNORMAL HIGH (ref 70–99)
Potassium: 3.6 mmol/L (ref 3.5–5.1)
Sodium: 137 mmol/L (ref 135–145)

## 2023-08-13 LAB — CBC WITH DIFFERENTIAL/PLATELET
Abs Immature Granulocytes: 0.06 10*3/uL (ref 0.00–0.07)
Basophils Absolute: 0.1 10*3/uL (ref 0.0–0.1)
Basophils Relative: 1 %
Eosinophils Absolute: 0.3 10*3/uL (ref 0.0–0.5)
Eosinophils Relative: 3 %
HCT: 47.7 % — ABNORMAL HIGH (ref 36.0–46.0)
Hemoglobin: 15.7 g/dL — ABNORMAL HIGH (ref 12.0–15.0)
Immature Granulocytes: 1 %
Lymphocytes Relative: 14 %
Lymphs Abs: 1.5 10*3/uL (ref 0.7–4.0)
MCH: 29.3 pg (ref 26.0–34.0)
MCHC: 32.9 g/dL (ref 30.0–36.0)
MCV: 89.2 fL (ref 80.0–100.0)
Monocytes Absolute: 0.9 10*3/uL (ref 0.1–1.0)
Monocytes Relative: 8 %
Neutro Abs: 7.8 10*3/uL — ABNORMAL HIGH (ref 1.7–7.7)
Neutrophils Relative %: 73 %
Platelets: 187 10*3/uL (ref 150–400)
RBC: 5.35 MIL/uL — ABNORMAL HIGH (ref 3.87–5.11)
RDW: 13.4 % (ref 11.5–15.5)
WBC: 10.6 10*3/uL — ABNORMAL HIGH (ref 4.0–10.5)
nRBC: 0 % (ref 0.0–0.2)

## 2023-08-13 LAB — URINALYSIS, ROUTINE W REFLEX MICROSCOPIC
Bilirubin Urine: NEGATIVE
Glucose, UA: NEGATIVE mg/dL
Hgb urine dipstick: NEGATIVE
Ketones, ur: 5 mg/dL — AB
Nitrite: NEGATIVE
Protein, ur: NEGATIVE mg/dL
Specific Gravity, Urine: 1.01 (ref 1.005–1.030)
pH: 7 (ref 5.0–8.0)

## 2023-08-13 LAB — TROPONIN I (HIGH SENSITIVITY)
Troponin I (High Sensitivity): 9 ng/L (ref <18)
Troponin I (High Sensitivity): 11 ng/L (ref <18)

## 2023-08-13 NOTE — ED Triage Notes (Signed)
Patient's home health nurse called EMS d/t hypertension. Systolic 170 at home. Seen two weeks ago for same and they increased her medication dosage. Patient took blood pressure medication scheduled at 1800 just before leaving home. Patient denies complaints.

## 2023-08-13 NOTE — ED Notes (Signed)
Sara Donaldson [phone]

## 2023-08-13 NOTE — ED Notes (Signed)
Pt unable to void at this time. KM

## 2023-08-13 NOTE — ED Provider Triage Note (Signed)
Emergency Medicine Provider Triage Evaluation Note  Sara Donaldson , a 89 y.o. female  was evaluated in triage.  Pt complains of Elevated BP.  Review of Systems  Positive: Elevated BP Negative: CP, HA, urinary sx, flank pain, N/V, trauma, weakness, SHOB  Physical Exam  BP (!) 166/89   Pulse 88   Temp 98.2 F (36.8 C)   Resp 14   Ht 5\' 1"  (1.549 m)   Wt 61.2 kg   SpO2 95%   BMI 25.51 kg/m  Gen:   Awake, no distress   Resp:  Normal effort  MSK:   Moves extremities without difficulty  Other:    Medical Decision Making  Medically screening exam initiated at 7:15 PM.  Appropriate orders placed.  Sara Donaldson was informed that the remainder of the evaluation will be completed by another provider, this initial triage assessment does not replace that evaluation, and the importance of remaining in the ED until their evaluation is complete.  Labs and imaging ordered   Gretta Began 08/13/23 0865

## 2023-08-14 ENCOUNTER — Telehealth: Payer: Self-pay | Admitting: Cardiovascular Disease

## 2023-08-14 NOTE — ED Notes (Signed)
Pt family member spoke with this NT about pt "just not feeling right."  Triage RN aware.

## 2023-08-14 NOTE — ED Provider Notes (Signed)
[place] EMERGENCY DEPARTMENT AT [facility] Provider Note   CSN: [id number] Arrival date & time: 08/13/23  1843     History  Chief Complaint  Patient presents with   Hypertension    Sara Donaldson is a 89 y.o. female.  Patient is a 89 year old female who presents to the emergency department with a chief complaint of elevated blood pressures which has been ongoing for approximate the past month.  Patient notes that she was evaluated by her cardiologist approximately 1 month ago during which time changes were made to her medications.  She has been compliant with her medications since that time.  She does note that she has been getting some intermittent paresthesias to her left upper extremity but denies any associated weakness.  She has had no associated falls or blunt head trauma.  She denies any abnormal headaches and notes that she has had no chest pain or shortness of breath.  She denies any associated dizziness, lightheadedness, syncope.  She denies any increased lower extremity pain or edema.  Patient denies any associated vision or hearing changes.  The history is provided by the patient.  Hypertension This is a chronic problem.       Home Medications Prior to Admission medications   Medication Sig Start Date End Date Taking? Authorizing Provider  acetaminophen (TYLENOL) 325 MG tablet Take 2 tablets (650 mg total) by mouth every 4 (four) hours as needed for mild pain (pain score 1-3) (or temp > 37.5 C (99.5 F)). 06/20/23   Angiulli, Mcarthur Rossetti, PA-C  ALPRAZolam Prudy Feeler) 0.25 MG tablet Take 1 tablet (0.25 mg total) by mouth at bedtime. 06/21/23   Angiulli, Mcarthur Rossetti, PA-C  apixaban (ELIQUIS) 2.5 MG TABS tablet Take 1 tablet (2.5 mg total) by mouth 2 (two) times daily. 06/21/23   Angiulli, Mcarthur Rossetti, PA-C  aspirin EC 81 MG tablet Take 81 mg by mouth daily. Swallow whole.    [provider]  diltiazem (CARDIZEM CD) 300 MG 24 hr capsule Take 1 capsule (300 mg total) by  mouth daily. 06/21/23   Angiulli, Mcarthur Rossetti, PA-C  losartan (COZAAR) 100 MG tablet Take 1 tablet (100 mg total) by mouth daily. 06/21/23   Angiulli, Mcarthur Rossetti, PA-C  metoprolol tartrate (LOPRESSOR) 50 MG tablet Take 1 tablet (50 mg total) by mouth 2 (two) times daily. 06/21/23   Angiulli, Mcarthur Rossetti, PA-C  Multiple Vitamins-Minerals (WOMENS 50+ MULTI VITAMIN) TABS Take 1 tablet by mouth daily.    [provider]  pravastatin (PRAVACHOL) 40 MG tablet Take 1 tablet (40 mg total) by mouth daily at 6 PM. 06/21/23   Angiulli, Mcarthur Rossetti, PA-C      Allergies    Silicone, Tape, and Trazodone and nefazodone    Review of Systems   Review of Systems  Cardiovascular:        Hypertension  All other systems reviewed and are negative.   Physical Exam Updated Vital Signs BP (!) 173/94   Pulse 91   Temp 98.7 F (37.1 C)   Resp (!) 22   Ht 5\' 1"  (1.549 m)   Wt 61.2 kg   SpO2 97%   BMI 25.51 kg/m  Physical Exam Constitutional:      Appearance: Normal appearance.  HENT:     Head: Normocephalic and atraumatic.     Nose: Nose normal.     Mouth/Throat:     Mouth: Mucous membranes are dry.  Eyes:     Extraocular Movements: Extraocular movements intact.  Conjunctiva/sclera: Conjunctivae normal.     Pupils: Pupils are equal, round, and reactive to light.     Comments: No nystagmus  Cardiovascular:     Pulses: Normal pulses.     Heart sounds: Normal heart sounds.     Comments: Irregularly irregular rhythm, normal rate Pulmonary:     Effort: Pulmonary effort is normal.     Breath sounds: Normal breath sounds.  Abdominal:     General: Abdomen is flat. Bowel sounds are normal.     Palpations: Abdomen is soft.  Musculoskeletal:        General: Normal range of motion.     Cervical back: Normal range of motion and neck supple.     Comments: 1+ edema to right lower extremity  Skin:    General: Skin is warm and dry.  Neurological:     Mental Status: She is alert and oriented to person,  place, and time. Mental status is at baseline.     Comments: Strength 5 out of 5 and left upper and lower extremity, strength 4 out of 5 in right upper and lower extremity, finger-to-nose intact, normal heel-to-shin  Psychiatric:        Mood and Affect: Mood normal.        Behavior: Behavior normal.        Thought Content: Thought content normal.        Judgment: Judgment normal.     ED Results / Procedures / Treatments   Labs (all labs ordered are listed, but only abnormal results are displayed) Labs Reviewed  BASIC METABOLIC PANEL - Abnormal; Notable for the following components:      Result Value   Glucose, Bld 116 (*)    All other components within normal limits  CBC WITH DIFFERENTIAL/PLATELET - Abnormal; Notable for the following components:   WBC 10.6 (*)    RBC 5.35 (*)    Hemoglobin 15.7 (*)    HCT 47.7 (*)    Neutro Abs 7.8 (*)    All other components within normal limits  URINALYSIS, ROUTINE W REFLEX MICROSCOPIC - Abnormal; Notable for the following components:   Ketones, ur 5 (*)    Leukocytes,Ua MODERATE (*)    Bacteria, UA RARE (*)    All other components within normal limits  TROPONIN I (HIGH SENSITIVITY)  TROPONIN I (HIGH SENSITIVITY)    EKG EKG Interpretation Date/Time:  Monday August 13 2023 19:34:36 EST Ventricular Rate:  93 PR Interval:    QRS Duration:  92 QT Interval:  360 QTC Calculation: 447 R Axis:   120  Text Interpretation: Atrial fibrillation Right axis deviation Low voltage QRS Cannot rule out Anteroseptal infarct , age undetermined Abnormal ECG When compared with ECG of 26-Jul-2023 11:45, No significant change since last tracing Confirmed by Benjiman Core [phone]) on 08/14/2023 7:27:48 AM  Radiology DG Chest 2 View Result Date: 08/13/2023 CLINICAL DATA:  Hypertension EXAM: CHEST - 2 VIEW COMPARISON:  12/07/2022 FINDINGS: The heart size and mediastinal contours are within normal limits. Both lungs are clear. The visualized skeletal  structures are unremarkable. Postsurgical changes are again seen. IMPRESSION: No active cardiopulmonary disease. Electronically Signed   By: Alcide Clever M.D.   On: 08/13/2023 21:00    Procedures Procedures    Medications Ordered in ED Medications - No data to display  ED Course/ Medical Decision Making/ A&P  Medical Decision Making Patient is doing well at this time and is stable for discharge home.  Discussed with patient that we will plan for discharge home at this time and continue follow-up with her cardiologist and primary care doctor.  Patient has no clinical indication for underlying etiology such as CVA, TIA, MI, ACS.  Patient is otherwise asymptomatic at this time with no indication for hypertensive emergency or hypertensive urgency.  Blood work has been otherwise unremarkable with no changes in electrolytes, kidney function, liver function.  Do not suspect any further emergent workup is warranted at this time to include imaging.  EKG demonstrated atrial fibrillation with no signs of acute ischemic changes.  The importance of close follow-up on an outpatient basis was discussed as well as strict term cautions for any new or worsening symptoms.  Patient voiced understanding to the plan and had no additional questions. Patient was evaluated by attending physician who is in agreement to plan at this time.            Final Clinical Impression(s) / ED Diagnoses Final diagnoses:  Hypertension, unspecified type  Heart palpitations    Rx / DC Orders ED Discharge Orders     None         Kathlen Mody 08/14/23 7829    Benjiman Core, MD 08/14/23 Theodis Blaze    Benjiman Core, MD 08/14/23 [phone]

## 2023-08-14 NOTE — Telephone Encounter (Signed)
Daughter Sara Donaldson come running in office and said help me, my mother has fallen in parking lot. I went out to find her mother on the sidewalk in front of Dr Algie Coffer office. She had just come from the emergency room, was hoping to find Dr Algie Coffer in his office, they did not have an appointment.  Sara Donaldson appeared to be okay, she was trying to get up but could not get up by herself, and her daughter could not get her up either. We got our wheel chair and got her up and brought her into our office where it was warm and then called Dr Algie Coffer after ours number and let them know what happened. Dr Algie Coffer quickly called back and said he would come and see her in about 15-20 minutes.

## 2023-09-06 ENCOUNTER — Inpatient Hospital Stay: Payer: Medicare Other | Admitting: Neurology

## 2023-09-21 ENCOUNTER — Telehealth: Payer: Self-pay

## 2023-09-21 NOTE — Patient Outreach (Signed)
 First telephone outreach attempt to obtain mRS. No answer. Left message for returned call.  [facility]  Population Health Care Management Assistant  Direct Dial: [phone]  Fax: [phone] Website: [email or web address]

## 2023-09-25 ENCOUNTER — Telehealth: Payer: Self-pay

## 2023-09-25 NOTE — Patient Outreach (Signed)
 Second telephone outreach attempt to obtain mRS. No answer. Left message for returned call.  [facility]  Population Health Care Management Assistant  Direct Dial: [phone]  Fax: [phone] Website: [email or web address]

## 2023-09-26 ENCOUNTER — Telehealth: Payer: Self-pay

## 2023-09-26 NOTE — Patient Outreach (Signed)
 Patient returned call, to obtained mRS successfully. MRS= 1  Sara Donaldson [facility] Health Care Management Assistant  Direct Dial: [phone]  Fax: [phone] Website: [email or web address]
